# Patient Record
Sex: Female | Born: 1935 | Race: Black or African American | Hispanic: No | Smoking: Never smoker
Health system: Southern US, Community
[De-identification: ages and names within clinical notes are randomized; demographics above are authoritative.]

## PROBLEM LIST (undated history)

## (undated) DIAGNOSIS — F0150 Vascular dementia without behavioral disturbance: Secondary | ICD-10-CM

## (undated) DIAGNOSIS — H9190 Unspecified hearing loss, unspecified ear: Secondary | ICD-10-CM

## (undated) DIAGNOSIS — R569 Unspecified convulsions: Secondary | ICD-10-CM

## (undated) DIAGNOSIS — I10 Essential (primary) hypertension: Secondary | ICD-10-CM

## (undated) DIAGNOSIS — R634 Abnormal weight loss: Secondary | ICD-10-CM

## (undated) DIAGNOSIS — F0390 Unspecified dementia without behavioral disturbance: Secondary | ICD-10-CM

---

## 2011-11-29 DEATH — deceased

## 2015-04-20 ENCOUNTER — Emergency Department (HOSPITAL_COMMUNITY)
Admission: EM | Admit: 2015-04-20 | Discharge: 2015-04-22 | Disposition: A | Discharge status: Home / Self Care | Payer: Medicare Other | Attending: Emergency Medicine | Admitting: Emergency Medicine

## 2015-04-20 ENCOUNTER — Encounter (HOSPITAL_COMMUNITY): Payer: Self-pay | Admitting: Emergency Medicine

## 2015-04-20 DIAGNOSIS — G40909 Epilepsy, unspecified, not intractable, without status epilepticus: Secondary | ICD-10-CM | POA: Diagnosis not present

## 2015-04-20 DIAGNOSIS — F0390 Unspecified dementia without behavioral disturbance: Secondary | ICD-10-CM | POA: Diagnosis present

## 2015-04-20 DIAGNOSIS — Z7982 Long term (current) use of aspirin: Secondary | ICD-10-CM | POA: Diagnosis not present

## 2015-04-20 DIAGNOSIS — Z79899 Other long term (current) drug therapy: Secondary | ICD-10-CM | POA: Insufficient documentation

## 2015-04-20 DIAGNOSIS — F0391 Unspecified dementia with behavioral disturbance: Secondary | ICD-10-CM | POA: Diagnosis not present

## 2015-04-20 DIAGNOSIS — H9190 Unspecified hearing loss, unspecified ear: Secondary | ICD-10-CM | POA: Insufficient documentation

## 2015-04-20 DIAGNOSIS — E876 Hypokalemia: Secondary | ICD-10-CM | POA: Diagnosis not present

## 2015-04-20 DIAGNOSIS — Z008 Encounter for other general examination: Secondary | ICD-10-CM | POA: Diagnosis present

## 2015-04-20 DIAGNOSIS — I10 Essential (primary) hypertension: Secondary | ICD-10-CM | POA: Insufficient documentation

## 2015-04-20 HISTORY — DX: Unspecified hearing loss, unspecified ear: H91.90

## 2015-04-20 HISTORY — DX: Unspecified convulsions: R56.9

## 2015-04-20 HISTORY — DX: Unspecified dementia, unspecified severity, without behavioral disturbance, psychotic disturbance, mood disturbance, and anxiety: F03.90

## 2015-04-20 HISTORY — DX: Essential (primary) hypertension: I10

## 2015-04-20 LAB — URINALYSIS, ROUTINE W REFLEX MICROSCOPIC
BILIRUBIN URINE: NEGATIVE
Glucose, UA: NEGATIVE mg/dL
Hgb urine dipstick: NEGATIVE
Ketones, ur: NEGATIVE mg/dL
LEUKOCYTES UA: NEGATIVE
Nitrite: NEGATIVE
Protein, ur: NEGATIVE mg/dL
SPECIFIC GRAVITY, URINE: 1.01 (ref 1.005–1.030)
Urobilinogen, UA: 0.2 mg/dL (ref 0.0–1.0)
pH: 7.5 (ref 5.0–8.0)

## 2015-04-20 LAB — CBC WITH DIFFERENTIAL/PLATELET
Basophils Absolute: 0 10*3/uL (ref 0.0–0.1)
Basophils Relative: 0 % (ref 0–1)
EOS ABS: 0.2 10*3/uL (ref 0.0–0.7)
EOS PCT: 6 % — AB (ref 0–5)
HCT: 33.2 % — ABNORMAL LOW (ref 36.0–46.0)
Hemoglobin: 10.9 g/dL — ABNORMAL LOW (ref 12.0–15.0)
Lymphocytes Relative: 31 % (ref 12–46)
Lymphs Abs: 1.1 10*3/uL (ref 0.7–4.0)
MCH: 30.1 pg (ref 26.0–34.0)
MCHC: 32.8 g/dL (ref 30.0–36.0)
MCV: 91.7 fL (ref 78.0–100.0)
MONO ABS: 0.4 10*3/uL (ref 0.1–1.0)
Monocytes Relative: 10 % (ref 3–12)
NEUTROS PCT: 53 % (ref 43–77)
Neutro Abs: 2 10*3/uL (ref 1.7–7.7)
PLATELETS: 213 10*3/uL (ref 150–400)
RBC: 3.62 MIL/uL — AB (ref 3.87–5.11)
RDW: 13.8 % (ref 11.5–15.5)
WBC: 3.7 10*3/uL — ABNORMAL LOW (ref 4.0–10.5)

## 2015-04-20 LAB — ETHANOL

## 2015-04-20 LAB — COMPREHENSIVE METABOLIC PANEL
ALT: 11 U/L — ABNORMAL LOW (ref 14–54)
ANION GAP: 7 (ref 5–15)
AST: 19 U/L (ref 15–41)
Albumin: 3.4 g/dL — ABNORMAL LOW (ref 3.5–5.0)
Alkaline Phosphatase: 54 U/L (ref 38–126)
BUN: 17 mg/dL (ref 6–20)
CALCIUM: 9 mg/dL (ref 8.9–10.3)
CO2: 30 mmol/L (ref 22–32)
CREATININE: 0.77 mg/dL (ref 0.44–1.00)
Chloride: 103 mmol/L (ref 101–111)
GFR calc Af Amer: >60 mL/min (ref >60)
Glucose, Bld: 90 mg/dL (ref 65–99)
Potassium: 3 mmol/L — ABNORMAL LOW (ref 3.5–5.1)
Sodium: 140 mmol/L (ref 135–145)
TOTAL PROTEIN: 6.7 g/dL (ref 6.5–8.1)
Total Bilirubin: 0.5 mg/dL (ref 0.3–1.2)

## 2015-04-20 LAB — RAPID URINE DRUG SCREEN, HOSP PERFORMED
AMPHETAMINES: NOT DETECTED
BARBITURATES: NOT DETECTED
Benzodiazepines: NOT DETECTED
Cocaine: NOT DETECTED
OPIATES: NOT DETECTED
Tetrahydrocannabinol: NOT DETECTED

## 2015-04-20 MED ORDER — POTASSIUM CHLORIDE CRYS ER 20 MEQ PO TBCR
40.0000 meq | EXTENDED_RELEASE_TABLET | Freq: Once | ORAL | Status: AC
  Administered 2015-04-20: 40 meq via ORAL
  Filled 2015-04-20: qty 2

## 2015-04-20 MED ORDER — CHLORTHALIDONE 25 MG PO TABS
12.5000 mg | ORAL_TABLET | Freq: Every day | ORAL | Status: DC
  Administered 2015-04-21 – 2015-04-22 (×2): 12.5 mg via ORAL
  Filled 2015-04-20 (×2): qty 0.5

## 2015-04-20 MED ORDER — LISINOPRIL 10 MG PO TABS
10.0000 mg | ORAL_TABLET | Freq: Every day | ORAL | Status: DC
  Administered 2015-04-21 – 2015-04-22 (×2): 10 mg via ORAL
  Filled 2015-04-20 (×2): qty 1

## 2015-04-20 MED ORDER — MELATONIN 10 MG PO CAPS: 10.0000 mg | ORAL_CAPSULE | Freq: Every day | ORAL | Status: DC

## 2015-04-20 MED ORDER — LORAZEPAM 0.5 MG PO TABS
0.5000 mg | ORAL_TABLET | Freq: Once | ORAL | Status: AC
  Administered 2015-04-20: 0.5 mg via ORAL
  Filled 2015-04-20 (×2): qty 1

## 2015-04-20 MED ORDER — DOCUSATE SODIUM 100 MG PO CAPS: 100.0000 mg | ORAL_CAPSULE | Freq: Every day | ORAL | Status: DC | PRN

## 2015-04-20 MED ORDER — ASPIRIN 81 MG PO CHEW
81.0000 mg | CHEWABLE_TABLET | Freq: Every day | ORAL | Status: DC
  Administered 2015-04-21 – 2015-04-22 (×2): 81 mg via ORAL
  Filled 2015-04-20 (×2): qty 1

## 2015-04-20 MED ORDER — DOXYCYCLINE HYCLATE 100 MG PO TABS
100.0000 mg | ORAL_TABLET | Freq: Two times a day (BID) | ORAL | Status: DC
  Administered 2015-04-20 – 2015-04-22 (×4): 100 mg via ORAL
  Filled 2015-04-20 (×4): qty 1

## 2015-04-20 MED ORDER — RIVASTIGMINE 9.5 MG/24HR TD PT24
9.5000 mg | MEDICATED_PATCH | Freq: Every day | TRANSDERMAL | Status: DC
  Administered 2015-04-21 – 2015-04-22 (×2): 9.5 mg via TRANSDERMAL
  Filled 2015-04-20 (×3): qty 1

## 2015-04-20 MED ORDER — LEVETIRACETAM 250 MG PO TABS
250.0000 mg | ORAL_TABLET | Freq: Two times a day (BID) | ORAL | Status: DC
  Administered 2015-04-20 – 2015-04-22 (×4): 250 mg via ORAL
  Filled 2015-04-20 (×5): qty 1

## 2015-04-20 NOTE — ED Notes (Signed)
Pt belongings locked in [place] [address]: 2 shirts 1 pair of pants 1 pair of tennis shoes 1 bra

## 2015-04-20 NOTE — ED Notes (Signed)
Bed: WG95 Expected date:  Expected time:  Means of arrival:  Comments: Bed 10

## 2015-04-20 NOTE — Progress Notes (Signed)
PHARMACIST - PHYSICIAN ORDER COMMUNICATION  CONCERNING: P&T Medication Policy on Herbal Medications  DESCRIPTION:  This patient's order for:  Melatonin  has been noted.  This product(s) is classified as an "herbal" or natural product. Due to a lack of definitive safety studies or FDA approval, nonstandard manufacturing practices, plus the potential risk of unknown drug-drug interactions while on inpatient medications, the Pharmacy and Therapeutics Committee does not permit the use of "herbal" or natural products of this type within [facility].   ACTION TAKEN: The pharmacy department is unable to verify this order at this time and your patient has been informed of this safety policy. Please reevaluate patient's clinical condition at discharge and address if the herbal or natural product(s) should be resumed at that time.  Junita Push, PharmD, BCPS 04/20/2015@12 :44 PM

## 2015-04-20 NOTE — ED Provider Notes (Signed)
CSN: [id number]     Arrival date & time 04/20/15  4098 History   First MD Initiated Contact with Patient 04/20/15 0932     Chief Complaint  Patient presents with  . Psychiatric Evaluation   HPI The patient was brought into the emergency room for evaluation of the patient is a resident of a nursing home. She does have a history of dementia. This morning the staff found the patient barricaded in her bedroom by locking the doors putting chairs in front of her door. Staff had to take the doorknob off in order to gain access to her room. They sent her in for a psychiatric evaluation. Patient denies any complaints. She is unable to tell me about the events that occurred this morning. Patient denies any trouble with any headache. No fevers or chills. Past Medical History  Diagnosis Date  . Hypertension   . Dementia   . Seizures   . Hearing loss    History reviewed. No pertinent past surgical history. History reviewed. No pertinent family history. History  Substance Use Topics  . Smoking status: Never Smoker   . Smokeless tobacco: Not on file  . Alcohol Use: No   OB History    No data available     Review of Systems  All other systems reviewed and are negative.     Allergies  Review of patient's allergies indicates no known allergies.  Home Medications   Prior to Admission medications   Medication Sig Start Date End Date Taking? Authorizing Provider  aspirin 81 MG tablet Take 81 mg by mouth daily.   Yes Historical Provider, MD  chlorthalidone (HYGROTON) 25 MG tablet Take 12.5 mg by mouth every morning.   Yes Historical Provider, MD  docusate sodium (COLACE) 100 MG capsule Take 100 mg by mouth daily as needed for mild constipation or moderate constipation.   Yes Historical Provider, MD  doxycycline (VIBRAMYCIN) 100 MG capsule Take 100 mg by mouth 2 (two) times daily. Taking for 10 days; started 04/18/15.   Yes Historical Provider, MD  levETIRAcetam (KEPPRA) 250 MG tablet Take 250 mg  by mouth 2 (two) times daily.   Yes Historical Provider, MD  lisinopril (PRINIVIL,ZESTRIL) 10 MG tablet Take 10 mg by mouth daily.   Yes Historical Provider, MD  Melatonin 10 MG CAPS Take 10 mg by mouth at bedtime.   Yes Historical Provider, MD  Multiple Vitamin (MULTIVITAMIN) tablet Take 1 tablet by mouth daily.   Yes Historical Provider, MD  OVER THE COUNTER MEDICATION Take 1 Container by mouth at bedtime. Med Pass 2.0   Yes Historical Provider, MD  rivastigmine (EXELON) 9.5 mg/24hr Place 9.5 mg onto the skin daily.   Yes Historical Provider, MD   BP 111/61 mmHg  Pulse 58  Temp(Src) 97.7 F (36.5 C) (Oral)  Resp 16  SpO2 98% Physical Exam  Constitutional: No distress.  Elderly, smiling, pleasant, cooperative  HENT:  Head: Normocephalic and atraumatic.  Right Ear: External ear normal.  Left Ear: External ear normal.  Eyes: Conjunctivae are normal. Right eye exhibits no discharge. Left eye exhibits no discharge. No scleral icterus.  Neck: Neck supple. No tracheal deviation present.  Cardiovascular: Normal rate, regular rhythm and intact distal pulses.   Pulmonary/Chest: Effort normal and breath sounds normal. No stridor. No respiratory distress. She has no wheezes. She has no rales.  Abdominal: Soft. Bowel sounds are normal. She exhibits no distension. There is no tenderness. There is no rebound and no guarding.  Musculoskeletal: She  exhibits no edema or tenderness.  Neurological: She is alert. She has normal strength. No cranial nerve deficit (no facial droop, extraocular movements intact, no slurred speech) or sensory deficit. She exhibits normal muscle tone. She displays no seizure activity. Coordination normal.  Skin: Skin is warm and dry. No rash noted.  Psychiatric: She has a normal mood and affect. Her affect is not angry and not labile. Her speech is not rapid and/or pressured, not delayed and not slurred. She is not slowed and not withdrawn. She does not exhibit a depressed mood.  She expresses no homicidal and no suicidal ideation.  Nursing note and vitals reviewed.   ED Course  Procedures (including critical care time) Labs Review Labs Reviewed  COMPREHENSIVE METABOLIC PANEL - Abnormal; Notable for the following:    Potassium 3.0 (*)    Albumin 3.4 (*)    ALT 11 (*)    All other components within normal limits  CBC WITH DIFFERENTIAL/PLATELET - Abnormal; Notable for the following:    WBC 3.7 (*)    RBC 3.62 (*)    Hemoglobin 10.9 (*)    HCT 33.2 (*)    Eosinophils Relative 6 (*)    All other components within normal limits  ETHANOL  URINALYSIS, ROUTINE W REFLEX MICROSCOPIC (NOT AT [facility])  URINE RAPID DRUG SCREEN, HOSP PERFORMED   Medications  potassium chloride SA (K-DUR,KLOR-CON) CR tablet 40 mEq (not administered)      MDM   Final diagnoses:  Dementia, with behavioral disturbance  Hypokalemia      1126 Family has been in contact with her ACT team.  They believe that her issues may be more of a compulsion rather than a true conidition with her skin.  I would agree.  Pt's symptoms are escalating.  Her act team is going to investigate about inpatient treatment.  Pt was assessed by the ACT team.  They would like her observed overnight.    Linwood Dibbles, MD 04/20/15 [phone]

## 2015-04-20 NOTE — ED Notes (Signed)
Bedside report received from Elizabeth, RN.  

## 2015-04-20 NOTE — Progress Notes (Signed)
CSW reached out to patient's facility [facility]) for collateral information. CSW spoke with the facility med-tech/ [place].   She states that the pt's baseline is very talkative and that she does not sleep during the night. She says that the pt usually sleeps during the day. Med-tech also states that the pt is hard of hearing.  [employer] informed CSW that the pt has a daughter who visits her every now and then named Kevin Fenton.  Daughter/[name] Pitsenbarger [phone]  Trish Mage [phone] ED CSW 04/20/2015 7:11 PM

## 2015-04-20 NOTE — ED Notes (Signed)
Per PTAR. Pt from [facility] nursing home, hx dementia, oriented per norm according to staff. Pt barricaded herself in her bedroom by locking the door and putting chairs in front. Staff opened door by taking doorknob off. Pt has been calm and pleasant with PTAR and upon arrival.

## 2015-04-20 NOTE — BH Assessment (Addendum)
Tele Assessment Not Isabella [name] is an 81 y.o. female. The Pt was brought in by her facility for barricading herself in the bathroom. Pt has dementia. Pt had a difficulty completing the assessment due to (a) difficulty with comprehension, and (b) signifciant hearing losss. Therapist was unable to obtain adequate information from the Pt to complete the assessment.  Collateral information from RN:  Per PTAR. Pt from [facility] nursing home, hx dementia, oriented per norm according to staff. Pt barricaded herself in her bedroom by locking the door and putting chairs in front. Staff opened door by taking doorknob off. Pt has been calm and pleasant with PTAR and upon arrival.         Writer consulted with Julieanne Cotton, NP. Per Julieanne Cotton Pt should remain the ED for observation. Pt pending am psych evaluation.    Axis I: Dementia Axis II: Deferred Axis III:  Past Medical History  Diagnosis Date  . Hypertension   . Dementia   . Seizures   . Hearing loss    Axis IV: problems related to social environment and problems with access to health care services Axis V: 41-50 serious symptoms  Past Medical History:  Past Medical History  Diagnosis Date  . Hypertension   . Dementia   . Seizures   . Hearing loss     History reviewed. No pertinent past surgical history.  Family History: History reviewed. No pertinent family history.  Social History:  reports that she has never smoked. She does not have any smokeless tobacco history on file. She reports that she does not drink alcohol or use illicit drugs.  Additional Social History:  Alcohol / Drug Use Pain Medications: UTA Prescriptions: UTA Over the Counter: UTA History of alcohol / drug use?: No history of alcohol / drug abuse Longest period of sobriety (when/how long): UTA  CIWA: CIWA-Ar BP: 111/61 mmHg Pulse Rate: (!) 58 COWS:    PATIENT STRENGTHS: (choose at least two) Average or above average intelligence Communication  skills  Allergies: No Known Allergies  Home Medications:  (Not in a hospital admission)  OB/GYN Status:  No LMP recorded.  General Assessment Data Location of Assessment: [facility] ED TTS Assessment: In system Is this a Tele or Face-to-Face Assessment?: Tele Assessment Is this an Initial Assessment or a Re-assessment for this encounter?: Initial Assessment Marital status: Other (comment) (Unknown) Maiden name: NA Is patient pregnant?: No Pregnancy Status: No Living Arrangements: Other (Comment) (rest home) Can pt return to current living arrangement?: Yes Admission Status: Voluntary Is patient capable of signing voluntary admission?: Yes Referral Source: Other (rest home) Insurance type: SP     Crisis Care Plan Living Arrangements: Other (Comment) (rest home) Name of Psychiatrist: NA Name of Therapist: NA  Education Status Is patient currently in school?: No Current Grade: NA Highest grade of school patient has completed: Unknown Name of school: NA Contact person: NA  Risk to self with the past 6 months Suicidal Ideation: No (UTA) Has patient been a risk to self within the past 6 months prior to admission? : Other (comment) (JUTA) Suicidal Intent: No (UTA) Has patient had any suicidal intent within the past 6 months prior to admission? : Other (comment) (UTA) Is patient at risk for suicide?: No Suicidal Plan?: No Has patient had any suicidal plan within the past 6 months prior to admission? : No Access to Means: No What has been your use of drugs/alcohol within the last 12 months?: Unknown Previous Attempts/Gestures: No (UTA) How many times?: 0  Other Self Harm Risks: NA Triggers for Past Attempts: None known Intentional Self Injurious Behavior: None (UTA) Family Suicide History: Unable to assess Recent stressful life event(s): Other (Comment) (UTA) Persecutory voices/beliefs?: No Depression: No Depression Symptoms:  (UTA) Substance abuse history and/or treatment  for substance abuse?: No Suicide prevention information given to non-admitted patients: Not applicable  Risk to Others within the past 6 months Homicidal Ideation: No Does patient have any lifetime risk of violence toward others beyond the six months prior to admission? : Unknown Thoughts of Harm to Others: No Current Homicidal Intent: No Current Homicidal Plan: No Access to Homicidal Means: No Identified Victim: NA History of harm to others?: No Assessment of Violence: None Noted Violent Behavior Description: NA Does patient have access to weapons?: No Criminal Charges Pending?: No Does patient have a court date: No Is patient on probation?: Unknown  Psychosis Hallucinations: None noted (Unknown) Delusions: None noted (Unknown)  Mental Status Report Appearance/Hygiene: Unremarkable Eye Contact: Unable to Assess Motor Activity: Unable to assess Speech: Unable to assess Level of Consciousness: Unable to assess Mood: Other (Comment) (UTA) Affect: Unable to Assess Anxiety Level: None Thought Processes: Unable to Assess Judgement: Unable to Assess Obsessive Compulsive Thoughts/Behaviors: Unable to Assess  Cognitive Functioning Concentration: Unable to Assess Memory: Unable to Assess IQ: Average (Unknown) Insight: Unable to Assess Impulse Control: Unable to Assess Appetite: Fair (Unknown) Weight Loss: 0 Weight Gain: 0 Sleep: Unable to Assess Total Hours of Sleep: 7 Vegetative Symptoms: None  ADLScreening [facility] Assessment Services) Patient's cognitive ability adequate to safely complete daily activities?: Yes Patient able to express need for assistance with ADLs?: Yes Independently performs ADLs?: No  Prior Inpatient Therapy Prior Inpatient Therapy: No Prior Therapy Dates: Unknown Prior Therapy Facilty/Provider(s): NA Reason for Treatment: NA  Prior Outpatient Therapy Prior Outpatient Therapy: No Prior Therapy Dates: Unknown Prior Therapy Facilty/Provider(s):  NA Reason for Treatment: NA Does patient have an ACCT team?: No Does patient have Intensive In-House Services?  : No Does patient have [facility] services? : No Does patient have P4CC services?: No  ADL Screening (condition at time of admission) Patient's cognitive ability adequate to safely complete daily activities?: Yes Is the patient deaf or have difficulty hearing?: Yes Does the patient have difficulty seeing, even when wearing glasses/contacts?: Yes Does the patient have difficulty concentrating, remembering, or making decisions?: Yes Patient able to express need for assistance with ADLs?: Yes Does the patient have difficulty dressing or bathing?: Yes Independently performs ADLs?: No Communication: Dependent Dressing (OT): Dependent Grooming: Dependent Feeding: Dependent Bathing: Dependent Toileting: Dependent In/Out Bed: Dependent Walks in Home: Dependent Does the patient have difficulty walking or climbing stairs?: No Weakness of Legs: None Weakness of Arms/Hands: None       Abuse/Neglect Assessment (Assessment to be complete while patient is alone) Physical Abuse: Denies Verbal Abuse: Denies Sexual Abuse: Denies Exploitation of patient/patient's resources: Denies Self-Neglect: Denies     Merchant navy officer (For Healthcare) Does patient have an advance directive?: No Would patient like information on creating an advanced directive?: No - patient declined information    Additional Information 1:1 In Past 12 Months?: No CIRT Risk: No Elopement Risk: No Does patient have medical clearance?: No     Disposition:  Disposition Initial Assessment Completed for this Encounter: Yes Disposition of Patient: Other dispositions Other disposition(s): Other (Comment)  Shermaine Brigham D 04/20/2015 12:51 PM

## 2015-04-20 NOTE — ED Notes (Signed)
Bed: ZO10 Expected date:  Expected time:  Means of arrival:  Comments: Ems- 81 yo F

## 2015-04-21 DIAGNOSIS — F0391 Unspecified dementia with behavioral disturbance: Secondary | ICD-10-CM | POA: Diagnosis not present

## 2015-04-21 DIAGNOSIS — F0390 Unspecified dementia without behavioral disturbance: Secondary | ICD-10-CM | POA: Diagnosis present

## 2015-04-21 NOTE — ED Notes (Signed)
Pt agitated, pacing back and forth with flight of ideas.

## 2015-04-21 NOTE — Progress Notes (Signed)
Pt asleep until 1400. AM meds held until pt was awake and alert. Pt took meds without complication.

## 2015-04-21 NOTE — Progress Notes (Signed)
CSW reached out to [facility] and spoke with Tonya/ [employer]. CSW informed her that the pt maybe returning back to facility tomorrow if becomes stable.  [employer] states that she will inform the med-tech who takes over for tomorrow.  Trish Mage [phone] ED CSW 04/21/2015 8:49 PM

## 2015-04-21 NOTE — Consult Note (Signed)
[place] [facility]iatry Consult   Reason for Consult:  Dementia by hx,  Referring Physician:  EDP Patient Identification: Isabella [name] MRN:  [id number] Principal Diagnosis: Dementia Diagnosis:   Patient Active Problem List   Diagnosis Date Noted  . Dementia [F03.90] 04/21/2015    Priority: High    Total Time spent with patient: 45 minutes  Subjective:   Isabella [name] is a 81 y.o. female patient admitted with Dementia by hx.  HPI:  81 years old female was evaluated this afternoon for locking herself up in the bathroom of the Nursing home where she is staying.  Patient has a long hx of Dementia and has been doing well. She barricaded herself in her bed room and and placed chairs in front of the door to avoid anybody coming into her room.  Last night staff reported that she got confused for a brief period but later calmed down and slept.  Patient was eating during assessment and did not remember what brought her to the hospital.  She was able to report good sleep and stated that she eats slowly because she does not have teeth.  She informed this  Probation officer that her husband died long time ago.  She denied SI/HI/AVH.  She will be re-evaluated in am and will be sent back to the Nursing home.  HPI Elements:   Location:  Dementia by hx. Quality:  Moderate-severe. Severity:  Moderate-severe. Timing:  acute. Duration:  Chronic mental illness. Context:  Brought in for evaluation of Paranoia..  Past Medical History:  Past Medical History  Diagnosis Date  . Hypertension   . Dementia   . Seizures   . Hearing loss    History reviewed. No pertinent past surgical history. Family History: History reviewed. No pertinent family history. Social History:  History  Alcohol Use No     History  Drug Use No    History   Social History  . Marital Status: Unknown    Spouse Name: N/A  . Number of Children: N/A  . Years of Education: N/A   Social History Main Topics  . Smoking status: Never  Smoker   . Smokeless tobacco: Not on file  . Alcohol Use: No  . Drug Use: No  . Sexual Activity: Not on file   Other Topics Concern  . None   Social History Narrative  . None   Additional Social History:    Pain Medications: UTA Prescriptions: UTA Over the Counter: UTA History of alcohol / drug use?: No history of alcohol / drug abuse Longest period of sobriety (when/how long): UTA                     Allergies:  No Known Allergies  Labs:  Results for orders placed or performed during the hospital encounter of 04/20/15 (from the past 48 hour(s))  Ethanol     Status: None   Collection Time: 04/20/15 10:25 AM  Result Value Ref Range   Alcohol, Ethyl (B) <5 <5 mg/dL    Comment:        LOWEST DETECTABLE LIMIT FOR SERUM ALCOHOL IS 5 mg/dL FOR MEDICAL PURPOSES ONLY   Comprehensive metabolic panel     Status: Abnormal   Collection Time: 04/20/15 10:25 AM  Result Value Ref Range   Sodium 140 135 - 145 mmol/L   Potassium 3.0 (L) 3.5 - 5.1 mmol/L   Chloride 103 101 - 111 mmol/L   CO2 30 22 - 32 mmol/L   Glucose, Bld  90 65 - 99 mg/dL   BUN 17 6 - 20 mg/dL   Creatinine, Ser 0.77 0.44 - 1.00 mg/dL   Calcium 9.0 8.9 - 10.3 mg/dL   Total Protein 6.7 6.5 - 8.1 g/dL   Albumin 3.4 (L) 3.5 - 5.0 g/dL   AST 19 15 - 41 U/L   ALT 11 (L) 14 - 54 U/L   Alkaline Phosphatase 54 38 - 126 U/L   Total Bilirubin 0.5 0.3 - 1.2 mg/dL   GFR calc non Af Amer >60 >60 mL/min   GFR calc Af Amer >60 >60 mL/min    Comment: (NOTE) The eGFR has been calculated using the CKD EPI equation. This calculation has not been validated in all clinical situations. eGFR's persistently <60 mL/min signify possible Chronic Kidney Disease.    Anion gap 7 5 - 15  Urinalysis, Routine w reflex microscopic (not at [facility])     Status: None   Collection Time: 04/20/15 11:25 AM  Result Value Ref Range   Color, Urine YELLOW YELLOW   APPearance CLEAR CLEAR   Specific Gravity, Urine 1.010 1.005 - 1.030   pH  7.5 5.0 - 8.0   Glucose, UA NEGATIVE NEGATIVE mg/dL   Hgb urine dipstick NEGATIVE NEGATIVE   Bilirubin Urine NEGATIVE NEGATIVE   Ketones, ur NEGATIVE NEGATIVE mg/dL   Protein, ur NEGATIVE NEGATIVE mg/dL   Urobilinogen, UA 0.2 0.0 - 1.0 mg/dL   Nitrite NEGATIVE NEGATIVE   Leukocytes, UA NEGATIVE NEGATIVE    Comment: MICROSCOPIC NOT DONE ON URINES WITH NEGATIVE PROTEIN, BLOOD, LEUKOCYTES, NITRITE, OR GLUCOSE <1000 mg/dL.  Urine rapid drug screen (hosp performed)     Status: None   Collection Time: 04/20/15 11:25 AM  Result Value Ref Range   Opiates NONE DETECTED NONE DETECTED   Cocaine NONE DETECTED NONE DETECTED   Benzodiazepines NONE DETECTED NONE DETECTED   Amphetamines NONE DETECTED NONE DETECTED   Tetrahydrocannabinol NONE DETECTED NONE DETECTED   Barbiturates NONE DETECTED NONE DETECTED    Comment:        DRUG SCREEN FOR MEDICAL PURPOSES ONLY.  IF CONFIRMATION IS NEEDED FOR ANY PURPOSE, NOTIFY LAB WITHIN 5 DAYS.        LOWEST DETECTABLE LIMITS FOR URINE DRUG SCREEN Drug Class       Cutoff (ng/mL) Amphetamine      1000 Barbiturate      200 Benzodiazepine   829 Tricyclics       937 Opiates          300 Cocaine          300 THC              50   CBC with Differential     Status: Abnormal   Collection Time: 04/20/15 11:54 AM  Result Value Ref Range   WBC 3.7 (L) 4.0 - 10.5 K/uL   RBC 3.62 (L) 3.87 - 5.11 MIL/uL   Hemoglobin 10.9 (L) 12.0 - 15.0 g/dL   HCT 33.2 (L) 36.0 - 46.0 %   MCV 91.7 78.0 - 100.0 fL   MCH 30.1 26.0 - 34.0 pg   MCHC 32.8 30.0 - 36.0 g/dL   RDW 13.8 11.5 - 15.5 %   Platelets 213 150 - 400 K/uL   Neutrophils Relative % 53 43 - 77 %   Neutro Abs 2.0 1.7 - 7.7 K/uL   Lymphocytes Relative 31 12 - 46 %   Lymphs Abs 1.1 0.7 - 4.0 K/uL   Monocytes Relative 10 3 -  12 %   Monocytes Absolute 0.4 0.1 - 1.0 K/uL   Eosinophils Relative 6 (H) 0 - 5 %   Eosinophils Absolute 0.2 0.0 - 0.7 K/uL   Basophils Relative 0 0 - 1 %   Basophils Absolute 0.0 0.0 -  0.1 K/uL    Vitals: Blood pressure 115/75, pulse 65, temperature 97.9 F (36.6 C), temperature source Oral, resp. rate 16, SpO2 100 %.  Risk to Self: Suicidal Ideation: No (UTA) Suicidal Intent: No (UTA) Is patient at risk for suicide?: No Suicidal Plan?: No Access to Means: No What has been your use of drugs/alcohol within the last 12 months?: Unknown How many times?: 0 Other Self Harm Risks: NA Triggers for Past Attempts: None known Intentional Self Injurious Behavior: None (UTA) Risk to Others: Homicidal Ideation: No Thoughts of Harm to Others: No Current Homicidal Intent: No Current Homicidal Plan: No Access to Homicidal Means: No Identified Victim: NA History of harm to others?: No Assessment of Violence: None Noted Violent Behavior Description: NA Does patient have access to weapons?: No Criminal Charges Pending?: No Does patient have a court date: No Prior Inpatient Therapy: Prior Inpatient Therapy: No Prior Therapy Dates: Unknown Prior Therapy Facilty/Provider(s): NA Reason for Treatment: NA Prior Outpatient Therapy: Prior Outpatient Therapy: No Prior Therapy Dates: Unknown Prior Therapy Facilty/Provider(s): NA Reason for Treatment: NA Does patient have an ACCT team?: No Does patient have Intensive In-House Services?  : No Does patient have [facility] services? : No Does patient have P4CC services?: No  Current Facility-Administered Medications  Medication Dose Route Frequency Provider Last Rate Last Dose  . aspirin chewable tablet 81 mg  81 mg Oral Daily Dorie Rank, MD   81 mg at 04/21/15 1352  . chlorthalidone (HYGROTON) tablet 12.5 mg  12.5 mg Oral Daily Dorie Rank, MD   12.5 mg at 04/21/15 1352  . docusate sodium (COLACE) capsule 100 mg  100 mg Oral Daily PRN Dorie Rank, MD      . doxycycline (VIBRA-TABS) tablet 100 mg  100 mg Oral BID Dorie Rank, MD   100 mg at 04/21/15 1353  . levETIRAcetam (KEPPRA) tablet 250 mg  250 mg Oral BID Dorie Rank, MD   250 mg at  04/21/15 1353  . lisinopril (PRINIVIL,ZESTRIL) tablet 10 mg  10 mg Oral Daily Dorie Rank, MD   10 mg at 04/21/15 1355  . rivastigmine (EXELON) 9.5 mg/24hr 9.5 mg  9.5 mg Transdermal Daily Dorie Rank, MD   9.5 mg at 04/21/15 1352   Current Outpatient Prescriptions  Medication Sig Dispense Refill  . aspirin 81 MG tablet Take 81 mg by mouth daily.    . chlorthalidone (HYGROTON) 25 MG tablet Take 12.5 mg by mouth every morning.    . docusate sodium (COLACE) 100 MG capsule Take 100 mg by mouth daily as needed for mild constipation or moderate constipation.    Marland Kitchen doxycycline (VIBRAMYCIN) 100 MG capsule Take 100 mg by mouth 2 (two) times daily. Taking for 10 days; started 04/18/15.    Marland Kitchen levETIRAcetam (KEPPRA) 250 MG tablet Take 250 mg by mouth 2 (two) times daily.    Marland Kitchen lisinopril (PRINIVIL,ZESTRIL) 10 MG tablet Take 10 mg by mouth daily.    . Melatonin 10 MG CAPS Take 10 mg by mouth at bedtime.    . Multiple Vitamin (MULTIVITAMIN) tablet Take 1 tablet by mouth daily.    Marland Kitchen OVER THE COUNTER MEDICATION Take 1 Container by mouth at bedtime. Med Pass 2.0    . rivastigmine (  EXELON) 9.5 mg/24hr Place 9.5 mg onto the skin daily.      Musculoskeletal: Strength & Muscle Tone: within normal limits Gait & Station: normal Patient leans: N/A  Psychiatric Specialty Exam: Physical Exam  Review of Systems  Unable to perform ROS: dementia    Blood pressure 115/75, pulse 65, temperature 97.9 F (36.6 C), temperature source Oral, resp. rate 16, SpO2 100 %.There is no height or weight on file to calculate BMI.  General Appearance: Casual and Fairly Groomed  Eye Contact::  Good  Speech:  Clear and Coherent and Normal Rate  Volume:  Normal  Mood:  Euthymic and pleasant, smiling  Affect:  Appropriate and Congruent  Thought Process:  Coherent  Orientation:  Other:  oriented to name only  Thought Content:  WDL  Suicidal Thoughts:  No  Homicidal Thoughts:  No  Memory:  Immediate;   fair, secondary to  dementia Recent;   Poor Remote;   Poor  Judgement:  Other:  fair to poor judgement secondry to dementia  Insight:  Lacking  Psychomotor Activity:  Normal  Concentration:  Fair  Recall:  Poor  Fund of Knowledge:Poor  Language: Fair  Akathisia:  NA  Handed:  Right  AIMS (if indicated):     Assets:  Desire for Improvement  ADL's:  Intact  Cognition: Impaired,  Moderate  Sleep:      Medical Decision Making: Review of Psycho-Social Stressors (1)  Treatment Plan Summary: Observe overnight and discharge back to her facility in am.  Plan:  Resume all home medications. Disposition: Observe overnight, re-evaluate in am.  Delfin Gant    PMHNP-BC 04/21/2015 2:54 PM Patient seen face-to-face for psychiatric evaluation, chart reviewed and case discussed with the physician extender and developed treatment plan. Reviewed the information documented and agree with the treatment plan. Corena Pilgrim, MD

## 2015-04-22 DIAGNOSIS — F0390 Unspecified dementia without behavioral disturbance: Secondary | ICD-10-CM

## 2015-04-22 DIAGNOSIS — F0391 Unspecified dementia with behavioral disturbance: Secondary | ICD-10-CM | POA: Diagnosis not present

## 2015-04-22 MED ORDER — LORAZEPAM 1 MG PO TABS
1.0000 mg | ORAL_TABLET | Freq: Once | ORAL | Status: AC
  Administered 2015-04-22: 1 mg via ORAL
  Filled 2015-04-22: qty 1

## 2015-04-22 NOTE — ED Provider Notes (Signed)
Pt becoming agitated She is up walking around and yelling She was directed back to bed Ativan ordered Pt stable at this time   Zadie Rhine, MD 04/22/15 [phone]

## 2015-04-22 NOTE — Consult Note (Signed)
[facility] Face-to-Face Psychiatry Consult   Reason for Consult:  Dementia by hx,  Referring Physician:  EDP Patient Identification: Isabella [name] MRN:  [id number] Principal Diagnosis: Dementia Diagnosis:   Patient Active Problem List   Diagnosis Date Noted  . Dementia [F03.90] 04/21/2015    Priority: High    Total Time spent with patient: 20 minutes  Subjective:   Isabella [name] is a 81 y.o. female patient admitted with Dementia by hx.  HPI:  81 years old female was evaluated this afternoon for locking herself up in the bathroom of the Nursing home where she is staying.  Patient has a long hx of Dementia and has been doing well. She barricaded herself in her bed room and and placed chairs in front of the door to avoid anybody coming into her room.  Last night staff reported that she got confused for a brief period but later calmed down and slept.  Patient was eating during assessment and did not remember what brought her to the hospital.  She was able to report good sleep and stated that she eats slowly because she does not have teeth.  She informed this  Clinical research associate that her husband died long time ago.  She denied SI/HI/AVH.  She will be re-evaluated in am and will be sent back to the Nursing home.   Today patient was sleeping on rounds but easily aroused.  She was awake for an a hour at night time but started sleeping this am.  Patient is mixing up her days and nights but is not agitated.  Patient remains calm and cooperative.  She is discharged back to nursing home.  HPI Elements:   Location:  Dementia by hx. Quality:  Moderate-severe. Severity:  Moderate-severe. Timing:  acute. Duration:  Chronic mental illness. Context:  Brought in for evaluation of Paranoia..  Past Medical History:  Past Medical History  Diagnosis Date  . Hypertension   . Dementia   . Seizures   . Hearing loss    History reviewed. No pertinent past surgical history. Family History: History reviewed. No pertinent family  history. Social History:  History  Alcohol Use No     History  Drug Use No    History   Social History  . Marital Status: Unknown    Spouse Name: N/A  . Number of Children: N/A  . Years of Education: N/A   Social History Main Topics  . Smoking status: Never Smoker   . Smokeless tobacco: Not on file  . Alcohol Use: No  . Drug Use: No  . Sexual Activity: Not on file   Other Topics Concern  . None   Social History Narrative  . None   Additional Social History:    Pain Medications: UTA Prescriptions: UTA Over the Counter: UTA History of alcohol / drug use?: No history of alcohol / drug abuse Longest period of sobriety (when/how long): UTA                     Allergies:  No Known Allergies  Labs:  No results found for this or any previous visit (from the past 48 hour(s)).  Vitals: Blood pressure 107/81, pulse 62, temperature 98.7 F (37.1 C), temperature source Oral, resp. rate 18, SpO2 97 %.  Risk to Self: Suicidal Ideation: No (UTA) Suicidal Intent: No (UTA) Is patient at risk for suicide?: No Suicidal Plan?: No Access to Means: No What has been your use of drugs/alcohol within the last 12 months?: Unknown How  many times?: 0 Other Self Harm Risks: NA Triggers for Past Attempts: None known Intentional Self Injurious Behavior: None (UTA) Risk to Others: Homicidal Ideation: No Thoughts of Harm to Others: No Current Homicidal Intent: No Current Homicidal Plan: No Access to Homicidal Means: No Identified Victim: NA History of harm to others?: No Assessment of Violence: None Noted Violent Behavior Description: NA Does patient have access to weapons?: No Criminal Charges Pending?: No Does patient have a court date: No Prior Inpatient Therapy: Prior Inpatient Therapy: No Prior Therapy Dates: Unknown Prior Therapy Facilty/Provider(s): NA Reason for Treatment: NA Prior Outpatient Therapy: Prior Outpatient Therapy: No Prior Therapy Dates:  Unknown Prior Therapy Facilty/Provider(s): NA Reason for Treatment: NA Does patient have an ACCT team?: No Does patient have Intensive In-House Services?  : No Does patient have [facility] services? : No Does patient have P4CC services?: No  Current Facility-Administered Medications  Medication Dose Route Frequency Provider Last Rate Last Dose  . aspirin chewable tablet 81 mg  81 mg Oral Daily Linwood Dibbles, MD   81 mg at 04/22/15 1012  . chlorthalidone (HYGROTON) tablet 12.5 mg  12.5 mg Oral Daily Linwood Dibbles, MD   12.5 mg at 04/22/15 1012  . docusate sodium (COLACE) capsule 100 mg  100 mg Oral Daily PRN Linwood Dibbles, MD      . doxycycline (VIBRA-TABS) tablet 100 mg  100 mg Oral BID Linwood Dibbles, MD   100 mg at 04/22/15 1012  . levETIRAcetam (KEPPRA) tablet 250 mg  250 mg Oral BID Linwood Dibbles, MD   250 mg at 04/22/15 1012  . lisinopril (PRINIVIL,ZESTRIL) tablet 10 mg  10 mg Oral Daily Linwood Dibbles, MD   10 mg at 04/22/15 1012  . rivastigmine (EXELON) 9.5 mg/24hr 9.5 mg  9.5 mg Transdermal Daily Linwood Dibbles, MD   9.5 mg at 04/22/15 1013   Current Outpatient Prescriptions  Medication Sig Dispense Refill  . aspirin 81 MG tablet Take 81 mg by mouth daily.    . chlorthalidone (HYGROTON) 25 MG tablet Take 12.5 mg by mouth every morning.    . docusate sodium (COLACE) 100 MG capsule Take 100 mg by mouth daily as needed for mild constipation or moderate constipation.    Marland Kitchen doxycycline (VIBRAMYCIN) 100 MG capsule Take 100 mg by mouth 2 (two) times daily. Taking for 10 days; started 04/18/15.    Marland Kitchen levETIRAcetam (KEPPRA) 250 MG tablet Take 250 mg by mouth 2 (two) times daily.    Marland Kitchen lisinopril (PRINIVIL,ZESTRIL) 10 MG tablet Take 10 mg by mouth daily.    . Melatonin 10 MG CAPS Take 10 mg by mouth at bedtime.    . Multiple Vitamin (MULTIVITAMIN) tablet Take 1 tablet by mouth daily.    Marland Kitchen OVER THE COUNTER MEDICATION Take 1 Container by mouth at bedtime. Med Pass 2.0    . rivastigmine (EXELON) 9.5 mg/24hr Place 9.5 mg onto the  skin daily.      Musculoskeletal: Strength & Muscle Tone: within normal limits Gait & Station: normal Patient leans: N/A  Psychiatric Specialty Exam: Physical Exam  Review of Systems  Unable to perform ROS: dementia    Blood pressure 107/81, pulse 62, temperature 98.7 F (37.1 C), temperature source Oral, resp. rate 18, SpO2 97 %.There is no height or weight on file to calculate BMI.  General Appearance: Casual and Fairly Groomed  Eye Contact::  Good  Speech:  Clear and Coherent and Normal Rate  Volume:  Normal  Mood:  Euthymic and pleasant, smiling  Affect:  Appropriate and Congruent  Thought Process:  Coherent  Orientation:  Other:  oriented to name only  Thought Content:  WDL  Suicidal Thoughts:  No  Homicidal Thoughts:  No  Memory:  Immediate;   fair, secondary to dementia Recent;   Poor Remote;   Poor  Judgement:  Other:  fair to poor judgement secondry to dementia  Insight:  Lacking  Psychomotor Activity:  Normal  Concentration:  Fair  Recall:  Poor  Fund of Knowledge:Poor  Language: Fair  Akathisia:  NA  Handed:  Right  AIMS (if indicated):     Assets:  Desire for Improvement  ADL's:  Intact  Cognition: Impaired,  Moderate  Sleep:      Medical Decision Making: Established Problem, Stable/Improving (1)  Disposition: Discharge home to Nursing home.  Earney Navy    PMHNP-BC 04/22/2015 2:19 PM  Patient seen face-to-face for psychiatric evaluation along with psychiatric nurse practitioner and case discussed with the treatment team. Formulated treatment plan and reviewed the information documented and agree with the treatment plan.  Briawna Carver,JANARDHAHA R. 04/23/2015 7:23 PM

## 2015-04-22 NOTE — ED Notes (Signed)
Pt currently sleeping at present time; sitter reports pt just feel asleep at 0600 and did not sleep throughout the night. Will allow pt to continue to rest; breathes equal and unlabored. Pt appears comfortable.

## 2015-04-22 NOTE — ED Notes (Signed)
Patient refusing vital signs at this time. Report called to nurse Arta Bruce at [facility]. No futher questions at this timer. [facility] notified for transport.

## 2015-04-22 NOTE — ED Notes (Signed)
Pt was left alone "yelling out 'words of wisdom'" but yelling out continues, disturbing other patients.  Security in vicinity but pt remains very loud.

## 2015-04-22 NOTE — Progress Notes (Signed)
12:45pm. CSW communicated with Shakeeria Right at [facility], informing her that pt has been medically cleared. Right in agreement--they will accept pt back this PM as soon as d/c paperwork complete. CSW completed [facility] transport form.  York Spaniel [facility] Clinical Social Worker [facility] phone: [phone]

## 2015-04-22 NOTE — ED Notes (Signed)
2 bags of pt belongings placed in [facility].

## 2015-04-22 NOTE — ED Notes (Signed)
Pt attempted to go to other pt's room several times, stating, "I'm looking for my matching shoes".  Pt was reoriented and redirected but with some difficulty, pt stating "I have to".

## 2015-04-30 ENCOUNTER — Emergency Department (HOSPITAL_COMMUNITY): Payer: Medicare Other

## 2015-04-30 ENCOUNTER — Encounter (HOSPITAL_COMMUNITY): Payer: Self-pay | Admitting: Emergency Medicine

## 2015-04-30 ENCOUNTER — Emergency Department (HOSPITAL_COMMUNITY)
Admission: EM | Admit: 2015-04-30 | Discharge: 2015-04-30 | Disposition: A | Discharge status: Home / Self Care | Payer: Medicare Other | Attending: Emergency Medicine | Admitting: Emergency Medicine

## 2015-04-30 DIAGNOSIS — Z7982 Long term (current) use of aspirin: Secondary | ICD-10-CM | POA: Insufficient documentation

## 2015-04-30 DIAGNOSIS — G40909 Epilepsy, unspecified, not intractable, without status epilepticus: Secondary | ICD-10-CM | POA: Diagnosis not present

## 2015-04-30 DIAGNOSIS — Z79899 Other long term (current) drug therapy: Secondary | ICD-10-CM | POA: Diagnosis not present

## 2015-04-30 DIAGNOSIS — Y999 Unspecified external cause status: Secondary | ICD-10-CM | POA: Diagnosis not present

## 2015-04-30 DIAGNOSIS — Y939 Activity, unspecified: Secondary | ICD-10-CM | POA: Insufficient documentation

## 2015-04-30 DIAGNOSIS — I10 Essential (primary) hypertension: Secondary | ICD-10-CM | POA: Insufficient documentation

## 2015-04-30 DIAGNOSIS — S01511A Laceration without foreign body of lip, initial encounter: Secondary | ICD-10-CM

## 2015-04-30 DIAGNOSIS — Y929 Unspecified place or not applicable: Secondary | ICD-10-CM | POA: Insufficient documentation

## 2015-04-30 DIAGNOSIS — W1839XA Other fall on same level, initial encounter: Secondary | ICD-10-CM | POA: Insufficient documentation

## 2015-04-30 DIAGNOSIS — F0390 Unspecified dementia without behavioral disturbance: Secondary | ICD-10-CM | POA: Insufficient documentation

## 2015-04-30 LAB — URINALYSIS, ROUTINE W REFLEX MICROSCOPIC
Bilirubin Urine: NEGATIVE
Glucose, UA: NEGATIVE mg/dL
Hgb urine dipstick: NEGATIVE
Ketones, ur: NEGATIVE mg/dL
Nitrite: NEGATIVE
Protein, ur: NEGATIVE mg/dL
Specific Gravity, Urine: 1.03 (ref 1.005–1.030)
Urobilinogen, UA: 0.2 mg/dL (ref 0.0–1.0)
pH: 6.5 (ref 5.0–8.0)

## 2015-04-30 LAB — URINE MICROSCOPIC-ADD ON

## 2015-04-30 MED ORDER — ACETAMINOPHEN 325 MG PO TABS
650.0000 mg | ORAL_TABLET | Freq: Once | ORAL | Status: AC
  Administered 2015-04-30: 650 mg via ORAL
  Filled 2015-04-30: qty 2

## 2015-04-30 NOTE — ED Notes (Signed)
Pt refused in and out cath

## 2015-04-30 NOTE — Discharge Instructions (Signed)
Your lip laceration is not amenable to suturing.  Please use mouthwash daily.  Take tylenol as needed for pain.  Follow up with your doctor for further care.  Non-Sutured Laceration A laceration is a cut or wound that goes through all layers of the skin and into the tissue just beneath the skin. Usually, these are stitched up or held together with tape or glue shortly after the injury occurred. However, if several or more hours have passed before getting care, too many germs (bacteria) get into the laceration. Stitching it closed would bring the risk of infection. If your health care provider feels your laceration is too old, it may be left open and then bandaged to allow healing from the bottom layer up. HOME CARE INSTRUCTIONS   Change the bandage (dressing) 2 times a day or as directed by your health care provider.  If the dressing or packing gauze sticks, soak it off with soapy water.  When you re-bandage your laceration, make sure that the dressing or packing gauze goes all the way to the bottom of the laceration. The top of the laceration is kept open so it can heal from the bottom up. There is less chance for infection with this method.  Wash the area with soap and water 2 times a day to remove all the creams or ointments, if used. Rinse off the soap. Pat the area dry with a clean towel. Look for signs of infection, such as redness, swelling, or a red line that goes away from the laceration.  Re-apply creams or ointments if they were used to bandage the laceration. This helps keep the bandage from sticking.  If the bandage becomes wet, dirty, or has a bad smell, change it as soon as possible.  Only take medicine as directed by your health care provider. You might need a tetanus shot now if:  You have no idea when you had the last one.  You have never had a tetanus shot before.  Your laceration had dirt in it.  Your laceration was dirty, and your last tetanus shot was more than 7  years ago.  Your laceration was clean, and your last tetanus shot was more than 10 years ago. If you need a tetanus shot, and you decide not to get one, there is a rare chance of getting tetanus. Sickness from tetanus can be serious. If you got a tetanus shot, your arm may swell and get red and warm to the touch at the shot site. This is common and not a problem. SEEK MEDICAL CARE IF:   You have redness, swelling, or increasing pain in the laceration.  You notice a red line that goes away from your laceration.  You have pus coming from the laceration.  You have a fever.  You notice a bad smell coming from the laceration or dressing.  You notice something coming out of the laceration, such as wood or glass.  Your laceration is on your hand or foot and you are unable to properly move a finger or toe.  You have severe swelling around the laceration, causing pain and numbness.  You notice a change in color in your arm, hand, leg, or foot. MAKE SURE YOU:   Understand these instructions.  Will watch your condition.  Will get help right away if you are not doing well or get worse. Document Released: 08/14/2006 Document Revised: 09/21/2013 Document Reviewed: 03/06/2009 [facility] Patient Information 2015 [place], [place]. This information is not intended to replace advice  given to you by your health care provider. Make sure you discuss any questions you have with your health care provider.

## 2015-04-30 NOTE — ED Provider Notes (Signed)
  Face-to-face evaluation   History: Patient states that she fell and injured her lip. She is unable to specify when. She presents for evaluation from her nursing home. She has dementia.  Physical exam: Alert, well-appearing elderly female. She is conversant. There is a laceration of the mucosal aspect of her upper lip with a small gum laceration. There is no facial instability, swelling or abnormal motion of the jaw. She moves all extremities equally. There is no deformity of the large joints.  Medical screening examination/treatment/procedure(s) were conducted as a shared visit with non-physician practitioner(s) and myself.  I personally evaluated the patient during the encounter  Mancel Bale, MD 04/30/15 [id number]

## 2015-04-30 NOTE — ED Notes (Addendum)
Awake. Verbally responsive. A/O x4. Resp even and unlabored. No audible adventitious breath sounds noted. ABC's intact. Pt ambulated to BR with steady gait. 

## 2015-04-30 NOTE — ED Notes (Signed)
Pt arrived via EMS with report of rt  inner lip laceration with controlled bleeding s/p to assisted fall at [facility] on [address]. Pt denies hitting her head, LOC, dizziness/lightheadedness, or visual disturbances. Pt HOH and has hx Dementia.

## 2015-04-30 NOTE — ED Provider Notes (Signed)
CSN: [id number]     Arrival date & time 04/30/15  1700 History   First MD Initiated Contact with Patient 04/30/15 1856     Chief Complaint  Patient presents with  . Lip Laceration     (Consider location/radiation/quality/duration/timing/severity/associated sxs/prior Treatment) HPI   Medication-year-old female with history of dementia, seizure, hypertension brought here via EMS from [facility] for evaluation of a fall. Patient is demented, and is a poor historian. She reportedly injured her lip from a fall. She was sent here for further evaluation. She is unable to tell me when she fell. She does endorse mild tenderness to her lip but currently not complaining of anything else. When asked if she has any headache, neck pain, chest pain, abdominal pain, back pain, or pain to her extremities patient is checked her head. When asked if she had lip pain patient nod her head. She is unable to tell me if she has any precipitating symptoms prior to fall. She does not know last tetanus dose. She is currently not complaining of any headache, fever or chills.  Past Medical History  Diagnosis Date  . Hypertension   . Dementia   . Seizures   . Hearing loss    History reviewed. No pertinent past surgical history. Family History  Problem Relation Age of Onset  . Family history unknown: Yes   History  Substance Use Topics  . Smoking status: Never Smoker   . Smokeless tobacco: Not on file  . Alcohol Use: No   OB History    No data available     Review of Systems  Unable to perform ROS: Dementia      Allergies  Review of patient's allergies indicates no known allergies.  Home Medications   Prior to Admission medications   Medication Sig Start Date End Date Taking? Authorizing Provider  aspirin 81 MG tablet Take 81 mg by mouth daily.   Yes Historical Provider, MD  chlorthalidone (HYGROTON) 25 MG tablet Take 12.5 mg by mouth every morning.   Yes Historical Provider,  MD  docusate sodium (COLACE) 100 MG capsule Take 100 mg by mouth daily.    Yes Historical Provider, MD  ergocalciferol (VITAMIN D2) 50000 UNITS capsule Take 50,000 Units by mouth once a week.   Yes Historical Provider, MD  levETIRAcetam (KEPPRA) 250 MG tablet Take 250 mg by mouth 2 (two) times daily.   Yes Historical Provider, MD  lisinopril (PRINIVIL,ZESTRIL) 10 MG tablet Take 10 mg by mouth daily.   Yes Historical Provider, MD  Melatonin 10 MG CAPS Take 10 mg by mouth at bedtime.   Yes Historical Provider, MD  Multiple Vitamin (MULTIVITAMIN) tablet Take 1 tablet by mouth daily.   Yes Historical Provider, MD  OVER THE COUNTER MEDICATION Take 1 Container by mouth daily. Med Pass 2.0   Yes Historical Provider, MD  rivastigmine (EXELON) 9.5 mg/24hr Place 9.5 mg onto the skin daily.   Yes Historical Provider, MD   BP 126/61 mmHg  Pulse 78  Resp 18  SpO2 99% Physical Exam  Constitutional: She appears well-developed and well-nourished. No distress.  HENT:  Head: Normocephalic and atraumatic.  Patient is edentulous. She has a 3 cm laceration involving the upper lip in the buccal region, with a frenulum tear. Bruising noted to the surrounding area. Laceration does not cross the vermilion border and it is not a through and through laceration. No tongue injury. No significant midface tenderness or malocclusion. No hemotympanum or septal hematoma.  Eyes:  Conjunctivae are normal.  Neck: Neck supple.  Cardiovascular: Normal rate.   Pulmonary/Chest: Effort normal and breath sounds normal. She exhibits no tenderness.  Abdominal: Soft. There is no tenderness.  Musculoskeletal: She exhibits no tenderness.  Neurological: She is alert.  Pleasantly demented, no focal neuro deficit  Skin: No rash noted.  Psychiatric: She has a normal mood and affect.  Nursing note and vitals reviewed.   ED Course  Procedures (including critical care time)  9:28 PM Pt injured her upper lip from a fall.  Laceration not  amenable for suture repair per Dr. Effie Shy.  EKG without acute changes.  Pt ambulate without difficulty, normal orthostasis.      Labs Review Labs Reviewed  URINALYSIS, ROUTINE W REFLEX MICROSCOPIC (NOT AT [facility]) - Abnormal; Notable for the following:    APPearance TURBID (*)    Leukocytes, UA SMALL (*)    All other components within normal limits  URINE MICROSCOPIC-ADD ON    Imaging Review No results found.   EKG Interpretation   Date/Time:  Sunday April 30 2015 19:23:11 EDT Ventricular Rate:  68 PR Interval:  171 QRS Duration: 88 QT Interval:  432 QTC Calculation: 459 R Axis:   -20 Text Interpretation:  Sinus rhythm Borderline left axis deviation Low  voltage, precordial leads No old tracing to compare Confirmed by [facility]   MD, ELLIOTT [phone]) on 04/30/2015 8:42:32 PM      MDM   Final diagnoses:  Laceration of lip with delay in treatment, initial encounter    BP 122/76 mmHg  Pulse 74  Temp(Src) 97.6 F (36.4 C) (Oral)  Resp 18  SpO2 96%     Fayrene Helper, PA-C 04/30/15 2218  Mancel Bale, MD 04/30/15 2243

## 2015-04-30 NOTE — ED Notes (Signed)
Bed: [facility] Expected date:  Expected time:  Means of arrival:  Comments: Fall-lip lac

## 2015-04-30 NOTE — ED Notes (Signed)
This writer walked patient to the restroom to collect urine sample. A hat was placed in the toilet, Patient missed hat. Patient given something to drink to try again for urine sample.

## 2015-04-30 NOTE — ED Notes (Signed)
[facility] here to transport pt back to [facility].

## 2015-11-22 ENCOUNTER — Emergency Department (HOSPITAL_COMMUNITY)
Admission: EM | Admit: 2015-11-22 | Discharge: 2015-11-22 | Disposition: A | Discharge status: Home / Self Care | Payer: Medicare Other | Attending: Emergency Medicine | Admitting: Emergency Medicine

## 2015-11-22 ENCOUNTER — Emergency Department (HOSPITAL_COMMUNITY): Payer: Medicare Other

## 2015-11-22 ENCOUNTER — Encounter (HOSPITAL_COMMUNITY): Payer: Self-pay | Admitting: Emergency Medicine

## 2015-11-22 DIAGNOSIS — I10 Essential (primary) hypertension: Secondary | ICD-10-CM | POA: Diagnosis not present

## 2015-11-22 DIAGNOSIS — Z7982 Long term (current) use of aspirin: Secondary | ICD-10-CM | POA: Diagnosis not present

## 2015-11-22 DIAGNOSIS — M7981 Nontraumatic hematoma of soft tissue: Secondary | ICD-10-CM | POA: Diagnosis present

## 2015-11-22 DIAGNOSIS — F0390 Unspecified dementia without behavioral disturbance: Secondary | ICD-10-CM | POA: Insufficient documentation

## 2015-11-22 DIAGNOSIS — H9190 Unspecified hearing loss, unspecified ear: Secondary | ICD-10-CM | POA: Diagnosis not present

## 2015-11-22 DIAGNOSIS — Z79899 Other long term (current) drug therapy: Secondary | ICD-10-CM | POA: Insufficient documentation

## 2015-11-22 DIAGNOSIS — R58 Hemorrhage, not elsewhere classified: Secondary | ICD-10-CM

## 2015-11-22 LAB — CBC WITH DIFFERENTIAL/PLATELET
BASOS ABS: 0 10*3/uL (ref 0.0–0.1)
Basophils Relative: 0 %
EOS ABS: 0.3 10*3/uL (ref 0.0–0.7)
EOS PCT: 7 %
HEMATOCRIT: 34.7 % — AB (ref 36.0–46.0)
HEMOGLOBIN: 11.1 g/dL — AB (ref 12.0–15.0)
LYMPHS PCT: 39 %
Lymphs Abs: 1.6 10*3/uL (ref 0.7–4.0)
MCH: 30.2 pg (ref 26.0–34.0)
MCHC: 32 g/dL (ref 30.0–36.0)
MCV: 94.3 fL (ref 78.0–100.0)
Monocytes Absolute: 0.3 10*3/uL (ref 0.1–1.0)
Monocytes Relative: 8 %
NEUTROS PCT: 46 %
Neutro Abs: 1.9 10*3/uL (ref 1.7–7.7)
Platelets: 260 10*3/uL (ref 150–400)
RBC: 3.68 MIL/uL — ABNORMAL LOW (ref 3.87–5.11)
RDW: 13.4 % (ref 11.5–15.5)
WBC: 4 10*3/uL (ref 4.0–10.5)

## 2015-11-22 LAB — PROTIME-INR
INR: 1.18 (ref 0.00–1.49)
PROTHROMBIN TIME: 14.7 s (ref 11.6–15.2)

## 2015-11-22 LAB — BASIC METABOLIC PANEL
Anion gap: 8 (ref 5–15)
BUN: 15 mg/dL (ref 6–20)
CHLORIDE: 101 mmol/L (ref 101–111)
CO2: 30 mmol/L (ref 22–32)
CREATININE: 0.73 mg/dL (ref 0.44–1.00)
Calcium: 9.2 mg/dL (ref 8.9–10.3)
GFR calc non Af Amer: >60 mL/min (ref >60)
Glucose, Bld: 83 mg/dL (ref 65–99)
Potassium: 3.5 mmol/L (ref 3.5–5.1)
Sodium: 139 mmol/L (ref 135–145)

## 2015-11-22 LAB — APTT: aPTT: 29 seconds (ref 24–37)

## 2015-11-22 NOTE — ED Notes (Signed)
PTAR CALLED ETA unknown.

## 2015-11-22 NOTE — ED Provider Notes (Signed)
CSN: [id number]     Arrival date & time 11/22/15  1147 History   First MD Initiated Contact with Patient 11/22/15 1224     Chief Complaint  Patient presents with  . Bleeding/Bruising    right arm     (Consider location/radiation/quality/duration/timing/severity/associated sxs/prior Treatment) HPI   Blood pressure 98/68, pulse 57, temperature 98.1 F (36.7 C), temperature source Oral, resp. rate 14, SpO2 95 %.  Cletus Mehlhoff is a 82 y.o. female are in by EMS for evaluation of bruise to right shoulder.  level V caveat secondary to dementia. As per paperwork patient is mentating at her baseline.Discussed with nursing assitant at [facility] who states she was sent to the emergency room to be evaluated for bruise. No known trauma, head trauma, melena, hematochezia. Patient is not anticoagulated.  Past Medical History  Diagnosis Date  . Hypertension   . Dementia   . Seizures (HCC)   . Hearing loss    History reviewed. No pertinent past surgical history. Family History  Problem Relation Age of Onset  . Family history unknown: Yes   Social History  Substance Use Topics  . Smoking status: Never Smoker   . Smokeless tobacco: None  . Alcohol Use: No   OB History    No data available     Review of Systems  10 systems reviewed and found to be negative, except as noted in the HPI.   Allergies  Review of patient's allergies indicates no known allergies.  Home Medications   Prior to Admission medications   Medication Sig Start Date End Date Taking? Authorizing Provider  aspirin 81 MG tablet Take 81 mg by mouth daily.   Yes Historical Provider, MD  chlorthalidone (HYGROTON) 25 MG tablet Take 12.5 mg by mouth every morning.   Yes Historical Provider, MD  docusate sodium (COLACE) 100 MG capsule Take 100 mg by mouth daily.    Yes Historical Provider, MD  levETIRAcetam (KEPPRA) 250 MG tablet Take 250-500 mg by mouth 2 (two) times daily. Take 500 mg every morning. Take 250 mg every  night at bedtime.   Yes Historical Provider, MD  lisinopril (PRINIVIL,ZESTRIL) 10 MG tablet Take 10 mg by mouth daily.   Yes Historical Provider, MD  LORazepam (ATIVAN) 0.5 MG tablet Take 0.5 mg by mouth 2 (two) times daily.   Yes Historical Provider, MD  Melatonin 10 MG CAPS Take 10 mg by mouth at bedtime.   Yes Historical Provider, MD  mirtazapine (REMERON) 7.5 MG tablet Take 7.5 mg by mouth at bedtime.   Yes Historical Provider, MD  Multiple Vitamin (MULTIVITAMIN) tablet Take 1 tablet by mouth daily.   Yes Historical Provider, MD  OVER THE COUNTER MEDICATION Take 1 Container by mouth daily. Med Pass 2.0   Yes Historical Provider, MD  rivastigmine (EXELON) 9.5 mg/24hr Place 9.5 mg onto the skin daily.   Yes Historical Provider, MD   BP 98/68 mmHg  Pulse 57  Temp(Src) 98.1 F (36.7 C) (Oral)  Resp 14  SpO2 95% Physical Exam  Constitutional: She appears well-developed and well-nourished. No distress.  HENT:  Head: Normocephalic and atraumatic.  Mouth/Throat: Oropharynx is clear and moist.  Eyes: Conjunctivae and EOM are normal. Pupils are equal, round, and reactive to light.  Neck: Normal range of motion.  Cardiovascular: Normal rate, regular rhythm and intact distal pulses.   Pulmonary/Chest: Effort normal and breath sounds normal. No respiratory distress. She has no wheezes. She has no rales. She exhibits no tenderness.  Abdominal: Soft.  Bowel sounds are normal. There is no tenderness.  Musculoskeletal: Normal range of motion. She exhibits no edema or tenderness.  Neurological: She is alert.  Pleasantly demented, not oriented 3.moving all extremities, follows simple commands.  Skin: She is not diaphoretic.  Ecchymosis to right upper arm. No bony tenderness on shoulder elbow, distally neurovascularly intact.   Psychiatric: She has a normal mood and affect.  Nursing note and vitals reviewed.   ED Course  Procedures (including critical care time) Labs Review Labs Reviewed  CBC  WITH DIFFERENTIAL/PLATELET  BASIC METABOLIC PANEL  APTT  PROTIME-INR    Imaging Review No results found. I have personally reviewed and evaluated these images and lab results as part of my medical decision-making.   EKG Interpretation None      MDM   Final diagnoses:  Ecchymosis    Filed Vitals:   11/22/15 1218  BP: 98/68  Pulse: 57  Temp: 98.1 F (36.7 C)  TempSrc: Oral  Resp: 14  SpO2: 95%    Basma Buchner is 82 y.o. female presenting with ecchymoses to right arm, distally neurovascularly intact, no bony tenderness palpation. X-ray negative, no abnormality on blood work including coags.  This is a shared visit with the attending physician who personally evaluated the patient and agrees with the care plan.   Evaluation does not show pathology that would require ongoing emergent intervention or inpatient treatment. Pt is hemodynamically stable and mentating appropriately. Discussed findings and plan with patient/guardian, who agrees with care plan. All questions answered. Return precautions discussed and outpatient follow up given.      Wynetta Emery, PA-C 11/22/15 1506  Lyndal Pulley, MD 11/22/15 2245

## 2015-11-22 NOTE — ED Notes (Addendum)
Per PT[facility] . Pt from [facility], [phone].  staff called for evaluation for right arm bruising. Pt take aspirin daily. Alert and oriented to self to baseline. Per ems per staff pt did not have fall no injury to arm. Per staff pt is ambulatory yet ems reports ambulating with unsteady gait.

## 2016-03-15 ENCOUNTER — Emergency Department (HOSPITAL_COMMUNITY)
Admission: EM | Admit: 2016-03-15 | Discharge: 2016-03-15 | Disposition: A | Discharge status: Home / Self Care | Payer: Medicare Other | Attending: Emergency Medicine | Admitting: Emergency Medicine

## 2016-03-15 ENCOUNTER — Emergency Department (HOSPITAL_COMMUNITY): Payer: Medicare Other

## 2016-03-15 ENCOUNTER — Encounter (HOSPITAL_COMMUNITY): Payer: Self-pay

## 2016-03-15 DIAGNOSIS — G40909 Epilepsy, unspecified, not intractable, without status epilepticus: Secondary | ICD-10-CM | POA: Insufficient documentation

## 2016-03-15 DIAGNOSIS — F0390 Unspecified dementia without behavioral disturbance: Secondary | ICD-10-CM | POA: Insufficient documentation

## 2016-03-15 DIAGNOSIS — Z7982 Long term (current) use of aspirin: Secondary | ICD-10-CM | POA: Diagnosis not present

## 2016-03-15 DIAGNOSIS — I10 Essential (primary) hypertension: Secondary | ICD-10-CM | POA: Insufficient documentation

## 2016-03-15 DIAGNOSIS — R569 Unspecified convulsions: Secondary | ICD-10-CM

## 2016-03-15 DIAGNOSIS — Z79899 Other long term (current) drug therapy: Secondary | ICD-10-CM | POA: Diagnosis not present

## 2016-03-15 LAB — URINALYSIS, ROUTINE W REFLEX MICROSCOPIC
Bilirubin Urine: NEGATIVE
Glucose, UA: NEGATIVE mg/dL
Hgb urine dipstick: NEGATIVE
Ketones, ur: NEGATIVE mg/dL
Leukocytes, UA: NEGATIVE
NITRITE: NEGATIVE
Protein, ur: NEGATIVE mg/dL
SPECIFIC GRAVITY, URINE: 1.018 (ref 1.005–1.030)
pH: 7.5 (ref 5.0–8.0)

## 2016-03-15 LAB — I-STAT CHEM 8, ED
BUN: 16 mg/dL (ref 6–20)
CHLORIDE: 103 mmol/L (ref 101–111)
CREATININE: 0.7 mg/dL (ref 0.44–1.00)
Calcium, Ion: 1.11 mmol/L — ABNORMAL LOW (ref 1.13–1.30)
Glucose, Bld: 94 mg/dL (ref 65–99)
HEMATOCRIT: 36 % (ref 36.0–46.0)
Hemoglobin: 12.2 g/dL (ref 12.0–15.0)
POTASSIUM: 3.3 mmol/L — AB (ref 3.5–5.1)
SODIUM: 141 mmol/L (ref 135–145)
TCO2: 24 mmol/L (ref 0–100)

## 2016-03-15 MED ORDER — SODIUM CHLORIDE 0.9 % IV BOLUS (SEPSIS): 1000.0000 mL | Freq: Once | INTRAVENOUS | Status: DC

## 2016-03-15 MED ORDER — SODIUM CHLORIDE 0.9 % IV BOLUS (SEPSIS)
1000.0000 mL | Freq: Once | INTRAVENOUS | Status: AC
  Administered 2016-03-15: 1000 mL via INTRAVENOUS

## 2016-03-15 NOTE — ED Notes (Signed)
Patient [facility] back [facility]

## 2016-03-15 NOTE — ED Notes (Signed)
Patient transported to CT 

## 2016-03-15 NOTE — ED Notes (Addendum)
[facility]

## 2016-03-15 NOTE — Discharge Instructions (Signed)
As discussed, your evaluation today has been largely reassuring.  But, it is important that you monitor your condition carefully, and do not hesitate to return to the ED if you develop new, or concerning changes in your condition.  Otherwise, please follow-up with your physician for appropriate ongoing care.  Please be sure to discuss your antiseizure medication.

## 2016-03-15 NOTE — ED Notes (Signed)
Charge RN attempting IV stick and will collect blood if able.

## 2016-03-15 NOTE — ED Provider Notes (Signed)
CSN: [id number]     Arrival date & time 03/15/16  0703 History   First MD Initiated Contact with Patient 03/15/16 [phone]     Chief Complaint  Patient presents with  . Seizures  . Fall     (Consider location/radiation/quality/duration/timing/severity/associated sxs/prior Treatment) HPI Patient with a history of dementia and seizures, level V caveat, now presents after a witnessed seizure. Per report the patient was standing upright, when she had a seizure. She apparently fell to the ground. The patient cannot provide any details of this event. Patient presents with cervical collar in place via EMS transport. Patient only complains of being sleepy.  Past Medical History  Diagnosis Date  . Hypertension   . Dementia   . Seizures (HCC)   . Hearing loss    History reviewed. No pertinent past surgical history. Family History  Problem Relation Age of Onset  . Family history unknown: Yes   Social History  Substance Use Topics  . Smoking status: Never Smoker   . Smokeless tobacco: None  . Alcohol Use: No   OB History    No data available     Review of Systems  Unable to perform ROS: Dementia      Allergies  Review of patient's allergies indicates no known allergies.  Home Medications   Prior to Admission medications   Medication Sig Start Date End Date Taking? Authorizing Provider  acetaminophen (TYLENOL) 650 MG CR tablet Take 650 mg by mouth every 8 (eight) hours as needed for pain.   Yes Historical Provider, MD  aspirin 81 MG chewable tablet Chew 81 mg by mouth daily.   Yes Historical Provider, MD  chlorthalidone (HYGROTON) 25 MG tablet Take 12.5 mg by mouth every morning.   Yes Historical Provider, MD  docusate sodium (COLACE) 100 MG capsule Take 100 mg by mouth daily.    Yes Historical Provider, MD  levETIRAcetam (KEPPRA) 250 MG tablet Take 250 mg by mouth 2 (two) times daily. Take 500 mg every morning. Take 250 mg every night at bedtime.   Yes Historical Provider,  MD  LORazepam (ATIVAN) 0.5 MG tablet Take 0.5 mg by mouth 2 (two) times daily. Also takes 0.5 mg as needed for agitation. Last as needed dose was 03/05/16   Yes Historical Provider, MD  Melatonin 3 MG CAPS Take 3 mg by mouth at bedtime.   Yes Historical Provider, MD  mirtazapine (REMERON) 15 MG tablet Take 7.5 mg by mouth at bedtime.   Yes Historical Provider, MD  Multiple Vitamin (MULTIVITAMIN) tablet Take 1 tablet by mouth daily.   Yes Historical Provider, MD  naproxen (NAPROSYN) 375 MG tablet Take 375 mg by mouth 2 (two) times daily as needed for mild pain or moderate pain.   Yes Historical Provider, MD  OVER THE COUNTER MEDICATION Take 1 Container by mouth daily. Med Pass 2.0   Yes Historical Provider, MD  potassium chloride SA (K-DUR,KLOR-CON) 20 MEQ tablet Take 20 mEq by mouth daily.   Yes Historical Provider, MD  risperiDONE (RISPERDAL) 0.5 MG tablet Take 0.5 mg by mouth at bedtime.   Yes Historical Provider, MD  rivastigmine (EXELON) 9.5 mg/24hr Place 9.5 mg onto the skin daily.   Yes Historical Provider, MD   BP 125/78 mmHg  Pulse 78  Temp(Src) 98 F (36.7 C) (Oral)  Resp 12  SpO2 98% Physical Exam  Constitutional: She is easily aroused. She has a sickly appearance.  HENT:  Head: Normocephalic and atraumatic.  Eyes: Conjunctivae and EOM are normal.  Neck:  Cervical collar in place, no gross abnormalities  Cardiovascular: Normal rate and regular rhythm.   Pulmonary/Chest: Effort normal and breath sounds normal. No stridor. No respiratory distress.  Abdominal: She exhibits no distension.  Musculoskeletal: She exhibits no edema.  Neurological: She is easily aroused. She displays atrophy. She displays no tremor. No cranial nerve deficit. She exhibits normal muscle tone. She displays no seizure activity.  Moves all extremity spontaneously, speech is brief, clear, but not appropriate. Patient does not follow neurologic commands consistently  Skin: Skin is warm and dry.  Psychiatric:  She is slowed and withdrawn. Cognition and memory are impaired.  Nursing note and vitals reviewed.   ED Course  Procedures (including critical care time) Labs Review Labs Reviewed  I-STAT CHEM 8, ED - Abnormal; Notable for the following:    Potassium 3.3 (*)    Calcium, Ion 1.11 (*)    All other components within normal limits  URINALYSIS, ROUTINE W REFLEX MICROSCOPIC (NOT [facility])    Imaging Review Ct Head Wo Contrast  03/15/2016  CLINICAL DATA:  Seizure with fall EXAM: CT HEAD WITHOUT CONTRAST CT CERVICAL SPINE WITHOUT CONTRAST TECHNIQUE: Multidetector CT imaging of the head and cervical spine was performed following the standard protocol without intravenous contrast. Multiplanar CT image reconstructions of the cervical spine were also generated. COMPARISON:  None. FINDINGS: CT HEAD FINDINGS There is moderate diffuse atrophy. There is no intracranial mass, hemorrhage, extra-axial fluid collection, or midline shift. There is small vessel disease throughout the centra semiovale bilaterally. There is small vessel disease in both external capsules anteriorly. There is evidence of an age uncertain infarct in the anterior left lentiform nucleus measuring 1.0 x 0.9 cm. Elsewhere gray-white compartments appear normal. Bony calvarium appears intact. Mastoids are virtually aplastic bilaterally. No intraorbital lesions are evident. CT CERVICAL SPINE FINDINGS There is no acute fracture or spondylolisthesis. There is concavity along the superior endplate of T2, likely due to chronic osteoporotic change. Prevertebral soft tissues and predental space regions are normal. There is marked disc space narrowing at C3-4, C4-5, C5-6, and C6-7. There is facet hypertrophy at multiple levels. Exit foraminal narrowing due to bony hypertrophy is most severe at C3-4 on the right, C4-5 bilaterally, and C5-6 bilaterally. There is scarring in the medial right apex region. There are scattered foci of carotid artery calcification  bilaterally. IMPRESSION: CT head: Atrophy with extensive periventricular small vessel disease. Age uncertain small infarct in the anterior left lentiform nucleus. No intracranial mass, hemorrhage, or extra-axial fluid collection. Aplastic mastoids bilaterally. CT cervical spine: No acute appearing fracture or spondylolisthesis. Concavity along the superior aspect of the T2 endplate this likely due to chronic osteoporotic change. There is extensive multilevel osteoarthritic change. There is scarring in the medial right lung apex. There are scattered foci of carotid artery calcification bilaterally. Electronically Signed   By: Bretta BangWilliam  Woodruff III M.D.   On: 03/15/2016 08:34   Ct Cervical Spine Wo Contrast  03/15/2016  CLINICAL DATA:  Seizure with fall EXAM: CT HEAD WITHOUT CONTRAST CT CERVICAL SPINE WITHOUT CONTRAST TECHNIQUE: Multidetector CT imaging of the head and cervical spine was performed following the standard protocol without intravenous contrast. Multiplanar CT image reconstructions of the cervical spine were also generated. COMPARISON:  None. FINDINGS: CT HEAD FINDINGS There is moderate diffuse atrophy. There is no intracranial mass, hemorrhage, extra-axial fluid collection, or midline shift. There is small vessel disease throughout the centra semiovale bilaterally. There is small vessel disease in both external capsules anteriorly. There is  evidence of an age uncertain infarct in the anterior left lentiform nucleus measuring 1.0 x 0.9 cm. Elsewhere gray-white compartments appear normal. Bony calvarium appears intact. Mastoids are virtually aplastic bilaterally. No intraorbital lesions are evident. CT CERVICAL SPINE FINDINGS There is no acute fracture or spondylolisthesis. There is concavity along the superior endplate of T2, likely due to chronic osteoporotic change. Prevertebral soft tissues and predental space regions are normal. There is marked disc space narrowing at C3-4, C4-5, C5-6, and C6-7.  There is facet hypertrophy at multiple levels. Exit foraminal narrowing due to bony hypertrophy is most severe at C3-4 on the right, C4-5 bilaterally, and C5-6 bilaterally. There is scarring in the medial right apex region. There are scattered foci of carotid artery calcification bilaterally. IMPRESSION: CT head: Atrophy with extensive periventricular small vessel disease. Age uncertain small infarct in the anterior left lentiform nucleus. No intracranial mass, hemorrhage, or extra-axial fluid collection. Aplastic mastoids bilaterally. CT cervical spine: No acute appearing fracture or spondylolisthesis. Concavity along the superior aspect of the T2 endplate this likely due to chronic osteoporotic change. There is extensive multilevel osteoarthritic change. There is scarring in the medial right lung apex. There are scattered foci of carotid artery calcification bilaterally. Electronically Signed   By: Bretta Bang III M.D.   On: 03/15/2016 08:34   I have personally reviewed and evaluated these images and lab results as part of my medical decision-making.   EKG Interpretation None     Chart review notable for prior visits following fall, seizure, similar to today's episode.  11:54 AM Patient in no distress, sleeping, results reviewed, all reassuring MDM  Elderly female with history of dementia, seizures presents after a seizure at her nursing facility.  With unclear circumstances the patient had radiographic studies performed in addition to labs. Findings reassuring, patient remained hemodynamically stable, with no additional seizure activity here. Patient discharged in stable condition to follow-up with neurology as an outpatient.  Gerhard Munch, MD 03/15/16 1154

## 2016-03-15 NOTE — ED Notes (Signed)
Pt had a witnessed seizure at SNF lasted 1-2 min. Pt was standing in hall at the time and fell to the ground. C collar in position.  Hx of seizures and dementia.

## 2016-03-15 NOTE — ED Notes (Signed)
Bed: [id number] Expected date:  Expected time:  Means of arrival:  Comments: EMS seizure-from SNF

## 2016-03-15 NOTE — ED Notes (Signed)
IV has been removed pt placed in clothes and is in hallway B waiting for [facility]

## 2016-04-04 ENCOUNTER — Emergency Department (HOSPITAL_COMMUNITY)
Admission: EM | Admit: 2016-04-04 | Discharge: 2016-04-05 | Disposition: A | Discharge status: Home / Self Care | Payer: Medicare Other | Attending: Emergency Medicine | Admitting: Emergency Medicine

## 2016-04-04 ENCOUNTER — Encounter (HOSPITAL_COMMUNITY): Payer: Self-pay | Admitting: Emergency Medicine

## 2016-04-04 DIAGNOSIS — Y939 Activity, unspecified: Secondary | ICD-10-CM | POA: Diagnosis not present

## 2016-04-04 DIAGNOSIS — Y929 Unspecified place or not applicable: Secondary | ICD-10-CM | POA: Insufficient documentation

## 2016-04-04 DIAGNOSIS — Y999 Unspecified external cause status: Secondary | ICD-10-CM | POA: Insufficient documentation

## 2016-04-04 DIAGNOSIS — F0390 Unspecified dementia without behavioral disturbance: Secondary | ICD-10-CM | POA: Diagnosis not present

## 2016-04-04 DIAGNOSIS — Z7982 Long term (current) use of aspirin: Secondary | ICD-10-CM | POA: Diagnosis not present

## 2016-04-04 DIAGNOSIS — Z79899 Other long term (current) drug therapy: Secondary | ICD-10-CM | POA: Insufficient documentation

## 2016-04-04 DIAGNOSIS — W19XXXA Unspecified fall, initial encounter: Secondary | ICD-10-CM | POA: Diagnosis not present

## 2016-04-04 DIAGNOSIS — F0391 Unspecified dementia with behavioral disturbance: Secondary | ICD-10-CM

## 2016-04-04 DIAGNOSIS — I10 Essential (primary) hypertension: Secondary | ICD-10-CM | POA: Insufficient documentation

## 2016-04-04 DIAGNOSIS — Z048 Encounter for examination and observation for other specified reasons: Secondary | ICD-10-CM | POA: Diagnosis present

## 2016-04-04 HISTORY — DX: Vascular dementia, unspecified severity, without behavioral disturbance, psychotic disturbance, mood disturbance, and anxiety: F01.50

## 2016-04-04 HISTORY — DX: Abnormal weight loss: R63.4

## 2016-04-04 NOTE — ED Notes (Signed)
Bed: WA07 Expected date:  Expected time:  Means of arrival:  Comments: seizures

## 2016-04-04 NOTE — ED Notes (Signed)
[facility] presents with a 82 yo patient [facility] found on floor slumped over on floor with what staff thinks was a possible seizure.  Pt has hx of seizure, vascular dementia, and hearing loss.  Pt is usually ambulatory and alert and oriented to self but found unable to ambulate this evening leaning against the wall and slumped over.

## 2016-04-05 NOTE — Discharge Instructions (Signed)
At this time, no injury has been identified. Continue to observe the patient for her baseline function. If area of pain or new finding is identified contact facility provider or return to the emergency department.

## 2016-04-05 NOTE — ED Provider Notes (Signed)
CSN: [id number]     Arrival date & time 04/04/16  2150 History   First MD Initiated Contact with Patient 04/04/16 2246     Chief Complaint  Patient presents with  . Fall  . possible seizure      (Consider location/radiation/quality/duration/timing/severity/associated sxs/prior Treatment) HPI Patient is sent from nursing home for unwitnessed fall. It was suspected possible seizure. Patient has history of similar. She has a history of dementia. At this time, patient is alert without any complaints. She is pleasant and cooperative. She does not endorse pain. She cannot provide history due to dementia. Past Medical History  Diagnosis Date  . Hypertension   . Dementia   . Seizures (HCC)   . Hearing loss   . Weight loss   . Vascular dementia    History reviewed. No pertinent past surgical history. Family History  Problem Relation Age of Onset  . Family history unknown: Yes   Social History  Substance Use Topics  . Smoking status: Never Smoker   . Smokeless tobacco: None  . Alcohol Use: No   OB History    No data available     Review of Systems Cannot obtain review of systems due to patient dementia level V caveat.   Allergies  Review of patient's allergies indicates no known allergies.  Home Medications   Prior to Admission medications   Medication Sig Start Date End Date Taking? Authorizing Provider  acetaminophen (TYLENOL) 650 MG CR tablet Take 650 mg by mouth every 8 (eight) hours as needed for pain.   Yes Historical Provider, MD  aspirin 81 MG chewable tablet Chew 81 mg by mouth daily.   Yes Historical Provider, MD  chlorthalidone (HYGROTON) 25 MG tablet Take 12.5 mg by mouth every morning.   Yes Historical Provider, MD  docusate sodium (COLACE) 100 MG capsule Take 100 mg by mouth daily.    Yes Historical Provider, MD  levETIRAcetam (KEPPRA) 250 MG tablet Take 250 mg by mouth 2 (two) times daily. Take 500 mg every morning. Take 250 mg every night at bedtime.   Yes  Historical Provider, MD  LORazepam (ATIVAN) 0.5 MG tablet Take 0.5 mg by mouth 2 (two) times daily. Also takes 0.5 mg as needed for agitation. Last as needed dose was 03/05/16   Yes Historical Provider, MD  Melatonin 3 MG CAPS Take 3 mg by mouth at bedtime.   Yes Historical Provider, MD  mirtazapine (REMERON) 15 MG tablet Take 7.5 mg by mouth at bedtime.   Yes Historical Provider, MD  Multiple Vitamin (MULTIVITAMIN) tablet Take 1 tablet by mouth daily.   Yes Historical Provider, MD  naproxen (NAPROSYN) 375 MG tablet Take 375 mg by mouth 2 (two) times daily as needed for mild pain or moderate pain.   Yes Historical Provider, MD  potassium chloride SA (K-DUR,KLOR-CON) 20 MEQ tablet Take 20 mEq by mouth daily.   Yes Historical Provider, MD  risperiDONE (RISPERDAL) 0.5 MG tablet Take 0.5 mg by mouth at bedtime.   Yes Historical Provider, MD  rivastigmine (EXELON) 9.5 mg/24hr Place 9.5 mg onto the skin daily.   Yes Historical Provider, MD  OVER THE COUNTER MEDICATION Take 1 Container by mouth daily. Med Pass 2.0    Historical Provider, MD   BP 115/66 mmHg  Pulse 82  Temp(Src) 98.1 F (36.7 C) (Oral)  Resp 18  SpO2 96% Physical Exam  Constitutional: She appears well-developed and well-nourished.  HENT:  Head: Normocephalic and atraumatic.  Right Ear: External ear normal.  Left Ear: External ear normal.  Nose: Nose normal.  Mouth/Throat: Oropharynx is clear and moist.  Eyes: EOM are normal. Pupils are equal, round, and reactive to light.  Neck: Neck supple.  No C-spine tenderness to palpation  Cardiovascular: Normal rate, regular rhythm, normal heart sounds and intact distal pulses.   Pulmonary/Chest: Effort normal and breath sounds normal. She exhibits no tenderness.  Abdominal: Soft. Bowel sounds are normal. She exhibits no distension. There is no tenderness.  Musculoskeletal: Normal range of motion. She exhibits no edema.  Hips nontender to compression. Patient can perform range of motion  with flexion and extension at the hips and the knees without any endorsement of pain. No deformities of extremities. Of also put upper extremities through range of motion with no pain or injection from the patient.  Neurological: She is alert. She has normal strength. No cranial nerve deficit. She exhibits normal muscle tone. Coordination normal. GCS eye subscore is 4. GCS verbal subscore is 5. GCS motor subscore is 6.  Skin: Skin is warm, dry and intact.  Psychiatric: She has a normal mood and affect.    ED Course  Procedures (including critical care time) Labs Review Labs Reviewed - No data to display  Imaging Review No results found. I have personally reviewed and evaluated these images and lab results as part of my medical decision-making.   EKG Interpretation None      MDM   Final diagnoses:  Fall, initial encounter  Dementia, with behavioral disturbance   At this time, no injury has been identified. There was suggestion the patient had a seizure. She reportedly has a history of seizures as well as dementia. Upon examination, the patient is alert and interactive. No diagnostic studies appear indicated at this time. Instructions are provided for continued observation at the nursing facility for signs of occult injury or other changes from baseline function.    Arby BarretteMarcy Kastin Cerda, MD 04/05/16 [phone]

## 2016-04-05 NOTE — ED Notes (Signed)
Pt ambulated to the restroom and back to her room. NAD noted.

## 2016-07-21 ENCOUNTER — Emergency Department (HOSPITAL_COMMUNITY)
Admission: EM | Admit: 2016-07-21 | Discharge: 2016-07-21 | Disposition: A | Discharge status: Home / Self Care | Payer: Medicare Other | Attending: Emergency Medicine | Admitting: Emergency Medicine

## 2016-07-21 ENCOUNTER — Encounter (HOSPITAL_COMMUNITY): Payer: Self-pay

## 2016-07-21 DIAGNOSIS — Z7982 Long term (current) use of aspirin: Secondary | ICD-10-CM | POA: Insufficient documentation

## 2016-07-21 DIAGNOSIS — I10 Essential (primary) hypertension: Secondary | ICD-10-CM | POA: Diagnosis not present

## 2016-07-21 DIAGNOSIS — R569 Unspecified convulsions: Secondary | ICD-10-CM

## 2016-07-21 LAB — CBG MONITORING, ED: Glucose-Capillary: 81 mg/dL (ref 65–99)

## 2016-07-21 NOTE — ED Provider Notes (Signed)
[facility]-EMERGENCY DEPT Provider Note   CSN: [id number] Arrival date & time: 07/21/16  2029     History   Chief Complaint Chief Complaint  Patient presents with  . Seizures   Level 5 caveat due to dementia.  HPI Isabella [name] is a 82 y.o. female.  HPI Patient brought in after reported tonic-clonic seizure. History of same. On Keppra. Reportedly was at nursing home slumped down and had tonic-clonic seizure for 2 minutes. One episode of vomiting. By the time EMS arrived she is reportedly at her baseline dementia. Report was that patient's family normally does not want her transported for this, but nursing home stated that they needed to transport her. She is only complaining of being hungry at this time. Past Medical History:  Diagnosis Date  . Dementia   . Hearing loss   . Hypertension   . Seizures (HCC)   . Vascular dementia   . Weight loss     Patient Active Problem List   Diagnosis Date Noted  . Dementia 04/21/2015    History reviewed. No pertinent surgical history.  OB History    No data available       Home Medications    Prior to Admission medications   Medication Sig Start Date End Date Taking? Authorizing Provider  acetaminophen (TYLENOL) 650 MG CR tablet Take 650 mg by mouth 3 (three) times daily.    Yes Historical Provider, MD  aspirin 81 MG chewable tablet Chew 81 mg by mouth daily.   Yes Historical Provider, MD  chlorthalidone (HYGROTON) 25 MG tablet Take 12.5 mg by mouth every morning.   Yes Historical Provider, MD  Cholecalciferol (VITAMIN D) 2000 units tablet Take 2,000 Units by mouth daily.   Yes Historical Provider, MD  docusate sodium (COLACE) 100 MG capsule Take 100 mg by mouth daily.    Yes Historical Provider, MD  levETIRAcetam (KEPPRA) 250 MG tablet Take 250 mg by mouth every evening.    Yes Historical Provider, MD  levETIRAcetam (KEPPRA) 500 MG tablet Take 500 mg by mouth every morning.   Yes Historical Provider, MD  LORazepam (ATIVAN) 0.5 MG  tablet Take 0.5 mg by mouth 2 (two) times daily. Also takes 0.5 mg as needed for agitation. Last as needed dose was 03/05/16   Yes Historical Provider, MD  Melatonin 3 MG CAPS Take 3 mg by mouth at bedtime.   Yes Historical Provider, MD  mirtazapine (REMERON) 15 MG tablet Take 7.5 mg by mouth at bedtime.   Yes Historical Provider, MD  Multiple Vitamin (MULTIVITAMIN) tablet Take 1 tablet by mouth daily.   Yes Historical Provider, MD  OVER THE COUNTER MEDICATION Take 1 Container by mouth daily. Med Pass 2.0   Yes Historical Provider, MD  Polyethyl Glycol-Propyl Glycol (SYSTANE) 0.4-0.3 % SOLN Place 1 drop into both eyes 2 (two) times daily.   Yes Historical Provider, MD  potassium chloride SA (K-DUR,KLOR-CON) 20 MEQ tablet Take 20 mEq by mouth daily.   Yes Historical Provider, MD  risperiDONE (RISPERDAL) 0.5 MG tablet Take 0.5 mg by mouth at bedtime.   Yes Historical Provider, MD  rivastigmine (EXELON) 9.5 mg/24hr Place 9.5 mg onto the skin daily.   Yes Historical Provider, MD  naproxen (NAPROSYN) 375 MG tablet Take 375 mg by mouth 2 (two) times daily as needed for mild pain or moderate pain.    Historical Provider, MD    Family History Family History  Problem Relation Age of Onset  . Family history unknown: Yes  Social History Social History  Substance Use Topics  . Smoking status: Never Smoker  . Smokeless tobacco: Never Used  . Alcohol use No     Allergies   Review of patient's allergies indicates no known allergies.   Review of Systems Review of Systems  Unable to perform ROS: Dementia     Physical Exam Updated Vital Signs BP 141/89 (BP Location: Right Arm)   Pulse 73   Temp 97.4 F (36.3 C) (Oral)   Resp 18   Ht 5\' 3"  (1.6 m)   Wt 98 lb (44.5 kg)   SpO2 94%   BMI 17.36 kg/m   Physical Exam  Constitutional: She appears well-developed.  HENT:  Head: Atraumatic.  Neck: Neck supple.  Cardiovascular: Normal rate.   Pulmonary/Chest: Effort normal.  Abdominal:  Soft. There is no tenderness.  Musculoskeletal: She exhibits no edema.  Neurological: She is alert.  Baseline dementia  Skin: Skin is warm.     ED Treatments / Results  Labs (all labs ordered are listed, but only abnormal results are displayed) Labs Reviewed  CBG MONITORING, ED    EKG  EKG Interpretation None       Radiology No results found.  Procedures Procedures (including critical care time)  Medications Ordered in ED Medications - No data to display   Initial Impression / Assessment and Plan / ED Course  I have reviewed the triage vital signs and the nursing notes.  Pertinent labs & imaging results that were available during my care of the patient were reviewed by me and considered in my medical decision making (see chart for details).  Clinical Course  Patient with reported seizure. At baseline now. No evidence of trauma. She is already on antiepileptic. Discharge back to nursing home.  Final Clinical Impressions(s) / ED Diagnoses   Final diagnoses:  Seizure [facility](HCC)    New Prescriptions New Prescriptions   No medications on file     Benjiman CoreNathan Ahrianna Siglin, MD 07/21/16 2326

## 2016-07-21 NOTE — ED Notes (Signed)
No respiratory or acute distress noted alert and talking but confused no seizure activity noted seizure precautions in effect.

## 2016-07-21 NOTE — Discharge Instructions (Signed)
Follow with your doctor or neurologist to see if they would like to change the dose of the medication for the seizures.

## 2016-07-21 NOTE — ED Triage Notes (Signed)
[facility] has seizure at facility states staff tonic clonic in nature lasted about 2 minutes and vomited once.

## 2016-09-27 ENCOUNTER — Encounter (HOSPITAL_COMMUNITY): Payer: Self-pay | Admitting: *Deleted

## 2016-09-27 ENCOUNTER — Observation Stay (HOSPITAL_COMMUNITY)
Admission: EM | Admit: 2016-09-27 | Discharge: 2016-09-28 | Disposition: A | Discharge status: Skilled Nursing Facility | Payer: Medicare Other | Attending: Oncology | Admitting: Oncology

## 2016-09-27 ENCOUNTER — Emergency Department (HOSPITAL_COMMUNITY): Payer: Medicare Other

## 2016-09-27 DIAGNOSIS — E876 Hypokalemia: Secondary | ICD-10-CM | POA: Insufficient documentation

## 2016-09-27 DIAGNOSIS — R4182 Altered mental status, unspecified: Secondary | ICD-10-CM | POA: Diagnosis present

## 2016-09-27 DIAGNOSIS — I10 Essential (primary) hypertension: Secondary | ICD-10-CM | POA: Insufficient documentation

## 2016-09-27 DIAGNOSIS — R451 Restlessness and agitation: Secondary | ICD-10-CM | POA: Insufficient documentation

## 2016-09-27 DIAGNOSIS — G4700 Insomnia, unspecified: Secondary | ICD-10-CM | POA: Insufficient documentation

## 2016-09-27 DIAGNOSIS — R569 Unspecified convulsions: Secondary | ICD-10-CM | POA: Insufficient documentation

## 2016-09-27 DIAGNOSIS — F0390 Unspecified dementia without behavioral disturbance: Secondary | ICD-10-CM | POA: Diagnosis not present

## 2016-09-27 DIAGNOSIS — Z7982 Long term (current) use of aspirin: Secondary | ICD-10-CM | POA: Insufficient documentation

## 2016-09-27 DIAGNOSIS — J189 Pneumonia, unspecified organism: Secondary | ICD-10-CM

## 2016-09-27 LAB — URINALYSIS, ROUTINE W REFLEX MICROSCOPIC
Bilirubin Urine: NEGATIVE
GLUCOSE, UA: NEGATIVE mg/dL
HGB URINE DIPSTICK: NEGATIVE
Ketones, ur: NEGATIVE mg/dL
Leukocytes, UA: NEGATIVE
Nitrite: NEGATIVE
Protein, ur: NEGATIVE mg/dL
SPECIFIC GRAVITY, URINE: 1.008 (ref 1.005–1.030)
pH: 7 (ref 5.0–8.0)

## 2016-09-27 LAB — CBC WITH DIFFERENTIAL/PLATELET
Basophils Absolute: 0 10*3/uL (ref 0.0–0.1)
Basophils Relative: 0 %
EOS ABS: 0.2 10*3/uL (ref 0.0–0.7)
Eosinophils Relative: 3 %
HEMATOCRIT: 37.9 % (ref 36.0–46.0)
HEMOGLOBIN: 12.6 g/dL (ref 12.0–15.0)
LYMPHS ABS: 2 10*3/uL (ref 0.7–4.0)
Lymphocytes Relative: 34 %
MCH: 31.2 pg (ref 26.0–34.0)
MCHC: 33.2 g/dL (ref 30.0–36.0)
MCV: 93.8 fL (ref 78.0–100.0)
MONOS PCT: 9 %
Monocytes Absolute: 0.5 10*3/uL (ref 0.1–1.0)
NEUTROS ABS: 3.2 10*3/uL (ref 1.7–7.7)
NEUTROS PCT: 54 %
Platelets: 282 10*3/uL (ref 150–400)
RBC: 4.04 MIL/uL (ref 3.87–5.11)
RDW: 12.9 % (ref 11.5–15.5)
WBC: 5.9 10*3/uL (ref 4.0–10.5)

## 2016-09-27 LAB — COMPREHENSIVE METABOLIC PANEL
ALBUMIN: 3.3 g/dL — AB (ref 3.5–5.0)
ALT: 15 U/L (ref 14–54)
AST: 20 U/L (ref 15–41)
Alkaline Phosphatase: 55 U/L (ref 38–126)
Anion gap: 9 (ref 5–15)
BUN: 13 mg/dL (ref 6–20)
CHLORIDE: 103 mmol/L (ref 101–111)
CO2: 28 mmol/L (ref 22–32)
CREATININE: 0.78 mg/dL (ref 0.44–1.00)
Calcium: 9.5 mg/dL (ref 8.9–10.3)
GFR calc Af Amer: >60 mL/min (ref >60)
GFR calc non Af Amer: >60 mL/min (ref >60)
GLUCOSE: 77 mg/dL (ref 65–99)
Potassium: 3.7 mmol/L (ref 3.5–5.1)
SODIUM: 140 mmol/L (ref 135–145)
Total Bilirubin: 0.7 mg/dL (ref 0.3–1.2)
Total Protein: 6.3 g/dL — ABNORMAL LOW (ref 6.5–8.1)

## 2016-09-27 LAB — I-STAT CG4 LACTIC ACID, ED
LACTIC ACID, VENOUS: 1.91 mmol/L — AB (ref 0.5–1.9)
Lactic Acid, Venous: 1.58 mmol/L (ref 0.5–1.9)

## 2016-09-27 LAB — PROCALCITONIN

## 2016-09-27 LAB — CBG MONITORING, ED: Glucose-Capillary: 72 mg/dL (ref 65–99)

## 2016-09-27 MED ORDER — RISPERIDONE 0.5 MG PO TABS
0.5000 mg | ORAL_TABLET | Freq: Every day | ORAL | Status: DC
  Administered 2016-09-27: 0.5 mg via ORAL
  Filled 2016-09-27: qty 1

## 2016-09-27 MED ORDER — SODIUM CHLORIDE 0.9 % IV SOLN
250.0000 mg | Freq: Once | INTRAVENOUS | Status: AC
  Administered 2016-09-27: 250 mg via INTRAVENOUS
  Filled 2016-09-27: qty 2.5

## 2016-09-27 MED ORDER — DEXTROSE 5 % IV SOLN: 1.0000 g | INTRAVENOUS | Status: DC

## 2016-09-27 MED ORDER — POLYETHYLENE GLYCOL 3350 17 G PO PACK: 17.0000 g | PACK | Freq: Every day | ORAL | Status: DC | PRN

## 2016-09-27 MED ORDER — VITAMIN D3 25 MCG (1000 UNIT) PO TABS
2000.0000 [IU] | ORAL_TABLET | Freq: Every day | ORAL | Status: DC
  Administered 2016-09-28: 2000 [IU] via ORAL
  Filled 2016-09-27 (×2): qty 2

## 2016-09-27 MED ORDER — DEXTROSE 5 % IV SOLN
2.0000 g | Freq: Once | INTRAVENOUS | Status: AC
  Administered 2016-09-27: 2 g via INTRAVENOUS
  Filled 2016-09-27: qty 2

## 2016-09-27 MED ORDER — LEVETIRACETAM 250 MG PO TABS: 250.0000 mg | ORAL_TABLET | Freq: Every evening | ORAL | Status: DC

## 2016-09-27 MED ORDER — ACETAMINOPHEN 325 MG PO TABS: 650.0000 mg | ORAL_TABLET | Freq: Four times a day (QID) | ORAL | Status: DC | PRN

## 2016-09-27 MED ORDER — ASPIRIN 81 MG PO CHEW
81.0000 mg | CHEWABLE_TABLET | Freq: Every day | ORAL | Status: DC
  Administered 2016-09-28: 81 mg via ORAL
  Filled 2016-09-27: qty 1

## 2016-09-27 MED ORDER — POLYVINYL ALCOHOL 1.4 % OP SOLN
1.0000 [drp] | Freq: Two times a day (BID) | OPHTHALMIC | Status: DC
  Administered 2016-09-28: 1 [drp] via OPHTHALMIC
  Filled 2016-09-27: qty 15

## 2016-09-27 MED ORDER — LORAZEPAM 0.5 MG PO TABS: 0.5000 mg | ORAL_TABLET | Freq: Two times a day (BID) | ORAL | Status: DC

## 2016-09-27 MED ORDER — CHLORTHALIDONE 25 MG PO TABS
12.5000 mg | ORAL_TABLET | ORAL | Status: DC
Start: 2016-09-28 — End: 2016-09-28
  Administered 2016-09-28: 12.5 mg via ORAL
  Filled 2016-09-27: qty 1

## 2016-09-27 MED ORDER — VANCOMYCIN HCL 500 MG IV SOLR: 500.0000 mg | INTRAVENOUS | Status: DC

## 2016-09-27 MED ORDER — ACETAMINOPHEN 650 MG RE SUPP: 650.0000 mg | Freq: Four times a day (QID) | RECTAL | Status: DC | PRN

## 2016-09-27 MED ORDER — MIRTAZAPINE 15 MG PO TABS
7.5000 mg | ORAL_TABLET | Freq: Every day | ORAL | Status: DC
  Administered 2016-09-27: 7.5 mg via ORAL
  Filled 2016-09-27: qty 1

## 2016-09-27 MED ORDER — LORAZEPAM 0.5 MG PO TABS
0.5000 mg | ORAL_TABLET | Freq: Two times a day (BID) | ORAL | Status: DC
  Administered 2016-09-28: 0.5 mg via ORAL
  Filled 2016-09-27: qty 1

## 2016-09-27 MED ORDER — VANCOMYCIN HCL IN DEXTROSE 1-5 GM/200ML-% IV SOLN
1000.0000 mg | Freq: Once | INTRAVENOUS | Status: AC
  Administered 2016-09-27: 1000 mg via INTRAVENOUS
  Filled 2016-09-27: qty 200

## 2016-09-27 MED ORDER — ENOXAPARIN SODIUM 40 MG/0.4ML ~~LOC~~ SOLN
40.0000 mg | SUBCUTANEOUS | Status: DC
  Administered 2016-09-27: 40 mg via SUBCUTANEOUS
  Filled 2016-09-27: qty 0.4

## 2016-09-27 MED ORDER — LEVETIRACETAM 500 MG PO TABS
500.0000 mg | ORAL_TABLET | ORAL | Status: DC
  Administered 2016-09-28: 500 mg via ORAL
  Filled 2016-09-27: qty 1

## 2016-09-27 MED ORDER — LORAZEPAM 2 MG/ML IJ SOLN
0.5000 mg | Freq: Once | INTRAMUSCULAR | Status: AC
  Administered 2016-09-27: 0.5 mg via INTRAVENOUS
  Filled 2016-09-27: qty 1

## 2016-09-27 NOTE — ED Notes (Signed)
[facility] called & reported that they were contacting family for the pt re: transport to the hospital

## 2016-09-27 NOTE — H&P (Signed)
Date: 09/27/2016               Patient Name:  Isabella [name] MRN: [id number]  DOB: 12/01/1920 Age / Sex: 82 y.o., female   PCP: Mortimer Friesavid Curl, PA         Medical Service: Internal Medicine Teaching Service         Attending Physician: Dr. Cyndie ChimeGranfortuna    First Contact: Dr. Peggyann Juba'Sullivan Pager: [phone]  Second Contact: Dr. Earlene PlaterWallace Pager: [phone]       After Hours (After 5p/  First Contact Pager: [phone]  weekends / holidays): Second Contact Pager: [phone]   Chief Complaint: altered mental status  History of Present Illness: Ms Isabella Busmanllen is a 82 year old woman with dementia, HTN, and seizure disorder who was brought to the ED from her memory care facility after being found unresponsive in bed this morning.  [facility]  Collateral provided staff at facility over telephone. At baseline, she can use the bathroom by herself (though sometimes she will go on the floor rather than toilet), and she can dress herself.  She is hard of hearing which makes communication difficult in addition to her dementia.  However, she is normally calm and cooperative.  She has seizures about once per month with generalized convulsions and loss of bladder continence and vomting.  Today, she did not vomit and no convulsions were witnessed.  She is full code at facility.  Daughter Isabella [name] is healthcare decision maker, and she was unable to be reached be phone this evening.  In the ED, she received a dose of vancomycin and cefepime.  Meds:  Current Meds  Medication Sig  . acetaminophen (TYLENOL) 650 MG CR tablet Take 650 mg by mouth 3 (three) times daily.   Marland Kitchen. aspirin 81 MG chewable tablet Chew 81 mg by mouth daily.  . chlorthalidone (HYGROTON) 25 MG tablet Take 12.5 mg by mouth every morning.  . Cholecalciferol (VITAMIN D) 2000 units tablet Take 2,000 Units by mouth daily.  Marland Kitchen. docusate sodium (COLACE) 100 MG capsule Take 100 mg by mouth daily.   Marland Kitchen. levETIRAcetam (KEPPRA) 250 MG  tablet Take 250 mg by mouth every evening.   . levETIRAcetam (KEPPRA) 500 MG tablet Take 500 mg by mouth every morning.  Marland Kitchen. LORazepam (ATIVAN) 0.5 MG tablet Take 0.5 mg by mouth 2 (two) times daily. Also takes 0.5 mg as needed for agitation.  . Melatonin 3 MG CAPS Take 3 mg by mouth at bedtime.  . mirtazapine (REMERON) 15 MG tablet Take 7.5 mg by mouth at bedtime.  . Multiple Vitamin (MULTIVITAMIN) tablet Take 1 tablet by mouth daily.  . naproxen (NAPROSYN) 375 MG tablet Take 375 mg by mouth 2 (two) times daily as needed for mild pain or moderate pain.  Marland Kitchen. OVER THE COUNTER MEDICATION Take 1 Container by mouth daily. Med Pass 2.0  . Polyethyl Glycol-Propyl Glycol (SYSTANE) 0.4-0.3 % SOLN Place 1 drop into both eyes 2 (two) times daily.  . potassium chloride SA (K-DUR,KLOR-CON) 20 MEQ tablet Take 20 mEq by mouth daily.  . risperiDONE (RISPERDAL) 0.5 MG tablet Take 0.5 mg by mouth at bedtime.  . rivastigmine (EXELON) 9.5 mg/24hr Place 9.5 mg onto the skin daily.     Allergies: Allergies as of 09/27/2016  . (No Known Allergies)   Past Medical History:  Diagnosis Date  . Dementia   . Hearing loss   . Hypertension   . Seizures (HCC)   . Vascular  dementia   . Weight loss     Family History: Unable to obtain due to altered mental status.  Social History: Unable to obtain due to altered mental status.  Review of Systems: Unable to obtain due to altered mental status.  Physical Exam: Blood pressure 133/90, pulse 73, temperature 98.2 F (36.8 C), temperature source Oral, resp. rate 15, height 5\' 5"  (1.651 m), weight 100 lb (45.4 kg), SpO2 98 %.  Physical exam limited by altered mental status and lack of patient cooperation.  Physical Exam  Constitutional:  Disheveled, partially undressed woman picking and her clothes and blankets and refusing to be examined, saying she wants to be left alone  Eyes:  EOM appear grossly intact with spontaneous eye movements  Neck:  Full ROM with  spontaneous movements of head  Musculoskeletal:  No gross deformity of arms or legs  Neurological:  Alert, oriented only to self Moderately agitated Moving all extremities spontaneous No facial droop No dysarthria   CBC Latest Ref Rng & Units 09/27/2016 03/15/2016 11/22/2015  WBC 4.0 - 10.5 K/uL 5.9 - 4.0  Hemoglobin 12.0 - 15.0 g/dL 16.112.6 09.612.2 11.1(L)  Hematocrit 36.0 - 46.0 % 37.9 36.0 34.7(L)  Platelets 150 - 400 K/uL 282 - 260   CMP Latest Ref Rng & Units 09/27/2016 03/15/2016 11/22/2015  Glucose 65 - 99 mg/dL 77 94 83  BUN 6 - 20 mg/dL 13 16 15   Creatinine 0.44 - 1.00 mg/dL 0.450.78 4.090.70 8.110.73  Sodium 135 - 145 mmol/L 140 141 139  Potassium 3.5 - 5.1 mmol/L 3.7 3.3(L) 3.5  Chloride 101 - 111 mmol/L 103 103 101  CO2 22 - 32 mmol/L 28 - 30  Calcium 8.9 - 10.3 mg/dL 9.5 - 9.2  Total Protein 6.5 - 8.1 g/dL 6.3(L) - -  Total Bilirubin 0.3 - 1.2 mg/dL 0.7 - -  Alkaline Phos 38 - 126 U/L 55 - -  AST 15 - 41 U/L 20 - -  ALT 14 - 54 U/L 15 - -   Lactic Acid, Venous    Component Value Date/Time   LATICACIDVEN 1.58 09/27/2016 1658    Urinalysis    Component Value Date/Time   COLORURINE STRAW (A) 09/27/2016 1204   APPEARANCEUR CLEAR 09/27/2016 1204   LABSPEC 1.008 09/27/2016 1204   PHURINE 7.0 09/27/2016 1204   GLUCOSEU NEGATIVE 09/27/2016 1204   HGBUR NEGATIVE 09/27/2016 1204   BILIRUBINUR NEGATIVE 09/27/2016 1204   KETONESUR NEGATIVE 09/27/2016 1204   PROTEINUR NEGATIVE 09/27/2016 1204   UROBILINOGEN 0.2 04/30/2015 2118   NITRITE NEGATIVE 09/27/2016 1204   LEUKOCYTESUR NEGATIVE 09/27/2016 1204   CT Head without Contrast 09/27/2016 IMPRESSION: 1. No acute intracranial abnormalities. 2. Age related volume loss. Moderate chronic microvascular ischemic change.  Chest Radiographs AP and Lateral 09/27/2016 IMPRESSION: Left lower lobe airspace opacity. Appearance concerning for pneumonia. Probable small bilateral effusions  Assessment & Plan by Problem: Active Problems:    Dementia   Altered mental status   HTN (hypertension)   Seizures (HCC)  82 year old woman with dementia and seizure presenting with altered mental status of uncertain etiology.  She was found obtunded, and is now more agitated than her reported baseline.  Possible etiologies include seizure, stroke, and infection.  She is afebrile, without leukocytosis, and no hypoxia or respiratory distress, but has a LLL opacity on CXR.  #Altered Mental Status #Dementia -F/u Bcx -Hold Abx for now -Procalcitonin -Safety sitter -Seizure precautions  #Agitation -Continue home risperidone and lorazepam  #History of Seizures -Continue  home keppra 500 mg qAM and 250 mg qPM  #HTN -Continue home chlorthalidone  #Insomnia -Continue home mirtazapine  Dispo: Admit patient to Observation with expected length of stay less than 2 midnights.  Signed: Alm Bustard, MD 09/27/2016, 6:03 PM  Pager: [phone]

## 2016-09-27 NOTE — ED Provider Notes (Signed)
Emergency Department Provider Note   I have reviewed the triage vital signs and the nursing notes.  Level 5 caveat: patient with baseline dementia and AMS which limits HPI and ROS  HISTORY  Chief Complaint Altered Mental Status   HPI Rudolpho SevinSarah Brisby is a 82 y.o. female with PMH of dementia, HTN, and seizures presents to the emergency room in by EMS from a nursing facility for evaluation of AMS. The patient was last known well yesterday evening. Nursing staff stated that they checked on the patient this morning and she seemed drowsy. When he checked on her again 2 hours later she continued to seem very drowsy and so they called EMS. No report by EMS of recent fever or medication change. At baseline the patient is ambulatory and talkative but confused.   EMS state that when they got on scene the patient was responsive only to pain. She seemed to improve slightly in route. There is blood sugar was 75 in route.    Past Medical History:  Diagnosis Date  . Dementia   . Hearing loss   . Hypertension   . Seizures (HCC)   . Vascular dementia   . Weight loss     Patient Active Problem List   Diagnosis Date Noted  . Altered mental status 09/27/2016  . HTN (hypertension) 09/27/2016  . Seizures (HCC) 09/27/2016  . Dementia 04/21/2015    History reviewed. No pertinent surgical history.  Current Outpatient Rx  . Order #: [id number] Class: Historical Med  . Order #: [id number] Class: Historical Med  . Order #: [id number] Class: Historical Med  . Order #: [id number] Class: Historical Med  . Order #: [id number] Class: Historical Med  . Order #: [id number] Class: Historical Med  . Order #: [id number] Class: Historical Med  . Order #: [id number] Class: Historical Med  . Order #: [id number] Class: Historical Med  . Order #: [id number] Class: Historical Med  . Order #: [id number] Class: Historical Med  . Order #: [id number] Class: Historical Med  . Order #: [id number] Class: Historical Med  . Order #:  [id number] Class: Historical Med  . Order #: [id number] Class: Historical Med  . Order #: [id number] Class: Historical Med  . Order #: [id number] Class: Historical Med    Allergies Patient has no known allergies.  Family History  Problem Relation Age of Onset  . Family history unknown: Yes    Social History Social History  Substance Use Topics  . Smoking status: Never Smoker  . Smokeless tobacco: Never Used  . Alcohol use No    Review of Systems  Level 5 caveat: patient with baseline dementia and AMS which limits HPI and ROS  ____________________________________________   PHYSICAL EXAM:  VITAL SIGNS: ED Triage Vitals  Enc Vitals Group     BP 09/27/16 1117 144/94     Pulse Rate 09/27/16 1117 62     Resp 09/27/16 1117 22     Temp 09/27/16 1118 98.2 F (36.8 C)     Temp Source 09/27/16 1118 Oral     SpO2 09/27/16 1117 96 %     Weight 09/27/16 1115 100 lb (45.4 kg)     Height 09/27/16 1115 5\' 5"  (1.651 m)   Constitutional: Somnolent but arouses to loud voice. Follows commands weakly.  Eyes: Conjunctivae are normal. Slight right pupil abnormality but not abnormally dilated.  Head: Atraumatic. Nose: No congestion/rhinnorhea. Mouth/Throat: Mucous membranes are moist.  Oropharynx non-erythematous. Neck: No stridor.   Cardiovascular: Normal rate, regular rhythm. Good peripheral circulation. Grossly normal heart sounds.   Respiratory: Normal respiratory effort.  No  retractions. Lungs CTAB. Gastrointestinal: Soft and nontender. No distention.  Musculoskeletal: No lower extremity tenderness nor edema. No gross deformities of extremities. Neurologic:  Speech is muffled and slurred. Exam limited by AMS but patient can wiggles toes and attempt to lift legs off of bed. Can hold up two fingers bilaterally. Weak grip strength bilaterally. Patient preferentially looking left but will cross midline to look at me during exam.  Skin:  Skin is warm, dry and intact. No rash  noted.  ____________________________________________   LABS (all labs ordered are listed, but only abnormal results are displayed)  Labs Reviewed  COMPREHENSIVE METABOLIC PANEL - Abnormal; Notable for the following:       Result Value   Total Protein 6.3 (*)    Albumin 3.3 (*)    All other components within normal limits  URINALYSIS, ROUTINE W REFLEX MICROSCOPIC - Abnormal; Notable for the following:    Color, Urine STRAW (*)    All other components within normal limits  I-STAT CG4 LACTIC ACID, ED - Abnormal; Notable for the following:    Lactic Acid, Venous 1.91 (*)    All other components within normal limits  CULTURE, BLOOD (ROUTINE X 2)  CULTURE, BLOOD (ROUTINE X 2)  CBC WITH DIFFERENTIAL/PLATELET  PROCALCITONIN  CBG MONITORING, ED  I-STAT CG4 LACTIC ACID, ED   ____________________________________________  EKG   EKG Interpretation  Date/Time:  Friday September 27 2016 11:16:38 EST Ventricular Rate:  60 PR Interval:    QRS Duration: 90 QT Interval:  462 QTC Calculation: 462 R Axis:   -29 Text Interpretation:  Sinus rhythm Borderline left axis deviation Abnormal R-wave progression, early transition No STEMI.  Confirmed by LONG MD, JOSHUA [phone]) on 09/27/2016 11:29:39 AM       ____________________________________________  RADIOLOGY  Dg Chest 2 View  Result Date: 09/27/2016 CLINICAL DATA:  Altered mental status EXAM: CHEST  2 VIEW COMPARISON:  None. FINDINGS: Heart is borderline in size. Left lower lobe airspace opacity concerning for pneumonia. Right lung clear. Small effusion suspected on the lateral view. No acute bony abnormality. IMPRESSION: Left lower lobe airspace opacity. Appearance concerning for pneumonia. Probable small bilateral effusions. Electronically Signed   By: Charlett Nose M.D.   On: 09/27/2016 11:52   Ct Head Wo Contrast  Result Date: 09/27/2016 CLINICAL DATA:  Altered mental status since yesterday. Pt has history of dementia. Pt not  verbally responsive to staff, unable to obtain further information EXAM: CT HEAD WITHOUT CONTRAST TECHNIQUE: Contiguous axial images were obtained from the base of the skull through the vertex without intravenous contrast. COMPARISON:  03/15/2016 FINDINGS: Brain: No evidence of acute infarction, hemorrhage, hydrocephalus, extra-axial collection or mass lesion/mass effect. There is age related ventricular and sulcal enlargement. Patchy white matter hypoattenuation is noted consistent moderate chronic microvascular ischemic change. Vascular: No hyperdense vessel or unexpected calcification. Skull: Normal. Negative for fracture or focal lesion. Sinuses/Orbits: No acute finding. Other: Chronic partial opacification of mastoid air cells and the left epitympanum stable from the prior study. IMPRESSION: 1. No acute intracranial abnormalities. 2. Age related volume loss. Moderate chronic microvascular ischemic change. Electronically Signed   By: Amie Portland M.D.   On: 09/27/2016 11:42    ____________________________________________   PROCEDURES  Procedure(s) performed:   Procedures  None ____________________________________________   INITIAL IMPRESSION / ASSESSMENT AND PLAN / ED COURSE  Pertinent labs & imaging results that were available during my care of the patient were reviewed by me and considered in my medical decision making (see chart  for details).  Patient resents to the emergency department for evaluation of somnolence and speech change. Blood sugar in the field was 75. EKG with no acute abdomen now only. Patient seems to have a significant mental status change from baseline with garbled speech. Some concern for possible ischemic versus hemorrhagic stroke but the patient is well outside of the TPA window so could stroke was not initiated. Also considering underlying infectious etiology. We'll plan for urine by catheter, labs, CT head, chest x-ray. Patient seems to be more awake than when EMS  arrived on scene and does not require acute airway management at this time.  PNA on CXR. Patient mental status improving. No focal neurological deficits.   Spoke with IM teaching team. They will be down to evaluate and place orders.  ____________________________________________  FINAL CLINICAL IMPRESSION(S) / ED DIAGNOSES  Final diagnoses:  Altered mental status, unspecified altered mental status type  Community acquired pneumonia, unspecified laterality     MEDICATIONS GIVEN DURING THIS VISIT:  Medications  ceFEPIme (MAXIPIME) 1 g in dextrose 5 % 50 mL IVPB (not administered)  vancomycin (VANCOCIN) 500 mg in sodium chloride 0.9 % 100 mL IVPB (not administered)  ceFEPIme (MAXIPIME) 2 g in dextrose 5 % 50 mL IVPB (0 g Intravenous Stopped 09/27/16 1415)  vancomycin (VANCOCIN) IVPB 1000 mg/200 mL premix (1,000 mg Intravenous New Bag/Given 09/27/16 1415)     NEW OUTPATIENT MEDICATIONS STARTED DURING THIS VISIT:  None   Note:  This document was prepared using Dragon voice recognition software and may include unintentional dictation errors.  Alona BeneJoshua Long, MD Emergency Medicine   Maia PlanJoshua G Long, MD 09/27/16 [phone]

## 2016-09-27 NOTE — Progress Notes (Signed)
Pharmacy Antibiotic Note  Isabella SevinSarah Muckey is a 82 y.o. female admitted on 09/27/2016 with pneumonia.  Pharmacy has been consulted for cefepime and vancomycin dosing. Labs are pending CrCl based on age and baseline renal function ~25-30. Will dose antibiotic accordingly and adjust as needed.   Plan: Vancomycin 1000mg  IV and cefepime 2000mg  IV x1 in ED followed by Vancomycin 500mg  IV every 24 hours.  Goal trough 15-20 mcg/mL.  Cefepime 1000mg  IV q24 hours  Height: 5\' 5"  (165.1 cm) Weight: 100 lb (45.4 kg) IBW/kg (Calculated) : 57  Temp (24hrs), Avg:98.2 F (36.8 C), Min:98.2 F (36.8 C), Max:98.2 F (36.8 C)  No results for input(s): WBC, CREATININE, LATICACIDVEN, VANCOTROUGH, VANCOPEAK, VANCORANDOM, GENTTROUGH, GENTPEAK, GENTRANDOM, TOBRATROUGH, TOBRAPEAK, TOBRARND, AMIKACINPEAK, AMIKACINTROU, AMIKACIN in the last 168 hours.  CrCl cannot be calculated (Patient's most recent lab result is older than the maximum 21 days allowed.).    No Known Allergies  Thank you for allowing pharmacy to be a part of this patient's care.  Toniann Failony L Sincerity Cedar 09/27/2016 12:21 PM

## 2016-09-27 NOTE — Progress Notes (Signed)
Patient from [facility]. CSW following for disposition and return to facility when medically appropriate.    Enos FlingAshley Verbie Babic, MSW, LCSW [facility] ED/55M Clinical Social Worker [phone]

## 2016-09-27 NOTE — Progress Notes (Signed)
Received from ED via stretcher.  Presently calm as transferred to bed.  Within 15 min became very agitated, combative, saying "get those snakes off my bed".  Refused to cooperate, getting OOB, hitting at staff.  Applied emergency wrist restraints to maintain safety.  No sitters available, unable to contact family.  MD paged & received return call [phone].

## 2016-09-27 NOTE — ED Notes (Signed)
Charge RN aware of need for Recruitment consultantsafety sitter, pt attempting to pull out IV, pt taking off gown & will not keep on cardiac monitoring, IV site wrapped with tube guaze

## 2016-09-27 NOTE — ED Triage Notes (Signed)
Pt in from [facility], per report pt LSN yesterday unknown time, pt baseline ambulatory with hx of dementia, pt alert to pain per EMS today, airway intact, NAD upon arrival to ED

## 2016-09-28 DIAGNOSIS — F0390 Unspecified dementia without behavioral disturbance: Secondary | ICD-10-CM

## 2016-09-28 DIAGNOSIS — Z79899 Other long term (current) drug therapy: Secondary | ICD-10-CM | POA: Diagnosis not present

## 2016-09-28 DIAGNOSIS — I10 Essential (primary) hypertension: Secondary | ICD-10-CM | POA: Diagnosis not present

## 2016-09-28 DIAGNOSIS — R451 Restlessness and agitation: Secondary | ICD-10-CM

## 2016-09-28 DIAGNOSIS — G40909 Epilepsy, unspecified, not intractable, without status epilepticus: Secondary | ICD-10-CM | POA: Diagnosis not present

## 2016-09-28 DIAGNOSIS — G4700 Insomnia, unspecified: Secondary | ICD-10-CM | POA: Diagnosis not present

## 2016-09-28 DIAGNOSIS — R569 Unspecified convulsions: Secondary | ICD-10-CM

## 2016-09-28 LAB — CBC
HCT: 35 % — ABNORMAL LOW (ref 36.0–46.0)
Hemoglobin: 11.5 g/dL — ABNORMAL LOW (ref 12.0–15.0)
MCH: 30.9 pg (ref 26.0–34.0)
MCHC: 32.9 g/dL (ref 30.0–36.0)
MCV: 94.1 fL (ref 78.0–100.0)
PLATELETS: 263 10*3/uL (ref 150–400)
RBC: 3.72 MIL/uL — ABNORMAL LOW (ref 3.87–5.11)
RDW: 13 % (ref 11.5–15.5)
WBC: 5 10*3/uL (ref 4.0–10.5)

## 2016-09-28 LAB — BASIC METABOLIC PANEL
Anion gap: 9 (ref 5–15)
BUN: 12 mg/dL (ref 6–20)
CALCIUM: 9.4 mg/dL (ref 8.9–10.3)
CO2: 27 mmol/L (ref 22–32)
CREATININE: 0.71 mg/dL (ref 0.44–1.00)
Chloride: 104 mmol/L (ref 101–111)
GFR calc Af Amer: >60 mL/min (ref >60)
Glucose, Bld: 81 mg/dL (ref 65–99)
POTASSIUM: 3 mmol/L — AB (ref 3.5–5.1)
SODIUM: 140 mmol/L (ref 135–145)

## 2016-09-28 MED ORDER — LEVETIRACETAM 500 MG PO TABS: 500.0000 mg | ORAL_TABLET | Freq: Two times a day (BID) | ORAL | Status: DC

## 2016-09-28 MED ORDER — POTASSIUM CHLORIDE CRYS ER 20 MEQ PO TBCR
40.0000 meq | EXTENDED_RELEASE_TABLET | Freq: Two times a day (BID) | ORAL | Status: DC
  Administered 2016-09-28: 40 meq via ORAL
  Filled 2016-09-28: qty 2

## 2016-09-28 NOTE — Progress Notes (Signed)
Call to [facility] [phone], spoke to receptionist regarding patient discharge.  Receptionist put this caller on hold, line was then disconnected.  Called again, no answer at this time.

## 2016-09-28 NOTE — Progress Notes (Signed)
Patient daughter to take patient home by private care.  All questions and concerns addressed.  Patient left unit by wheelchair accompanied by staff.

## 2016-09-28 NOTE — Discharge Summary (Signed)
Name: Isabella [name] MRN: [id number] DOB: 01/25/1921 82 y.o. PCP: Mortimer Friesavid Curl, PA  Date of Admission: 09/27/2016 11:10 AM Date of Discharge: 09/28/2016 Attending Physician: Levert FeinsteinJames M Granfortuna, MD  Discharge Diagnosis:  Active Problems:   Dementia   Altered mental status   HTN (hypertension)   Seizures (HCC)   Discharge Medications: Allergies as of 09/28/2016   No Known Allergies     Medication List    TAKE these medications   acetaminophen 650 MG CR tablet Commonly known as:  TYLENOL Take 650 mg by mouth 3 (three) times daily.   aspirin 81 MG chewable tablet Chew 81 mg by mouth daily.   chlorthalidone 25 MG tablet Commonly known as:  HYGROTON Take 12.5 mg by mouth every morning.   docusate sodium 100 MG capsule Commonly known as:  COLACE Take 100 mg by mouth daily.   levETIRAcetam 500 MG tablet Commonly known as:  KEPPRA Take 1 tablet (500 mg total) by mouth 2 (two) times daily. What changed:  when to take this  Another medication with the same name was removed. Continue taking this medication, and follow the directions you see here.   LORazepam 0.5 MG tablet Commonly known as:  ATIVAN Take 0.5 mg by mouth 2 (two) times daily. Also takes 0.5 mg as needed for agitation.   Melatonin 3 MG Caps Take 3 mg by mouth at bedtime.   mirtazapine 15 MG tablet Commonly known as:  REMERON Take 7.5 mg by mouth at bedtime.   multivitamin tablet Take 1 tablet by mouth daily.   naproxen 375 MG tablet Commonly known as:  NAPROSYN Take 375 mg by mouth 2 (two) times daily as needed for mild pain or moderate pain.   OVER THE COUNTER MEDICATION Take 1 Container by mouth daily. Med Pass 2.0   potassium chloride SA 20 MEQ tablet Commonly known as:  K-DUR,KLOR-CON Take 20 mEq by mouth daily.   risperiDONE 0.5 MG tablet Commonly known as:  RISPERDAL Take 0.5 mg by mouth at bedtime.   rivastigmine 9.5 mg/24hr Commonly known as:  EXELON Place 9.5 mg onto the skin  daily.   SYSTANE 0.4-0.3 % Soln Generic drug:  Polyethyl Glycol-Propyl Glycol Place 1 drop into both eyes 2 (two) times daily.   Vitamin D 2000 units tablet Take 2,000 Units by mouth daily.       Disposition and follow-up:   Isabella [name] St[facility].  At the hospital follow up visit please address:  1.  Seizures.  Per staff report at [facility], she has frequent episodes of seizure-like activity with convulsions, and post-ictal state could explain the reduced level of consciousness that lead to this presentation.  Keppra increased to 500 mg BID on discharge.  2.  Hypokalemia. Please check K, supplement as needed.  3.  Labs / imaging needed at time of follow-up: BMP  4.  Pending labs/ test needing follow-up: Keppra level  Follow-up Appointments:   Hospital Course by problem list: Active Problems:   Dementia   Altered mental status   HTN (hypertension)   Seizures (HCC)   1. Altered Mental Status Ms Isabella [name] presented to the ED after reportedly being found lethargic in her bed at the [facility] care [facility].  She has dementia and history of seizures, with the staff member I spoke to on the phone reporting about 1 episode of seizure-like activity monthly, with convulsions, vomiting, and urinary incontinence, none of which were reported  with this episode.  At baseline, she is ambulatory, talkative, and cooperative.  By the time she reached the ED, she was alert but somewhat agitated.  Head CT and UA were negative, but chest radiographs had airspace abnormality concerning for pneumonia.  She was given a dose of vancomycin and cefepime and admitted.  Further antibiotics were held as she was afebrile with reassuring vitals and a low procalcitonin, though her agitation and dementia limited her clinical evaluation.  She did not seem to have any focal neurologic deficits, as she had no facial droop or dysarthria and was  spontaneously moving all extremities.  By the morning after admission she was more calm and cooperative, and was at her reported baseline mental status.  Postictal state is the most likely explanation for her reduced level of consciousness, and reports from [facility] suggests she has been having breakthrough seizures with some regularity.  Her Keppra was increase to 500 mg BID and a drug level sent before discharge.  2. Hypokalemia K of 3.0 on routine labs day after admission, supplemented with oral KCl.    Discharge Vitals:   BP 140/70 (BP Location: Right Arm)   Pulse 69   Temp 98.8 F (37.1 C) (Oral)   Resp 18   Ht 5\' 5"  (1.651 m)   Wt 100 lb (45.4 kg)   SpO2 100%   BMI 16.64 kg/m   Pertinent Labs, Studies, and Procedures:   CT Head without Contrast 09/27/2016 IMPRESSION: 1. No acute intracranial abnormalities. 2. Age related volume loss. Moderate chronic microvascular ischemic Change.  Chest Radiographs AP and Lat 09/27/2016 IMPRESSION: Left lower lobe airspace opacity. Appearance concerning for pneumonia.  Probable small bilateral effusions.  CBC Latest Ref Rng & Units 09/28/2016 09/27/2016 03/15/2016  WBC 4.0 - 10.5 K/uL 5.0 5.9 -  Hemoglobin 12.0 - 15.0 g/dL 11.5(L) 12.6 12.2  Hematocrit 36.0 - 46.0 % 35.0(L) 37.9 36.0  Platelets 150 - 400 K/uL 263 282 -   BMP Latest Ref Rng & Units 09/28/2016 09/27/2016 03/15/2016  Glucose 65 - 99 mg/dL 81 77 94  BUN 6 - 20 mg/dL 12 13 16   Creatinine 0.44 - 1.00 mg/dL 4.090.71 8.110.78 9.140.70  Sodium 135 - 145 mmol/L 140 140 141  Potassium 3.5 - 5.1 mmol/L 3.0(L) 3.7 3.3(L)  Chloride 101 - 111 mmol/L 104 103 103  CO2 22 - 32 mmol/L 27 28 -  Calcium 8.9 - 10.3 mg/dL 9.4 9.5 -   Component     Latest Ref Rng & Units 09/27/2016  Procalcitonin     ng/mL <0.10   Urinalysis    Component Value Date/Time   COLORURINE STRAW (A) 09/27/2016 1204   APPEARANCEUR CLEAR 09/27/2016 1204   LABSPEC 1.008 09/27/2016 1204   PHURINE 7.0 09/27/2016  1204   GLUCOSEU NEGATIVE 09/27/2016 1204   HGBUR NEGATIVE 09/27/2016 1204   BILIRUBINUR NEGATIVE 09/27/2016 1204   KETONESUR NEGATIVE 09/27/2016 1204   PROTEINUR NEGATIVE 09/27/2016 1204   UROBILINOGEN 0.2 04/30/2015 2118   NITRITE NEGATIVE 09/27/2016 1204   LEUKOCYTESUR NEGATIVE 09/27/2016 1204      Discharge Instructions: Discharge Instructions    Diet - low sodium heart healthy    Complete by:  As directed    Increase activity slowly    Complete by:  As directed      Please increase Keppra dose to 500 mg BID.  Signed: Alm BustardMatthew O'Sullivan, MD 09/28/2016, 11:39 AM   Pager: [phone]

## 2016-09-28 NOTE — Discharge Instructions (Addendum)
Please increase Keppra dose to 500 mg BID.

## 2016-09-28 NOTE — Progress Notes (Signed)
MD aware of need for restraints, discussed needed order, IV med x 1 & less restrictive restraint & need to D/C as safety sitter is available.

## 2016-09-28 NOTE — Clinical Social Work Note (Signed)
Per MD patient ready to DC back [facility]. RN, patient/family Meriam Sprague([name]), and facility notified of patient's DC. RN given number for report. Daughter states she will transport the patient to facility by car. CSW signing off at this time.  Roddie McBryant Valetta Mulroy MSW, [place]CS[place], [place], [phone]

## 2016-09-28 NOTE — Progress Notes (Signed)
   Subjective: Placed in soft wrist restraints overnight due to agitation.  This morning, she is calm and cooperative.  Objective:  Vital signs in last 24 hours: Vitals:   09/27/16 1937 09/27/16 2052 09/28/16 0100 09/28/16 1037  BP: 146/78 136/70 140/78 140/70  Pulse: 83 75 78 69  Resp: 16   18  Temp: 98.3 F (36.8 C) 99 F (37.2 C) 98.6 F (37 C) 98.8 F (37.1 C)  TempSrc: Axillary Oral Oral Oral  SpO2: 98% 100% 100% 100%  Weight:      Height:       Physical Exam  Constitutional:  Alert, calm, elderly woman lying in bed with soft wrist cuffs in place  Cardiovascular: Normal rate, regular rhythm and normal heart sounds.   Pulmonary/Chest: Effort normal and breath sounds normal.  Abdominal: Soft. She exhibits no distension. There is no tenderness.  Neurological:  Alert, calm, cooperative Oriented only to self Face symmetric No dysarthria Strength grossly intact throughout all extremities   CBC Latest Ref Rng & Units 09/28/2016 09/27/2016 03/15/2016  WBC 4.0 - 10.5 K/uL 5.0 5.9 -  Hemoglobin 12.0 - 15.0 g/dL 11.5(L) 12.6 12.2  Hematocrit 36.0 - 46.0 % 35.0(L) 37.9 36.0  Platelets 150 - 400 K/uL 263 282 -   BMP Latest Ref Rng & Units 09/28/2016 09/27/2016 03/15/2016  Glucose 65 - 99 mg/dL 81 77 94  BUN 6 - 20 mg/dL 12 13 16   Creatinine 0.44 - 1.00 mg/dL 1.190.71 1.470.78 8.290.70  Sodium 135 - 145 mmol/L 140 140 141  Potassium 3.5 - 5.1 mmol/L 3.0(L) 3.7 3.3(L)  Chloride 101 - 111 mmol/L 104 103 103  CO2 22 - 32 mmol/L 27 28 -  Calcium 8.9 - 10.3 mg/dL 9.4 9.5 -   Component     Latest Ref Rng & Units 09/27/2016  Procalcitonin     ng/mL <0.10    Assessment/Plan:  Active Problems:   Dementia   Altered mental status   HTN (hypertension)   Seizures (HCC)  82 year old woman with dementia and seizure presenting with altered mental status, most likely due to postictal state.  She had a head CT without acute findings and no evidence of infection.  Her mental status is now at  baseline with no focal neurologic deficits.  #Altered Mental Status #Dementia With history of frequent breakthrough seizures, most likely due to postictal state. -Check Keppra level  #Agitation -Continue home risperidone and lorazepam -Safety sitter  #History of Seizures -Increase Keppra to 500 mg BID  Dispo: Anticipated discharge today.  Alm BustardMatthew O'Sullivan, MD 09/28/2016, 12:48 PM Pager: [phone]

## 2016-09-29 ENCOUNTER — Encounter (HOSPITAL_COMMUNITY): Payer: Self-pay | Admitting: Nurse Practitioner

## 2016-09-29 ENCOUNTER — Emergency Department (HOSPITAL_COMMUNITY): Payer: Medicare Other

## 2016-09-29 ENCOUNTER — Inpatient Hospital Stay (HOSPITAL_COMMUNITY)
Admission: EM | Admit: 2016-09-29 | Discharge: 2016-09-30 | DRG: 195 | Disposition: A | Discharge status: Skilled Nursing Facility | Payer: Medicare Other | Attending: Internal Medicine | Admitting: Internal Medicine

## 2016-09-29 DIAGNOSIS — Z515 Encounter for palliative care: Secondary | ICD-10-CM | POA: Diagnosis not present

## 2016-09-29 DIAGNOSIS — F0390 Unspecified dementia without behavioral disturbance: Secondary | ICD-10-CM | POA: Diagnosis present

## 2016-09-29 DIAGNOSIS — E876 Hypokalemia: Secondary | ICD-10-CM | POA: Diagnosis present

## 2016-09-29 DIAGNOSIS — J189 Pneumonia, unspecified organism: Secondary | ICD-10-CM | POA: Diagnosis present

## 2016-09-29 DIAGNOSIS — R0902 Hypoxemia: Secondary | ICD-10-CM | POA: Diagnosis not present

## 2016-09-29 DIAGNOSIS — G40909 Epilepsy, unspecified, not intractable, without status epilepticus: Secondary | ICD-10-CM | POA: Diagnosis not present

## 2016-09-29 DIAGNOSIS — Z7982 Long term (current) use of aspirin: Secondary | ICD-10-CM

## 2016-09-29 DIAGNOSIS — I10 Essential (primary) hypertension: Secondary | ICD-10-CM | POA: Diagnosis present

## 2016-09-29 DIAGNOSIS — R911 Solitary pulmonary nodule: Secondary | ICD-10-CM | POA: Diagnosis present

## 2016-09-29 DIAGNOSIS — Z79899 Other long term (current) drug therapy: Secondary | ICD-10-CM | POA: Diagnosis not present

## 2016-09-29 DIAGNOSIS — F0150 Vascular dementia without behavioral disturbance: Secondary | ICD-10-CM | POA: Diagnosis present

## 2016-09-29 DIAGNOSIS — Z66 Do not resuscitate: Secondary | ICD-10-CM | POA: Diagnosis present

## 2016-09-29 DIAGNOSIS — J181 Lobar pneumonia, unspecified organism: Secondary | ICD-10-CM

## 2016-09-29 LAB — CBC WITH DIFFERENTIAL/PLATELET
BASOS ABS: 0 10*3/uL (ref 0.0–0.1)
BASOS PCT: 0 %
EOS PCT: 4 %
Eosinophils Absolute: 0.2 10*3/uL (ref 0.0–0.7)
HCT: 36.6 % (ref 36.0–46.0)
Hemoglobin: 12.2 g/dL (ref 12.0–15.0)
LYMPHS PCT: 40 %
Lymphs Abs: 1.7 10*3/uL (ref 0.7–4.0)
MCH: 31.7 pg (ref 26.0–34.0)
MCHC: 33.3 g/dL (ref 30.0–36.0)
MCV: 95.1 fL (ref 78.0–100.0)
Monocytes Absolute: 0.4 10*3/uL (ref 0.1–1.0)
Monocytes Relative: 9 %
Neutro Abs: 2 10*3/uL (ref 1.7–7.7)
Neutrophils Relative %: 47 %
PLATELETS: 281 10*3/uL (ref 150–400)
RBC: 3.85 MIL/uL — AB (ref 3.87–5.11)
RDW: 13.3 % (ref 11.5–15.5)
WBC: 4.3 10*3/uL (ref 4.0–10.5)

## 2016-09-29 LAB — I-STAT TROPONIN, ED: Troponin i, poc: 0 ng/mL (ref 0.00–0.08)

## 2016-09-29 LAB — I-STAT CG4 LACTIC ACID, ED: Lactic Acid, Venous: 2.39 mmol/L (ref 0.5–1.9)

## 2016-09-29 LAB — COMPREHENSIVE METABOLIC PANEL
ALBUMIN: 3.7 g/dL (ref 3.5–5.0)
ALT: 14 U/L (ref 14–54)
AST: 22 U/L (ref 15–41)
Alkaline Phosphatase: 60 U/L (ref 38–126)
Anion gap: 9 (ref 5–15)
BUN: 22 mg/dL — AB (ref 6–20)
CHLORIDE: 102 mmol/L (ref 101–111)
CO2: 28 mmol/L (ref 22–32)
CREATININE: 0.62 mg/dL (ref 0.44–1.00)
Calcium: 9 mg/dL (ref 8.9–10.3)
GFR calc Af Amer: >60 mL/min (ref >60)
GFR calc non Af Amer: >60 mL/min (ref >60)
GLUCOSE: 76 mg/dL (ref 65–99)
Potassium: 3.5 mmol/L (ref 3.5–5.1)
SODIUM: 139 mmol/L (ref 135–145)
Total Bilirubin: 0.6 mg/dL (ref 0.3–1.2)
Total Protein: 6.9 g/dL (ref 6.5–8.1)

## 2016-09-29 MED ORDER — IOPAMIDOL (ISOVUE-370) INJECTION 76%
INTRAVENOUS | Status: AC
  Filled 2016-09-29: qty 100

## 2016-09-29 MED ORDER — SODIUM CHLORIDE 0.9 % IV SOLN
500.0000 mg | Freq: Once | INTRAVENOUS | Status: AC
  Administered 2016-09-29: 500 mg via INTRAVENOUS
  Filled 2016-09-29: qty 5

## 2016-09-29 MED ORDER — LEVOFLOXACIN IN D5W 500 MG/100ML IV SOLN
500.0000 mg | Freq: Once | INTRAVENOUS | Status: AC
  Administered 2016-09-29: 500 mg via INTRAVENOUS
  Filled 2016-09-29: qty 100

## 2016-09-29 MED ORDER — IOPAMIDOL (ISOVUE-370) INJECTION 76%
100.0000 mL | Freq: Once | INTRAVENOUS | Status: AC | PRN
  Administered 2016-09-29: 100 mL via INTRAVENOUS

## 2016-09-29 MED ORDER — LORAZEPAM 2 MG/ML IJ SOLN
0.5000 mg | Freq: Once | INTRAMUSCULAR | Status: AC
  Administered 2016-09-29: 0.5 mg via INTRAMUSCULAR
  Filled 2016-09-29: qty 1

## 2016-09-29 MED ORDER — SODIUM CHLORIDE 0.9 % IV BOLUS (SEPSIS)
1000.0000 mL | Freq: Once | INTRAVENOUS | Status: AC
  Administered 2016-09-29: 1000 mL via INTRAVENOUS

## 2016-09-29 NOTE — ED Notes (Signed)
Pt waiting for phlebotomy to draw blood.

## 2016-09-29 NOTE — ED Provider Notes (Signed)
[facility]-EMERGENCY DEPT Provider Note   CSN: [id number] Arrival date & time: 09/29/16  1714     History   Chief Complaint Chief Complaint  Patient presents with  . Medical Evaluation    HPI Isabella [name] is a 82 y.o. female hx of dementia, HTN, seizure, dementia Here presenting with altered mental status. Patient was admitted to the hospital and was discharged yesterday. On admission, patient had a questionable pneumonia on chest x-ray and was given 1 dose of antibiotics. Cultures has been negative and antibiotics was held and she remained afebrile. CT head that was unremarkable and she was thought to have a prolonged postictal period that time so Keppra was increased to 500 mg twice a day. Patient is back to the facility today and apparently was sleeping and then the nurse has trouble arousing her. Patient did wake up in the ambulance. There is no seizure activity that was witnessed.   The history is provided by the patient.    Past Medical History:  Diagnosis Date  . Dementia   . Hearing loss   . Hypertension   . Seizures (HCC)   . Vascular dementia   . Weight loss     Patient Active Problem List   Diagnosis Date Noted  . Post-ictal state (HCC)   . Altered mental status 09/27/2016  . HTN (hypertension) 09/27/2016  . Seizures (HCC) 09/27/2016  . Dementia 04/21/2015    History reviewed. No pertinent surgical history.  OB History    No data available       Home Medications    Prior to Admission medications   Medication Sig Start Date End Date Taking? Authorizing Provider  acetaminophen (TYLENOL) 650 MG CR tablet Take 650 mg by mouth 3 (three) times daily.    Yes Historical Provider, MD  aspirin 81 MG chewable tablet Chew 81 mg by mouth daily.   Yes Historical Provider, MD  chlorthalidone (HYGROTON) 25 MG tablet Take 12.5 mg by mouth every morning.   Yes Historical Provider, MD  Cholecalciferol (VITAMIN D) 2000 units tablet Take 2,000 Units by mouth daily.   Yes  Historical Provider, MD  docusate sodium (COLACE) 100 MG capsule Take 100 mg by mouth daily.    Yes Historical Provider, MD  levETIRAcetam (KEPPRA) 250 MG tablet Take 250 mg by mouth every evening. 06/26/16  Yes Historical Provider, MD  levETIRAcetam (KEPPRA) 500 MG tablet Take 1 tablet (500 mg total) by mouth 2 (two) times daily. Patient taking differently: Take 500 mg by mouth every morning.  09/28/16  Yes Alm BustardMatthew O'Sullivan, MD  LORazepam (ATIVAN) 0.5 MG tablet Take 0.5 mg by mouth 2 (two) times daily. Also takes 0.5 mg as needed for agitation.   Yes Historical Provider, MD  Melatonin 3 MG CAPS Take 3 mg by mouth at bedtime.   Yes Historical Provider, MD  mirtazapine (REMERON) 15 MG tablet Take 7.5 mg by mouth at bedtime.   Yes Historical Provider, MD  Multiple Vitamin (MULTIVITAMIN) tablet Take 1 tablet by mouth daily.   Yes Historical Provider, MD  naproxen (NAPROSYN) 375 MG tablet Take 375 mg by mouth 2 (two) times daily as needed for mild pain or moderate pain.   Yes Historical Provider, MD  Polyethyl Glycol-Propyl Glycol (SYSTANE) 0.4-0.3 % SOLN Place 1 drop into both eyes 2 (two) times daily.   Yes Historical Provider, MD  potassium chloride SA (K-DUR,KLOR-CON) 20 MEQ tablet Take 20 mEq by mouth daily.   Yes Historical Provider, MD  risperiDONE (RISPERDAL) 0.5 MG  tablet Take 0.5 mg by mouth at bedtime.   Yes Historical Provider, MD  rivastigmine (EXELON) 9.5 mg/24hr Place 9.5 mg onto the skin daily.   Yes Historical Provider, MD  OVER THE COUNTER MEDICATION Take 1 Container by mouth daily. Med Pass 2.0    Historical Provider, MD    Family History Family History  Problem Relation Age of Onset  . Family history unknown: Yes    Social History Social History  Substance Use Topics  . Smoking status: Never Smoker  . Smokeless tobacco: Never Used  . Alcohol use No     Allergies   Patient has no known allergies.   Review of Systems Review of Systems  Unable to perform ROS:  Dementia  All other systems reviewed and are negative.    Physical Exam Updated Vital Signs BP 113/65   Pulse (!) 126   Temp 97.4 F (36.3 C) (Oral)   Resp 13   SpO2 (!) 87%   Physical Exam  Constitutional:  Demented, NAD   HENT:  Head: Normocephalic.  Eyes: EOM are normal. Pupils are equal, round, and reactive to light.  Neck: Normal range of motion. Neck supple.  Cardiovascular: Normal rate, regular rhythm and normal heart sounds.   Pulmonary/Chest: Effort normal and breath sounds normal. No respiratory distress. She has no wheezes.  Abdominal: Soft. Bowel sounds are normal. She exhibits no distension. There is no tenderness.  Musculoskeletal: Normal range of motion.  Neurological: She is alert.  Demented, moving all extremities, back to baseline   Skin: Skin is warm.  Psychiatric: She has a normal mood and affect.  Nursing note and vitals reviewed.    ED Treatments / Results  Labs (all labs ordered are listed, but only abnormal results are displayed) Labs Reviewed  CBC WITH DIFFERENTIAL/PLATELET - Abnormal; Notable for the following:       Result Value   RBC 3.85 (*)    All other components within normal limits  COMPREHENSIVE METABOLIC PANEL - Abnormal; Notable for the following:    BUN 22 (*)    All other components within normal limits  I-STAT CG4 LACTIC ACID, ED - Abnormal; Notable for the following:    Lactic Acid, Venous 2.39 (*)    All other components within normal limits  I-STAT TROPOININ, ED    EKG  EKG Interpretation None       Radiology Dg Chest 2 View  Result Date: 09/29/2016 CLINICAL DATA:  Patient with lethargy. EXAM: CHEST  2 VIEW COMPARISON:  Chest radiograph 09/27/2016. FINDINGS: Monitoring leads overlie the patient. Stable cardiac and mediastinal contours. Heterogeneous opacities left lung base. Small left pleural effusion. Thoracic spine degenerative changes. IMPRESSION: Heterogeneous opacities left lung base concerning for pneumonia  in the appropriate clinical setting. Small left pleural effusion. Followup PA and lateral chest X-ray is recommended in 3-4 weeks following trial of antibiotic therapy to ensure resolution and exclude underlying malignancy. Electronically Signed   By: Annia Belt M.D.   On: 09/29/2016 18:25   Ct Angio Chest Pe W And/or Wo Contrast  Result Date: 09/29/2016 CLINICAL DATA:  Abnormal chest x-ray, evaluate for pulmonary embolus versus mass. EXAM: CT ANGIOGRAPHY CHEST WITH CONTRAST TECHNIQUE: Multidetector CT imaging of the chest was performed using the standard protocol during bolus administration of intravenous contrast. Multiplanar CT image reconstructions and MIPs were obtained to evaluate the vascular anatomy. CONTRAST:  100 mL Isovue 370 COMPARISON:  Chest x-ray September 29, 2016 FINDINGS: Cardiovascular: Satisfactory opacification of the pulmonary arteries to  the segmental level. No evidence of pulmonary embolism. Normal heart size. No pericardial effusion. Mediastinum/Nodes: No enlarged mediastinal, hilar, or axillary lymph nodes. Thyroid gland, trachea, and esophagus demonstrate no significant findings. Lungs/Pleura: There are small bilateral pleural effusions. In the right apex, there is a 1.2 x 1.6 cm nodule. There is scarring of bilateral upper lobes. There are small patchy consolidation of the right lower lobe and superior aspect of the left lower lobe, developing pneumonia is not excluded. There is atelectasis of bilateral posterior lung bases. Upper Abdomen: No acute abnormality. Musculoskeletal: Degenerative joint changes of the spine are noted. Review of the MIP images confirms the above findings. IMPRESSION: No pulmonary embolus. There is a 1.2 x 1.6 cm nodule in the right apex. Further evaluation with PET CT on outpatient basis is recommended as neoplasm is not excluded. Small bilateral pleural effusions. Small patchy consolidation of the right lower lobe and superior aspect of the left lower lobe,  developing pneumonia is not excluded. Atelectasis of the posterior lung bases. Electronically Signed   By: Sherian ReinWei-Chen  Lin M.D.   On: 09/29/2016 20:19    Procedures Procedures (including critical care time)  Medications Ordered in ED Medications  iopamidol (ISOVUE-370) 76 % injection (not administered)  levofloxacin (LEVAQUIN) IVPB 500 mg (500 mg Intravenous New Bag/Given 09/29/16 2114)  LORazepam (ATIVAN) injection 0.5 mg (not administered)  levETIRAcetam (KEPPRA) 500 mg in sodium chloride 0.9 % 100 mL IVPB (0 mg Intravenous Stopped 09/29/16 2113)  sodium chloride 0.9 % bolus 1,000 mL (1,000 mLs Intravenous New Bag/Given 09/29/16 2052)  iopamidol (ISOVUE-370) 76 % injection 100 mL (100 mLs Intravenous Contrast Given 09/29/16 1946)     Initial Impression / Assessment and Plan / ED Course  I have reviewed the triage vital signs and the nursing notes.  Pertinent labs & imaging results that were available during my care of the patient were reviewed by me and considered in my medical decision making (see chart for details).  Clinical Course    Isabella SevinSarah Passero is a 82 y.o. female hx of dementia here with AMS. Back to baseline now. She may have another seizure with post ictal period vs worsening dementia. Will get repeat labs, CXR. Will consult neuro.   9:33 PM Patient's lactate 2.4. CT angio showed RLL and LLL pneumonia. She then started dropped O2 to 87% on RA. Placed on oxygen. Given levaquin. Will readmit to [facility] for hypoxia from pneumonia, possible post ictal period. I called Dr. Roseanne RenoStewart from neurology, who recommend keppra 500 mg load and increase to 750 mg BID.    Final Clinical Impressions(s) / ED Diagnoses   Final diagnoses:  None    New Prescriptions New Prescriptions   No medications on file     Charlynne Panderavid Hsienta Yao, MD 09/29/16 2135

## 2016-09-29 NOTE — ED Notes (Signed)
[facility] notified of need for transport. °

## 2016-09-29 NOTE — ED Triage Notes (Signed)
Pt is presented from [facility] for medical evaluation citing pt being lethargic. Reportedly at her dementia baseline.

## 2016-09-30 DIAGNOSIS — J189 Pneumonia, unspecified organism: Principal | ICD-10-CM

## 2016-09-30 DIAGNOSIS — F0150 Vascular dementia without behavioral disturbance: Secondary | ICD-10-CM | POA: Diagnosis not present

## 2016-09-30 DIAGNOSIS — Z66 Do not resuscitate: Secondary | ICD-10-CM | POA: Diagnosis not present

## 2016-09-30 DIAGNOSIS — F0390 Unspecified dementia without behavioral disturbance: Secondary | ICD-10-CM | POA: Diagnosis not present

## 2016-09-30 DIAGNOSIS — Z79899 Other long term (current) drug therapy: Secondary | ICD-10-CM

## 2016-09-30 DIAGNOSIS — Z515 Encounter for palliative care: Secondary | ICD-10-CM | POA: Diagnosis not present

## 2016-09-30 DIAGNOSIS — R0902 Hypoxemia: Secondary | ICD-10-CM | POA: Diagnosis not present

## 2016-09-30 DIAGNOSIS — I10 Essential (primary) hypertension: Secondary | ICD-10-CM | POA: Diagnosis not present

## 2016-09-30 DIAGNOSIS — G40909 Epilepsy, unspecified, not intractable, without status epilepticus: Secondary | ICD-10-CM | POA: Diagnosis not present

## 2016-09-30 DIAGNOSIS — R911 Solitary pulmonary nodule: Secondary | ICD-10-CM | POA: Diagnosis not present

## 2016-09-30 DIAGNOSIS — Z7982 Long term (current) use of aspirin: Secondary | ICD-10-CM | POA: Diagnosis not present

## 2016-09-30 DIAGNOSIS — E876 Hypokalemia: Secondary | ICD-10-CM | POA: Diagnosis not present

## 2016-09-30 LAB — CBC
HCT: 34.1 % — ABNORMAL LOW (ref 36.0–46.0)
HEMOGLOBIN: 11.3 g/dL — AB (ref 12.0–15.0)
MCH: 31.2 pg (ref 26.0–34.0)
MCHC: 33.1 g/dL (ref 30.0–36.0)
MCV: 94.2 fL (ref 78.0–100.0)
Platelets: 247 10*3/uL (ref 150–400)
RBC: 3.62 MIL/uL — AB (ref 3.87–5.11)
RDW: 13 % (ref 11.5–15.5)
WBC: 4.7 10*3/uL (ref 4.0–10.5)

## 2016-09-30 LAB — BASIC METABOLIC PANEL
Anion gap: 6 (ref 5–15)
BUN: 13 mg/dL (ref 6–20)
CHLORIDE: 105 mmol/L (ref 101–111)
CO2: 30 mmol/L (ref 22–32)
Calcium: 9 mg/dL (ref 8.9–10.3)
Creatinine, Ser: 0.7 mg/dL (ref 0.44–1.00)
GFR calc Af Amer: >60 mL/min (ref >60)
GFR calc non Af Amer: >60 mL/min (ref >60)
GLUCOSE: 82 mg/dL (ref 65–99)
POTASSIUM: 3.2 mmol/L — AB (ref 3.5–5.1)
Sodium: 141 mmol/L (ref 135–145)

## 2016-09-30 LAB — PROCALCITONIN

## 2016-09-30 LAB — GLUCOSE, CAPILLARY: Glucose-Capillary: 84 mg/dL (ref 65–99)

## 2016-09-30 LAB — TSH: TSH: 1.352 u[IU]/mL (ref 0.350–4.500)

## 2016-09-30 LAB — CBG MONITORING, ED: GLUCOSE-CAPILLARY: 116 mg/dL — AB (ref 65–99)

## 2016-09-30 MED ORDER — POLYETHYLENE GLYCOL 3350 17 G PO PACK: 17.0000 g | PACK | Freq: Every day | ORAL | Status: DC | PRN

## 2016-09-30 MED ORDER — AZITHROMYCIN 250 MG PO TABS: 250.0000 mg | ORAL_TABLET | Freq: Every day | ORAL | 0 refills | Status: AC

## 2016-09-30 MED ORDER — ACETAMINOPHEN 325 MG PO TABS: 650.0000 mg | ORAL_TABLET | Freq: Four times a day (QID) | ORAL | Status: DC | PRN

## 2016-09-30 MED ORDER — ENOXAPARIN SODIUM 30 MG/0.3ML ~~LOC~~ SOLN: 30.0000 mg | SUBCUTANEOUS | Status: DC

## 2016-09-30 MED ORDER — ASPIRIN 81 MG PO CHEW
81.0000 mg | CHEWABLE_TABLET | Freq: Every day | ORAL | Status: DC
  Administered 2016-09-30: 81 mg via ORAL
  Filled 2016-09-30: qty 1

## 2016-09-30 MED ORDER — CHLORTHALIDONE 25 MG PO TABS
12.5000 mg | ORAL_TABLET | Freq: Every day | ORAL | Status: DC
  Administered 2016-09-30: 12.5 mg via ORAL
  Filled 2016-09-30: qty 1

## 2016-09-30 MED ORDER — POTASSIUM CHLORIDE CRYS ER 20 MEQ PO TBCR: 40.0000 meq | EXTENDED_RELEASE_TABLET | Freq: Once | ORAL | Status: DC

## 2016-09-30 MED ORDER — DEXTROSE 5 % IV SOLN
1.0000 g | INTRAVENOUS | Status: DC
  Administered 2016-09-30: 1 g via INTRAVENOUS
  Filled 2016-09-30: qty 10

## 2016-09-30 MED ORDER — RISPERIDONE 0.5 MG PO TABS: 0.5000 mg | ORAL_TABLET | Freq: Every day | ORAL | Status: DC

## 2016-09-30 MED ORDER — VITAMIN D 1000 UNITS PO TABS
2000.0000 [IU] | ORAL_TABLET | Freq: Every day | ORAL | Status: DC
  Administered 2016-09-30: 2000 [IU] via ORAL
  Filled 2016-09-30: qty 2

## 2016-09-30 MED ORDER — SODIUM CHLORIDE 0.9 % IV SOLN
INTRAVENOUS | Status: AC
  Administered 2016-09-30: 1 mL via INTRAVENOUS

## 2016-09-30 MED ORDER — SODIUM CHLORIDE 0.9% FLUSH
3.0000 mL | Freq: Two times a day (BID) | INTRAVENOUS | Status: DC
  Administered 2016-09-30: 3 mL via INTRAVENOUS

## 2016-09-30 MED ORDER — DEXTROSE 5 % IV SOLN: 500.0000 mg | INTRAVENOUS | Status: DC

## 2016-09-30 MED ORDER — ACETAMINOPHEN 650 MG RE SUPP: 650.0000 mg | Freq: Four times a day (QID) | RECTAL | Status: DC | PRN

## 2016-09-30 MED ORDER — LORAZEPAM 0.5 MG PO TABS: 0.5000 mg | ORAL_TABLET | Freq: Two times a day (BID) | ORAL | Status: DC | PRN

## 2016-09-30 MED ORDER — LEVETIRACETAM 500 MG PO TABS
500.0000 mg | ORAL_TABLET | Freq: Two times a day (BID) | ORAL | Status: DC
  Administered 2016-09-30: 500 mg via ORAL
  Filled 2016-09-30: qty 1

## 2016-09-30 MED ORDER — LORAZEPAM 2 MG/ML IJ SOLN
0.5000 mg | Freq: Once | INTRAMUSCULAR | Status: AC
  Administered 2016-09-30: 0.5 mg via INTRAVENOUS
  Filled 2016-09-30: qty 1

## 2016-09-30 MED ORDER — DEXTROSE 5 % IV SOLN
500.0000 mg | INTRAVENOUS | Status: DC
  Filled 2016-09-30: qty 500

## 2016-09-30 MED ORDER — RIVASTIGMINE 9.5 MG/24HR TD PT24
9.5000 mg | MEDICATED_PATCH | Freq: Every day | TRANSDERMAL | Status: DC
  Administered 2016-09-30: 9.5 mg via TRANSDERMAL
  Filled 2016-09-30: qty 1

## 2016-09-30 NOTE — ED Notes (Signed)
Report given to [facility] RN, pt was transported to [facility] with [facility]

## 2016-09-30 NOTE — Progress Notes (Signed)
[facility] all belonging.  No noted discomfort or complaints.  Vital signs within normal limits.

## 2016-09-30 NOTE — Progress Notes (Signed)
Patient arrived to unit oriented to self (first name only), disoriented to place, situantion and time in no distress. Patient received partial bed bath, gown and full linen change, vitals obtained, resting comfortably. RN will continue to monitor.

## 2016-09-30 NOTE — Discharge Summary (Signed)
Name: Isabella [name] MRN: [id number] DOB: 24-Nov-1920 83 y.o. PCP: Mortimer Fries, PA  Date of Admission: 09/29/2016  5:29 PM Date of Discharge: 09/30/2016 Attending Physician: Earl Lagos, MD  Discharge Diagnosis:  Active Problems:   Dementia   CAP (community acquired pneumonia)   Discharge Medications: Allergies as of 09/30/2016   No Known Allergies     Medication List    TAKE these medications   acetaminophen 650 MG CR tablet Commonly known as:  TYLENOL Take 650 mg by mouth 3 (three) times daily.   aspirin 81 MG chewable tablet Chew 81 mg by mouth daily.   azithromycin 250 MG tablet Commonly known as:  ZITHROMAX Take 1 tablet (250 mg total) by mouth daily. Start taking on:  10/01/2016   chlorthalidone 25 MG tablet Commonly known as:  HYGROTON Take 12.5 mg by mouth every morning.   docusate sodium 100 MG capsule Commonly known as:  COLACE Take 100 mg by mouth daily.   levETIRAcetam 500 MG tablet Commonly known as:  KEPPRA Take 1 tablet (500 mg total) by mouth 2 (two) times daily. What changed:  when to take this  Another medication with the same name was removed. Continue taking this medication, and follow the directions you see here.   LORazepam 0.5 MG tablet Commonly known as:  ATIVAN Take 0.5 mg by mouth 2 (two) times daily. Also takes 0.5 mg as needed for agitation.   Melatonin 3 MG Caps Take 3 mg by mouth at bedtime.   mirtazapine 15 MG tablet Commonly known as:  REMERON Take 7.5 mg by mouth at bedtime.   multivitamin tablet Take 1 tablet by mouth daily.   naproxen 375 MG tablet Commonly known as:  NAPROSYN Take 375 mg by mouth 2 (two) times daily as needed for mild pain or moderate pain.   OVER THE COUNTER MEDICATION Take 1 Container by mouth daily. Med Pass 2.0   potassium chloride SA 20 MEQ tablet Commonly known as:  K-DUR,KLOR-CON Take 20 mEq by mouth daily.   risperiDONE 0.5 MG tablet Commonly known as:  RISPERDAL Take 0.5 mg by  mouth at bedtime.   rivastigmine 9.5 mg/24hr Commonly known as:  EXELON Place 9.5 mg onto the skin daily.   SYSTANE 0.4-0.3 % Soln Generic drug:  Polyethyl Glycol-Propyl Glycol Place 1 drop into both eyes 2 (two) times daily.   Vitamin D 2000 units tablet Take 2,000 Units by mouth daily.       Disposition and follow-up:   Isabella.Isabella [name] was discharged from [facility] in Stable condition.  At the hospital follow up visit please address:  1.  Community Acquired Pneumonia.  Check vitals including pulse oximetry.  2.  Hypokalemia.  Check BMP, replete K as appropriate.  3.  Lethargy.  She has been difficult to arouse overnight, leading to multiple recent admissions.  Review medications, consider discontinuing sedating meds, especially benzodiazepines.  4.  Goals of Care.  Daughter Isabella [name] expressed desire for goals of further medial care to focus on quality of life and for Isabella Isabella [name] to be DNR.  5.  Labs / imaging needed at time of follow-up: BMP  6.  Pending labs/ test needing follow-up: none  Follow-up Appointments: SNF  Hospital Course by problem list: Active Problems:   Dementia   CAP (community acquired pneumonia)   1. Community Acquired Pneumonia Isabella [name] was sent to the ED from her memory care facility at [facility] with concern for lethargy.  She was found  to be at her baseline mental status, oriented to self alone in the ED, but had an abnormal chest Xray and one recorded pulse oximetry of 87%.  CTA chest showed no PE, but did reveal some patchy opacities that could possible represent early pneumonia.  She was admitted for observation, and overnight remained afebrile, had normal O2 sats, and was hemodynamically stable.  She also had no leukocytosis and undetectable procalcitonin. She had a similar presentation on 12/29-30.  With her radiographic findings and concern for reduced alertness at her facility, empiric treatment for CAP was initiated with  ceftriaxone and azithromycin, and she was discharged to continue 5 days of antibiotics for possible pneumonia with oral azithromycin.  She seems unlikely to have a bacterial pneumonia, but given her risk of complications the benefits of a course of azithromycin were deemed to outweigh the risks.  However, her presentations have been precipitated by her being difficult to arouse overnight and in the early morning, which may be due to her advanced dementia, sedating medications, or postictal state.  2. History of Seizures No reported or observed altered mental status or convulsions, no evidence of seizure.  Postictal state could contribute to her lethargy.  Continued Keppra 500 mg BID.  3.  Pulmonary Nodule Chest CT revealed R apical pulmonary nodule.  Malignancy cannot be excluded.  Given her age, advanced dementia, and goals of care, no further investigation was pursed or recommended.  4. Hypokalemia K of 3.2, repleted with oral KCl.  5. Goals of Care Spoke on the phone with daughter Isabella [name], who expressed that she does not want future invasive testing or treatment for her mother given her dementia and age.  Discussed code status, made DNR.  Will send DNR to [facility].  Discharge Vitals:   BP 129/72 (BP Location: Left Arm)   Pulse 62   Temp 98 F (36.7 C) (Oral)   Resp 17   Wt 119 lb 3.2 oz (54.1 kg)   SpO2 97%   BMI 19.84 kg/m   Pertinent Labs, Studies, and Procedures:  CBC Latest Ref Rng & Units 09/30/2016 09/29/2016 09/28/2016  WBC 4.0 - 10.5 K/uL 4.7 4.3 5.0  Hemoglobin 12.0 - 15.0 g/dL 11.3(L) 12.2 11.5(L)  Hematocrit 36.0 - 46.0 % 34.1(L) 36.6 35.0(L)  Platelets 150 - 400 K/uL 247 281 263   BMP Latest Ref Rng & Units 09/30/2016 09/29/2016 09/28/2016  Glucose 65 - 99 mg/dL 82 76 81  BUN 6 - 20 mg/dL 13 47(W) 12  Creatinine 0.44 - 1.00 mg/dL 2.95 6.21 3.08  Sodium 135 - 145 mmol/L 141 139 140  Potassium 3.5 - 5.1 mmol/L 3.2(L) 3.5 3.0(L)  Chloride 101 - 111 mmol/L 105  102 104  CO2 22 - 32 mmol/L 30 28 27   Calcium 8.9 - 10.3 mg/dL 9.0 9.0 9.4   Component     Latest Ref Rng & Units 09/30/2016  Procalcitonin     ng/mL <0.10   Chest Radiographs AP and Lateral 09/29/2016 IMPRESSION: Heterogeneous opacities left lung base concerning for pneumonia in the appropriate clinical setting. Small left pleural effusion. Followup PA and lateral chest X-ray is recommended in 3-4 weeks following trial of antibiotic therapy to ensure resolution and exclude underlying malignancy.  CTA Chest 09/29/2016 IMPRESSION: No pulmonary embolus.  There is a 1.2 x 1.6 cm nodule in the right apex. Further evaluation with PET CT on outpatient basis is recommended as neoplasm is not excluded.  Small bilateral pleural effusions. Small patchy consolidation of the right  lower lobe and superior aspect of the left lower lobe, developing pneumonia is not excluded.  Atelectasis of the posterior lung bases.  Blood Cultures 09/27/2016 No growth 2 days  Discharge Instructions: Discharge Instructions    Diet - low sodium heart healthy    Complete by:  As directed    Increase activity slowly    Complete by:  As directed      Delerium precautions.  Please try to minimize disturbances overnight, and keep awake with lights on during the day.  Minimize sedating medications like Ativan.  DNR.  Signed: Alm BustardMatthew O'Sullivan, MD 09/30/2016, 10:49 AM   Pager: [phone]

## 2016-09-30 NOTE — Clinical Social Work Note (Addendum)
CSW informed that patient medically stable for discharge back to [facility]. Facility contacted and CSW advised by Elease HashimotoPatricia, staff person that patient can return and d/c summary can come with patient and nurse does not need to call report. [facility] CSW attempted to reach patient's daughter Kevin FentonBeverly Cloninger - [phone] and message left regarding discharge (patient name not used). CSW visited with patient and she is pleasantly confused and was eating her lunch.  Genelle BalVanessa Dianna Ewald, MSW, LCSW Licensed Clinical Social Worker Clinical Social Work Department [facility] [phone]

## 2016-09-30 NOTE — Discharge Instructions (Addendum)
Delerium precautions.  Please try to minimize disturbances overnight, and keep awake with lights on during the day.  Minimize sedating medications like Ativan.  DNR.

## 2016-09-30 NOTE — H&P (Signed)
Date: 09/30/2016               Patient Name:  Isabella [name] MRN: [id number]  DOB: 12/18/1920 Age / Sex: 83 y.o., female   PCP: Mortimer Fries, PA         Medical Service: Internal Medicine Teaching Service         Attending Physician: Dr. Earl Lagos, MD    First Contact: Dr. Peggyann Juba Pager: [phone]  Second Contact: Dr. Earlene Plater Pager: [phone]       After Hours (After 5p/  First Contact Pager: [phone]  weekends / holidays): Second Contact Pager: [phone]   Chief Complaint: Altered Mental Status  History of Present Illness:  Ms. Yamin is a 83yo female with PMH of advanced dementia, seizure disorder on Keppra, HTN presenting to [facility] via ambulance from her ALF after being found to be lethargic - she was difficult to arouse after sleeping; no witnessed seizure activity. She was recently admitted to [facility] on 12/29 for similar presentation which was thought to be due to post-ictal state following breakthrough seizure - at that time no evidence of infection or vascular event could be found to explain her symptoms. At discharge on 12/30 her Keppra was increased to 500mg  BID.   Patient initially evaluated at [facility], upon arrival patient was apparently back to her baseline. At one point patient became hypoxic to high 80's and was placed on oxygen. She was succesfully weaned to RA. ED provided spoke with neurology who suggested loading dose of Keppra 500mg  and to increase Keppra to 750mg  BID. By time of arrival to [facility], patient had received total 1mg  Ativan and 500mg  Keppra.  Meds:  Current Meds  Medication Sig  . acetaminophen (TYLENOL) 650 MG CR tablet Take 650 mg by mouth 3 (three) times daily.   Marland Kitchen aspirin 81 MG chewable tablet Chew 81 mg by mouth daily.  . chlorthalidone (HYGROTON) 25 MG tablet Take 12.5 mg by mouth every morning.  . Cholecalciferol (VITAMIN D) 2000 units tablet Take 2,000 Units by mouth daily.  Marland Kitchen docusate sodium (COLACE) 100 MG capsule Take 100 mg by mouth daily.   Marland Kitchen  levETIRAcetam (KEPPRA) 250 MG tablet Take 250 mg by mouth every evening.  . levETIRAcetam (KEPPRA) 500 MG tablet Take 1 tablet (500 mg total) by mouth 2 (two) times daily. (Patient taking differently: Take 500 mg by mouth every morning. )  . LORazepam (ATIVAN) 0.5 MG tablet Take 0.5 mg by mouth 2 (two) times daily. Also takes 0.5 mg as needed for agitation.  . Melatonin 3 MG CAPS Take 3 mg by mouth at bedtime.  . mirtazapine (REMERON) 15 MG tablet Take 7.5 mg by mouth at bedtime.  . Multiple Vitamin (MULTIVITAMIN) tablet Take 1 tablet by mouth daily.  . naproxen (NAPROSYN) 375 MG tablet Take 375 mg by mouth 2 (two) times daily as needed for mild pain or moderate pain.  Bertram Gala Glycol-Propyl Glycol (SYSTANE) 0.4-0.3 % SOLN Place 1 drop into both eyes 2 (two) times daily.  . potassium chloride SA (K-DUR,KLOR-CON) 20 MEQ tablet Take 20 mEq by mouth daily.  . risperiDONE (RISPERDAL) 0.5 MG tablet Take 0.5 mg by mouth at bedtime.  . rivastigmine (EXELON) 9.5 mg/24hr Place 9.5 mg onto the skin daily.   Allergies: Allergies as of 09/29/2016  . (No Known Allergies)   Past Medical History:  Diagnosis Date  . Dementia   . Hearing loss   . Hypertension   . Seizures (HCC)   .  Vascular dementia   . Weight loss     Family History: Unable to obtain due to dementia  Social History: Unable to obtain due to dementia; Lives in Assisted Living Facility; Daughter is HPOA  Review of Systems: Unable to obtain due to dementia.  Physical Exam: Blood pressure 130/74, pulse 62, temperature 97.7 F (36.5 C), temperature source Axillary, resp. rate 20, weight 119 lb 3.2 oz (54.1 kg), SpO2 96 %. General: intermittently alert and interactive but unable to meaningfully participate in interview or exam Head: normocephalic and atraumatic.  Eyes: vision grossly intact - patient able to track when alert, pupils equal, round, and reactive to light, no injection and anicteric.  Mouth: unable to assess due to  lack of cooperation.  Neck: supple, full ROM..  Lungs: normal respiratory effort, no accessory muscle use; unable to assess fully due to lack of cooperation. She did have a wet cough.  Heart: normal rate, regular rhythm, no murmur, no gallop, and no rub.  Abdomen: soft, non-tender, normal bowel sounds, no distention, no guarding, no rebound tenderness, no hepatomegaly, and no splenomegaly.  Msk: no joint swelling, no joint warmth, and no redness over joints.  Pulses: 2+ DP/PT pulses bilaterally Extremities: No cyanosis, clubbing, edema Neurologic: alert & oriented X1, no facial droop, moves eyes spontaneously, pupils equal and reactive to light, moves all extremities freely  Skin: turgor normal and no rashes.  Psych: Oriented X1, intermittently alert and interactive though unable to meaningfully participate in interview or exam  Labs: WBC 4.3, Hgb 12.2, Hct 36.6, Plts 281 Na 139, K 3.5, Cl 102, CO2 28, BUN 22, Cr 0.62, Glu 76 I stat trop negative LA 2.4  CXR: I personally reviewed the CXR and agree with report - left lung base opacities concerning for pneumonia with small left pleural effusion  CTA: No PE; small bilateral pleural effusions. Small patchy consolidation of RLL and superior aspect of LLL which could represent developing pneumonia  Assessment & Plan by Problem: Active Problems:   CAP (community acquired pneumonia)  Altered Mental Status: Patient reportedly difficult to arouse after sleeping at assisted living facility but at baseline at arrival to ED. This is similar presentation to recent admission thought to be due to post-ictal state. Keppra dosing was increased at discharge which could also cause sedation. Patient is known to be uncooperative so may not be taking her meds which could lead to breakthrough seizure. Labs are unremarkable except for lactic acid to 2.4. CTA suggestive of pneumonia, patient had acute hypoxia that quickly resolved. [facility], patient was  intermittently alert but falling asleep, likely due to sedation from ativan and keppra she received.  --blood cultures from 09/27/2016 NGTD x2 days --f/u UA --Re-assess mental status in AM --received dose of levaquin in ED; continue levaquin for treatment of possible CAP  Possible CAP: Patient with CXR and CTA suggestive of pneumonia; she also had a brief hypoxic episode while in ED and a witnessed wet cough. Infection could be cause of her altered mental status.  --s/p levaquin 500mg  IV x 1 --azithromycin 500mg  IV and ceftriaxone 1g IV x 4 days  Dementia: Continue home risperidone, rivastigmine, and PRN lorazepam for anxiety/agitation 2/2 dementia.   Seizures:  Patient with history of seizures recently with changed dosing of Keppra to 500mg  BID. --continue Keppra 500mg  BID --if concern for AMS 2/2 breakthrough seizures, consider increasing keppra to 750mg  BID per Neuro.   DVT: Lovenox Diet: regular IVF: NS 13000ml/hr x 12 CODE: FULL per  prior   Dispo: Admit patient to Observation with expected length of stay less than 2 midnights.  Signed: Nyra Market, MD 09/30/2016, 2:44 AM  Pager [phone]

## 2016-09-30 NOTE — Progress Notes (Addendum)
   Subjective:  No acute events overnight.  Spoke with daughter Meriam SpragueBeverly on the phone.  Agreed with DNR and comfort care.  She saw her mother yesterday and said she was at baseline.  [facility] [place] spoke [facility] tech, who was not working when [name] was sent to the ED and cannot provide any further details regarding why she was sent to the ED beyond lethargy at night.  Attempted to call SNF provider, PA Mortimer Friesavid Curl with [facility] at [phone] but no answer and no pager provided by facility.  Objective:  Vital signs in last 24 hours: Vitals:   09/30/16 0100 09/30/16 0143 09/30/16 0454 09/30/16 0832  BP: 132/81 130/74 137/75 129/72  Pulse: 69 62 65 62  Resp: 21 20 20 17   Temp:  97.7 F (36.5 C) 97.7 F (36.5 C) 98 F (36.7 C)  TempSrc:  Axillary Oral Oral  SpO2: 96% 96% 98% 97%  Weight:  119 lb 3.2 oz (54.1 kg)     Physical Exam  Constitutional:  Alert, calm, elderly woman lying in bed with safety mitts  Cardiovascular: Normal rate, regular rhythm and normal heart sounds.   Pulmonary/Chest: Effort normal and breath sounds normal, though limited by cooperation.  Abdominal: Soft. She exhibits no distension. There is no tenderness.  Neurological:  Alert, calm, cooperative Oriented only to self Face symmetric No dysarthria Strength grossly intact throughout all extremities   CBC Latest Ref Rng & Units 09/30/2016 09/29/2016 09/28/2016  WBC 4.0 - 10.5 K/uL 4.7 4.3 5.0  Hemoglobin 12.0 - 15.0 g/dL 11.3(L) 12.2 11.5(L)  Hematocrit 36.0 - 46.0 % 34.1(L) 36.6 35.0(L)  Platelets 150 - 400 K/uL 247 281 263   BMP Latest Ref Rng & Units 09/30/2016 09/29/2016 09/28/2016  Glucose 65 - 99 mg/dL 82 76 81  BUN 6 - 20 mg/dL 13 09(W22(H) 12  Creatinine 0.44 - 1.00 mg/dL 1.190.70 1.470.62 8.290.71  Sodium 135 - 145 mmol/L 141 139 140  Potassium 3.5 - 5.1 mmol/L 3.2(L) 3.5 3.0(L)  Chloride 101 - 111 mmol/L 105 102 104  CO2 22 - 32 mmol/L 30 28 27   Calcium 8.9 - 10.3 mg/dL 9.0 9.0 9.4     Assessment/Plan:  Active Problems:   Dementia   CAP (community acquired pneumonia)  83 year old woman with dementia and history of seizure presenting from her memory care facility with concern for lethargy and found with abnormal findings of CXR and chest CT consistent with possible early pneumonia.  She is clinically nontoxic, with normal vitals and labs, and mental status is at baseline.  #CAP Chest radiograph and CT with possible mild infiltrates, but no fever, leukocytosis, hypoxia, or dyspnea. -Complete 5 days antibiotics with azithromycin  #History of Seizures -Continue Keppra to 500 mg BID  Dispo: Anticipated discharge today.  Alm BustardMatthew O'Sullivan, MD 09/30/2016, 12:08 PM Pager: [phone]

## 2016-10-02 LAB — CULTURE, BLOOD (ROUTINE X 2)
CULTURE: NO GROWTH
Culture: NO GROWTH

## 2016-10-02 LAB — LEVETIRACETAM LEVEL: Levetiracetam Lvl: 6.9 ug/mL — ABNORMAL LOW (ref 10.0–40.0)

## 2016-10-09 ENCOUNTER — Emergency Department (HOSPITAL_COMMUNITY)
Admission: EM | Admit: 2016-10-09 | Discharge: 2016-10-09 | Disposition: A | Discharge status: Home / Self Care | Payer: Medicare Other | Attending: Emergency Medicine | Admitting: Emergency Medicine

## 2016-10-09 ENCOUNTER — Encounter (HOSPITAL_COMMUNITY): Payer: Self-pay | Admitting: Emergency Medicine

## 2016-10-09 DIAGNOSIS — R4182 Altered mental status, unspecified: Secondary | ICD-10-CM | POA: Diagnosis not present

## 2016-10-09 DIAGNOSIS — I10 Essential (primary) hypertension: Secondary | ICD-10-CM | POA: Insufficient documentation

## 2016-10-09 DIAGNOSIS — Z79899 Other long term (current) drug therapy: Secondary | ICD-10-CM | POA: Diagnosis not present

## 2016-10-09 DIAGNOSIS — Z7982 Long term (current) use of aspirin: Secondary | ICD-10-CM | POA: Diagnosis not present

## 2016-10-09 NOTE — ED Provider Notes (Addendum)
[facility]-EMERGENCY DEPT Provider Note   CSN: [id number] Arrival date & time: 10/09/16  1759     History   Chief Complaint Chief Complaint  Patient presents with  . Altered Mental Status    HPI Isabella [name] is a 83 y.o. female.  83 yo F with a chief complaints of altered mental status. Per the nursing home they're unable to wake her up. On arrival to the ED patient is alert and oriented at her baseline. She has no complaints. Denies any areas of pain. Denies cough congestion fever. Denies abdominal pain.   The history is provided by the patient.  Altered Mental Status   This is a new problem. The current episode started less than 1 hour ago. The problem has not changed since onset.Associated symptoms include somnolence. Her past medical history does not include head trauma.  Illness  This is a new problem. The current episode started 2 days ago. The problem occurs constantly. The problem has been resolved. Pertinent negatives include no chest pain, no headaches and no shortness of breath. Nothing aggravates the symptoms. Nothing relieves the symptoms.    Past Medical History:  Diagnosis Date  . Dementia   . Hearing loss   . Hypertension   . Seizures (HCC)   . Vascular dementia   . Weight loss     Patient Active Problem List   Diagnosis Date Noted  . CAP (community acquired pneumonia) 09/29/2016  . Post-ictal state (HCC)   . Altered mental status 09/27/2016  . HTN (hypertension) 09/27/2016  . Seizures (HCC) 09/27/2016  . Dementia 04/21/2015    History reviewed. No pertinent surgical history.  OB History    No data available       Home Medications    Prior to Admission medications   Medication Sig Start Date End Date Taking? Authorizing Provider  acetaminophen (TYLENOL) 650 MG CR tablet Take 650 mg by mouth 3 (three) times daily.     Historical Provider, MD  aspirin 81 MG chewable tablet Chew 81 mg by mouth daily.    Historical Provider, MD  chlorthalidone  (HYGROTON) 25 MG tablet Take 12.5 mg by mouth every morning.    Historical Provider, MD  Cholecalciferol (VITAMIN D) 2000 units tablet Take 2,000 Units by mouth daily.    Historical Provider, MD  docusate sodium (COLACE) 100 MG capsule Take 100 mg by mouth daily.     Historical Provider, MD  levETIRAcetam (KEPPRA) 500 MG tablet Take 1 tablet (500 mg total) by mouth 2 (two) times daily. Patient taking differently: Take 500 mg by mouth every morning.  09/28/16   Alm Bustard, MD  LORazepam (ATIVAN) 0.5 MG tablet Take 0.5 mg by mouth 2 (two) times daily. Also takes 0.5 mg as needed for agitation.    Historical Provider, MD  Melatonin 3 MG CAPS Take 3 mg by mouth at bedtime.    Historical Provider, MD  mirtazapine (REMERON) 15 MG tablet Take 7.5 mg by mouth at bedtime.    Historical Provider, MD  Multiple Vitamin (MULTIVITAMIN) tablet Take 1 tablet by mouth daily.    Historical Provider, MD  naproxen (NAPROSYN) 375 MG tablet Take 375 mg by mouth 2 (two) times daily as needed for mild pain or moderate pain.    Historical Provider, MD  OVER THE COUNTER MEDICATION Take 1 Container by mouth daily. Med Pass 2.0    Historical Provider, MD  Polyethyl Glycol-Propyl Glycol (SYSTANE) 0.4-0.3 % SOLN Place 1 drop into both eyes 2 (two) times  daily.    Historical Provider, MD  potassium chloride SA (K-DUR,KLOR-CON) 20 MEQ tablet Take 20 mEq by mouth daily.    Historical Provider, MD  risperiDONE (RISPERDAL) 0.5 MG tablet Take 0.5 mg by mouth at bedtime.    Historical Provider, MD  rivastigmine (EXELON) 9.5 mg/24hr Place 9.5 mg onto the skin daily.    Historical Provider, MD    Family History Family History  Problem Relation Age of Onset  . Family history unknown: Yes    Social History Social History  Substance Use Topics  . Smoking status: Never Smoker  . Smokeless tobacco: Never Used  . Alcohol use No     Allergies   Patient has no known allergies.   Review of Systems Review of Systems    Constitutional: Negative for chills and fever.  HENT: Negative for congestion and rhinorrhea.   Eyes: Negative for redness and visual disturbance.  Respiratory: Negative for shortness of breath and wheezing.   Cardiovascular: Negative for chest pain and palpitations.  Gastrointestinal: Negative for nausea and vomiting.  Genitourinary: Negative for dysuria and urgency.  Musculoskeletal: Negative for arthralgias and myalgias.  Skin: Negative for pallor and wound.  Neurological: Negative for dizziness and headaches.     Physical Exam Updated Vital Signs BP 153/92   Pulse 76   Temp 98.2 F (36.8 C) (Oral)   Resp 18   SpO2 100%   Physical Exam  Constitutional: She is oriented to person, place, and time. She appears well-developed and well-nourished. No distress.  HENT:  Head: Normocephalic and atraumatic.  Eyes: EOM are normal. Pupils are equal, round, and reactive to light.  Neck: Normal range of motion. Neck supple.  Cardiovascular: Normal rate and regular rhythm.  Exam reveals no gallop and no friction rub.   No murmur heard. Pulmonary/Chest: Effort normal. She has no wheezes. She has no rales.  Abdominal: Soft. She exhibits no distension and no mass. There is no tenderness. There is no guarding.  Musculoskeletal: She exhibits no edema or tenderness.  Neurological: She is alert and oriented to person, place, and time.  Skin: Skin is warm and dry. She is not diaphoretic.  Psychiatric: She has a normal mood and affect. Her behavior is normal.  Nursing note and vitals reviewed.    ED Treatments / Results  Labs (all labs ordered are listed, but only abnormal results are displayed) Labs Reviewed - No data to display  EKG  EKG Interpretation None       Radiology No results found.  Procedures Procedures (including critical care time)  Medications Ordered in ED Medications - No data to display   Initial Impression / Assessment and Plan / ED Course  I have  reviewed the triage vital signs and the nursing notes.  Pertinent labs & imaging results that were available during my care of the patient were reviewed by me and considered in my medical decision making (see chart for details).  Clinical Course     83 yo F With a transient altered mental status. No back to baseline. Patient stated that she just won't be left alone and go back to her facility.   6:32 PM:  I have discussed the diagnosis/risks/treatment options with the patient and family and believe the pt to be eligible for discharge home to follow-up with PCP. We also discussed returning to the ED immediately if new or worsening sx occur. We discussed the sx which are most concerning (e.g., sudden worsening pain, fever, inability to tolerate by  mouth) that necessitate immediate return. Medications administered to the patient during their visit and any new prescriptions provided to the patient are listed below.  Medications given during this visit Medications - No data to display   The patient appears reasonably screen and/or stabilized for discharge and I doubt any other medical condition or other [facility] requiring further screening, evaluation, or treatment in the ED at this time prior to discharge.    Final Clinical Impressions(s) / ED Diagnoses   Final diagnoses:  Altered mental status, unspecified altered mental status type    New Prescriptions New Prescriptions   No medications on file     Melene PlanDan Nahla Lukin, DO 10/09/16 1833    Melene Planan Tiernan Suto, DO 10/09/16 [id number]

## 2016-10-09 NOTE — ED Triage Notes (Signed)
[facility] Hx [facility], called f for AMS. Pt responds to verbal stimuli and follows commands per EMS. Alert and oriented to self. Staff reports pt became non verbal and not opening her eyes. No obvious distress.

## 2016-10-09 NOTE — ED Notes (Signed)
Bed: [id number] Expected date:  Expected time:  Means of arrival:  Comments: EMS 83 yo

## 2016-10-09 NOTE — ED Notes (Signed)
No respiratory or acute distress noted awaiting PTAR for transport back to facility, call light in reach alert but confused at times.

## 2017-02-16 ENCOUNTER — Emergency Department (HOSPITAL_COMMUNITY): Payer: Medicare Other

## 2017-02-16 ENCOUNTER — Encounter (HOSPITAL_COMMUNITY): Payer: Self-pay | Admitting: *Deleted

## 2017-02-16 ENCOUNTER — Emergency Department (HOSPITAL_COMMUNITY)
Admission: EM | Admit: 2017-02-16 | Discharge: 2017-02-16 | Disposition: A | Discharge status: Home / Self Care | Payer: Medicare Other | Attending: Emergency Medicine | Admitting: Emergency Medicine

## 2017-02-16 DIAGNOSIS — F0390 Unspecified dementia without behavioral disturbance: Secondary | ICD-10-CM | POA: Diagnosis present

## 2017-02-16 DIAGNOSIS — Z7982 Long term (current) use of aspirin: Secondary | ICD-10-CM | POA: Insufficient documentation

## 2017-02-16 DIAGNOSIS — Y92129 Unspecified place in nursing home as the place of occurrence of the external cause: Secondary | ICD-10-CM | POA: Insufficient documentation

## 2017-02-16 DIAGNOSIS — I10 Essential (primary) hypertension: Secondary | ICD-10-CM | POA: Diagnosis not present

## 2017-02-16 DIAGNOSIS — Y999 Unspecified external cause status: Secondary | ICD-10-CM | POA: Insufficient documentation

## 2017-02-16 DIAGNOSIS — Y939 Activity, unspecified: Secondary | ICD-10-CM | POA: Insufficient documentation

## 2017-02-16 DIAGNOSIS — W19XXXA Unspecified fall, initial encounter: Secondary | ICD-10-CM | POA: Diagnosis not present

## 2017-02-16 NOTE — ED Notes (Addendum)
Called facility[place] [facility]

## 2017-02-16 NOTE — Discharge Instructions (Signed)
As discussed, your evaluation today has been largely reassuring.  But, it is important that you monitor your condition carefully, and do not hesitate to return to the ED if you develop new, or concerning changes in your condition. ? ?Otherwise, please follow-up with your physician for appropriate ongoing care. ? ?

## 2017-02-16 NOTE — ED Notes (Signed)
Dispatch called notified to pick up pt.

## 2017-02-16 NOTE — ED Notes (Signed)
Bed: [id number] Expected date: 02/16/17 Expected time: 7:27 AM Means of arrival: Ambulance Comments: Fall

## 2017-02-16 NOTE — ED Triage Notes (Signed)
[facility] B[facility].  Staff states they heard a crash in the room, pt was found face down. EMS noted there was a trail of urine from the bed to the bathroom. No hematoma or broken skin, deformities found upon EMS assessment, Unable to clear C-spine. Hard of hearing, does not follow directions, she [facility] Pt is distracted by C-Collar d/t comfort.  Last time normal prior to pt going to bed.

## 2017-02-16 NOTE — ED Provider Notes (Signed)
[facility]-EMERGENCY DEPT Provider Note   CSN: [id number] Arrival date & time: 02/16/17  0865     History   Chief Complaint Chief Complaint  Patient presents with  . Fall    HPI Isabella [name] is a 83 y.o. female.  HPI  Patient presents from her nursing facility after a fall.  Patient has dementia, level V caveat. EMS notes that staff upon the patient face down after hearing a loud noise from her room. The patient herself indicates discomfort from the cervical collar, otherwise offers few responses to verbal questioning, but seems to deny pain. At baseline the patient is minimally interactive, does not follow directions reliably.  Past Medical History:  Diagnosis Date  . Dementia   . Hearing loss   . Hypertension   . Seizures (HCC)   . Vascular dementia   . Weight loss     Patient Active Problem List   Diagnosis Date Noted  . CAP (community acquired pneumonia) 09/29/2016  . Post-ictal state (HCC)   . Altered mental status 09/27/2016  . HTN (hypertension) 09/27/2016  . Seizures (HCC) 09/27/2016  . Dementia 04/21/2015    History reviewed. No pertinent surgical history.  OB History    No data available       Home Medications    Prior to Admission medications   Medication Sig Start Date End Date Taking? Authorizing Provider  aspirin 81 MG chewable tablet Chew 81 mg by mouth daily.   Yes [provider]  chlorthalidone (HYGROTON) 25 MG tablet Take 12.5 mg by mouth every morning.   Yes [provider]  Cholecalciferol (VITAMIN D) 2000 units tablet Take 2,000 Units by mouth daily.   Yes [provider]  docusate sodium (COLACE) 100 MG capsule Take 100 mg by mouth daily.    Yes [provider]  levETIRAcetam (KEPPRA) 500 MG tablet Take 1 tablet (500 mg total) by mouth 2 (two) times daily. Patient taking differently: Take 250-500 mg by mouth 2 (two) times daily. 500 mg every morning and 250 mg every evening 09/28/16  Yes Alm Bustard, MD  LORazepam (ATIVAN) 0.5 MG tablet Take 0.5 mg by mouth 2 (two) times daily. Also takes 0.5 mg as needed for agitation.   Yes [provider]  Melatonin 3 MG CAPS Take 3 mg by mouth at bedtime.   Yes [provider]  mirtazapine (REMERON) 15 MG tablet Take 7.5 mg by mouth at bedtime.   Yes [provider]  Multiple Vitamin (MULTIVITAMIN) tablet Take 1 tablet by mouth daily.   Yes [provider]  naproxen (NAPROSYN) 375 MG tablet Take 375 mg by mouth 2 (two) times daily as needed for mild pain or moderate pain.   Yes [provider]  Polyethyl Glycol-Propyl Glycol (SYSTANE) 0.4-0.3 % SOLN Place 1 drop into both eyes 2 (two) times daily.   Yes [provider]  potassium chloride SA (K-DUR,KLOR-CON) 20 MEQ tablet Take 20 mEq by mouth daily.   Yes [provider]  risperiDONE (RISPERDAL) 0.5 MG tablet Take 0.5 mg by mouth at bedtime.   Yes [provider]  rivastigmine (EXELON) 9.5 mg/24hr Place 9.5 mg onto the skin daily.   Yes [provider]    Family History Family History  Problem Relation Age of Onset  . Family history unknown: Yes    Social History Social History  Substance Use Topics  . Smoking status: Never Smoker  . Smokeless tobacco: Never Used  . Alcohol use No  Allergies   Patient has no known allergies.   Review of Systems Review of Systems  Unable to perform ROS: Dementia     Physical Exam Updated Vital Signs BP 109/70 (BP Location: Left Arm)   Pulse 62   Temp 97.3 F (36.3 C) (Axillary)   Resp 17   SpO2 92%   Physical Exam  Constitutional: She appears listless. She appears cachectic. She has a sickly appearance. No distress.  HENT:  Head: Normocephalic and atraumatic.  Eyes: Conjunctivae and EOM are normal.  Cardiovascular: Normal rate and regular rhythm.   Pulmonary/Chest: Effort normal and breath sounds normal. No stridor. No respiratory distress.    Abdominal: She exhibits no distension.  Musculoskeletal: She exhibits no edema.  No gross deformities, patient moves all extremity spontaneously, minimally  Neurological: She appears listless. She displays atrophy. She displays no tremor. She exhibits abnormal muscle tone. She displays no seizure activity.  Minimally interactive, offers few, brief verbal responses, seems to move all extremity spontaneously, but does not follow neurologic commands.  Skin: Skin is warm and dry.  Psychiatric: Cognition and memory are impaired.  Nursing note and vitals reviewed.    ED Treatments / Results   Radiology Ct Head Wo Contrast  Result Date: 02/16/2017 CLINICAL DATA:  Unwitnessed fall a skilled nursing facility. Dementia. Patient found face down. EXAM: CT HEAD WITHOUT CONTRAST CT CERVICAL SPINE WITHOUT CONTRAST TECHNIQUE: Multidetector CT imaging of the head and cervical spine was performed following the standard protocol without intravenous contrast. Multiplanar CT image reconstructions of the cervical spine were also generated. COMPARISON:  CT head without contrast 09/27/2016. CT of the cervical spine 03/15/2016. CT of the chest 09/29/2016 FINDINGS: CT HEAD FINDINGS Brain: Mild generalized atrophy and moderate diffuse white matter disease is similar to the prior exams. Remote lacunar infarcts are present in the thalami bilaterally. No acute infarct, hemorrhage, or mass lesion is present. The ventricles are proportionate to the degree of atrophy. No significant extraaxial fluid collection is present. Vascular: Atherosclerotic calcifications are present within the cavernous internal carotid artery is and the vertebrobasilar artery. There is no focal hyperdense vessel. Extensive basilar calcifications are present. Skull: Calvarium is intact. No acute fractures present. No significant extracranial soft tissue injury is present. Sinuses/Orbits: Paranasal sinuses are clear. The mastoid air cells are chronically  sclerosis. The middle ear cavity is clear bilaterally. Residual mastoid air cells are opacified. Bilateral lens replacements are noted. The globes and orbits are otherwise within normal limits. CT CERVICAL SPINE FINDINGS Alignment: There is straightening of the normal cervical lordosis. Slight anterolisthesis at C7-T1 is stable. AP alignment is otherwise anatomic. Skull base and vertebrae: Chronic endplate sclerotic changes and uncovertebral spurring is again noted C3-4 through C6-7. The craniocervical junction demonstrates mild degenerative change. No acute abnormalities present. No acute fractures are present. A remote superior endplate T2 fracture is stable. Soft tissues and spinal canal: The soft tissues of the neck of are remarkable for atherosclerotic changes bifurcation without definite stenosis. The esophagus is mildly dilated proximally. Disc levels: Osseous foraminal narrowing is evident bilaterally C3-4 through C6-7. This is most significant at the C4-5 level bilaterally. Upper chest: Scarring present at the right lung apex. There is soft tissue component without definite change. PET scan was recommended at the time of the prior chest CT. I do not see a PET scan in our system. No new nodule is present. Centrilobular emphysema is again noted. IMPRESSION: 1. Stable atrophy and moderate white matter disease. This likely reflects the sequela of  chronic microvascular ischemia. 2. No acute intracranial abnormality or evidence for head trauma. 3. Stable multilevel spondylosis of the cervical spine without evidence for acute fracture or traumatic subluxation. 4. Right apical pleuroparenchymal scarring with prominent soft tissue nodule. PET scan is recommended at the time of the last chest CT. There is no definite change. PET scan is still recommended to evaluate for possible right apical lung neoplasm. Electronically Signed   By: Marin Roberts M.D.   On: 02/16/2017 08:53   Ct Cervical Spine Wo  Contrast  Result Date: 02/16/2017 CLINICAL DATA:  Unwitnessed fall a skilled nursing facility. Dementia. Patient found face down. EXAM: CT HEAD WITHOUT CONTRAST CT CERVICAL SPINE WITHOUT CONTRAST TECHNIQUE: Multidetector CT imaging of the head and cervical spine was performed following the standard protocol without intravenous contrast. Multiplanar CT image reconstructions of the cervical spine were also generated. COMPARISON:  CT head without contrast 09/27/2016. CT of the cervical spine 03/15/2016. CT of the chest 09/29/2016 FINDINGS: CT HEAD FINDINGS Brain: Mild generalized atrophy and moderate diffuse white matter disease is similar to the prior exams. Remote lacunar infarcts are present in the thalami bilaterally. No acute infarct, hemorrhage, or mass lesion is present. The ventricles are proportionate to the degree of atrophy. No significant extraaxial fluid collection is present. Vascular: Atherosclerotic calcifications are present within the cavernous internal carotid artery is and the vertebrobasilar artery. There is no focal hyperdense vessel. Extensive basilar calcifications are present. Skull: Calvarium is intact. No acute fractures present. No significant extracranial soft tissue injury is present. Sinuses/Orbits: Paranasal sinuses are clear. The mastoid air cells are chronically sclerosis. The middle ear cavity is clear bilaterally. Residual mastoid air cells are opacified. Bilateral lens replacements are noted. The globes and orbits are otherwise within normal limits. CT CERVICAL SPINE FINDINGS Alignment: There is straightening of the normal cervical lordosis. Slight anterolisthesis at C7-T1 is stable. AP alignment is otherwise anatomic. Skull base and vertebrae: Chronic endplate sclerotic changes and uncovertebral spurring is again noted C3-4 through C6-7. The craniocervical junction demonstrates mild degenerative change. No acute abnormalities present. No acute fractures are present. A remote  superior endplate T2 fracture is stable. Soft tissues and spinal canal: The soft tissues of the neck of are remarkable for atherosclerotic changes bifurcation without definite stenosis. The esophagus is mildly dilated proximally. Disc levels: Osseous foraminal narrowing is evident bilaterally C3-4 through C6-7. This is most significant at the C4-5 level bilaterally. Upper chest: Scarring present at the right lung apex. There is soft tissue component without definite change. PET scan was recommended at the time of the prior chest CT. I do not see a PET scan in our system. No new nodule is present. Centrilobular emphysema is again noted. IMPRESSION: 1. Stable atrophy and moderate white matter disease. This likely reflects the sequela of chronic microvascular ischemia. 2. No acute intracranial abnormality or evidence for head trauma. 3. Stable multilevel spondylosis of the cervical spine without evidence for acute fracture or traumatic subluxation. 4. Right apical pleuroparenchymal scarring with prominent soft tissue nodule. PET scan is recommended at the time of the last chest CT. There is no definite change. PET scan is still recommended to evaluate for possible right apical lung neoplasm. Electronically Signed   By: Marin Roberts M.D.   On: 02/16/2017 08:53    Procedures Procedures (including critical care time)    Initial Impression / Assessment and Plan / ED Course  I have reviewed the triage vital signs and the nursing notes.  Pertinent labs &  imaging results that were available during my care of the patient were reviewed by me and considered in my medical decision making (see chart for details).  On repeat exam I removed the patient's cervical spine collar, she appears calm, vital signs unremarkable.  With reassuring CT scans, patient's noted history of dementia, low baseline interactivity, patient will be returned to her nursing facility.   Final Clinical Impressions(s) / ED Diagnoses    Final diagnoses:  Fall, initial encounter     Gerhard Munch, MD 02/16/17 [phone]

## 2017-02-16 NOTE — ED Notes (Signed)
Family at bedside. Pt daughter will transport to facility.

## 2017-03-10 ENCOUNTER — Emergency Department (HOSPITAL_COMMUNITY): Payer: Medicare Other

## 2017-03-10 ENCOUNTER — Encounter (HOSPITAL_COMMUNITY): Payer: Self-pay

## 2017-03-10 ENCOUNTER — Emergency Department (HOSPITAL_COMMUNITY)
Admission: EM | Admit: 2017-03-10 | Discharge: 2017-03-10 | Disposition: A | Discharge status: Home / Self Care | Payer: Medicare Other | Attending: Emergency Medicine | Admitting: Emergency Medicine

## 2017-03-10 DIAGNOSIS — Y92122 Bedroom in nursing home as the place of occurrence of the external cause: Secondary | ICD-10-CM | POA: Insufficient documentation

## 2017-03-10 DIAGNOSIS — W06XXXA Fall from bed, initial encounter: Secondary | ICD-10-CM | POA: Insufficient documentation

## 2017-03-10 DIAGNOSIS — Y939 Activity, unspecified: Secondary | ICD-10-CM | POA: Diagnosis not present

## 2017-03-10 DIAGNOSIS — S0990XA Unspecified injury of head, initial encounter: Secondary | ICD-10-CM

## 2017-03-10 DIAGNOSIS — Y999 Unspecified external cause status: Secondary | ICD-10-CM | POA: Insufficient documentation

## 2017-03-10 MED ORDER — FLUORESCEIN SODIUM 0.6 MG OP STRP
1.0000 | ORAL_STRIP | Freq: Once | OPHTHALMIC | Status: AC
  Administered 2017-03-10: 1 via OPHTHALMIC
  Filled 2017-03-10: qty 1

## 2017-03-10 MED ORDER — TETRACAINE HCL 0.5 % OP SOLN
1.0000 [drp] | Freq: Once | OPHTHALMIC | Status: AC
  Administered 2017-03-10: 1 [drp] via OPHTHALMIC
  Filled 2017-03-10: qty 2

## 2017-03-10 NOTE — ED Notes (Signed)
PA at the bedside.

## 2017-03-10 NOTE — Discharge Instructions (Signed)
It was my pleasure taking care of you today!   Your imaging today was very reassuring. There were no acute findings on your CT head, cervical spine or maxillofacial to suggest an injury from your fall. Your CT scan did show changes that you may want to see your primary care physician to discuss. "Right apical parenchymal changes without change from prior examination. PET-CT would be necessary for further delineation (as noted on two most recent examinations) to evaluate for possibility of malignancy."  Follow up with your primary care provider as needed. Return to ER for new or worsening symptoms, any additional concerns.

## 2017-03-10 NOTE — ED Provider Notes (Signed)
[facility]-EMERGENCY DEPT Provider Note   CSN: [id number] Arrival date & time: 03/10/17  0707     History   Chief Complaint Chief Complaint  Patient presents with  . Fall    HPI Isabella [name] is a 83 y.o. female.  The history is provided by the nursing home, the EMS personnel and medical records. No language interpreter was used.  Fall    Isabella [name] is a 83 y.o. female  with a PMH of dementia, HTN, seizures who presents to the Emergency Department for evaluation after unwitnessed fall this morning. Per facility, patient was found next to her bed this morning, thought to have rolled out of bed or was trying to get out of bed. Patient unable to contribute to history. Facility states that she is at her baseline mental status. CBG was 100.   Level V caveat applies 2/2 demenita  Past Medical History:  Diagnosis Date  . Dementia   . Hearing loss   . Hypertension   . Seizures (HCC)   . Vascular dementia   . Weight loss     Patient Active Problem List   Diagnosis Date Noted  . CAP (community acquired pneumonia) 09/29/2016  . Post-ictal state (HCC)   . Altered mental status 09/27/2016  . HTN (hypertension) 09/27/2016  . Seizures (HCC) 09/27/2016  . Dementia 04/21/2015    History reviewed. No pertinent surgical history.  OB History    No data available       Home Medications    Prior to Admission medications   Medication Sig Start Date End Date Taking? Authorizing Provider  aspirin 81 MG chewable tablet Chew 81 mg by mouth daily.    [provider]  chlorthalidone (HYGROTON) 25 MG tablet Take 12.5 mg by mouth every morning.    [provider]  Cholecalciferol (VITAMIN D) 2000 units tablet Take 2,000 Units by mouth daily.    [provider]  docusate sodium (COLACE) 100 MG capsule Take 100 mg by mouth daily.     [provider]  levETIRAcetam (KEPPRA) 500 MG tablet Take 1 tablet (500 mg total) by mouth 2 (two) times daily. Patient  taking differently: Take 250-500 mg by mouth 2 (two) times daily. 500 mg every morning and 250 mg every evening 09/28/16   Alm Bustard, MD  LORazepam (ATIVAN) 0.5 MG tablet Take 0.5 mg by mouth 2 (two) times daily. Also takes 0.5 mg as needed for agitation.    [provider]  Melatonin 3 MG CAPS Take 3 mg by mouth at bedtime.    [provider]  mirtazapine (REMERON) 15 MG tablet Take 7.5 mg by mouth at bedtime.    [provider]  Multiple Vitamin (MULTIVITAMIN) tablet Take 1 tablet by mouth daily.    [provider]  naproxen (NAPROSYN) 375 MG tablet Take 375 mg by mouth 2 (two) times daily as needed for mild pain or moderate pain.    [provider]  Polyethyl Glycol-Propyl Glycol (SYSTANE) 0.4-0.3 % SOLN Place 1 drop into both eyes 2 (two) times daily.    [provider]  potassium chloride SA (K-DUR,KLOR-CON) 20 MEQ tablet Take 20 mEq by mouth daily.    [provider]  risperiDONE (RISPERDAL) 0.5 MG tablet Take 0.5 mg by mouth at bedtime.    [provider]  rivastigmine (EXELON) 9.5 mg/24hr Place 9.5 mg onto the skin daily.    [provider]    Family History Family History  Problem Relation Age  of Onset  . Family history unknown: Yes    Social History Social History  Substance Use Topics  . Smoking status: Never Smoker  . Smokeless tobacco: Never Used  . Alcohol use No     Allergies   Patient has no known allergies.   Review of Systems Review of Systems  Unable to perform ROS: Dementia     Physical Exam Updated Vital Signs BP 134/85   Pulse 63   Temp 98 F (36.7 C) (Oral)   Resp 17   Ht 5\' 6"  (1.676 m)   Wt 54.4 kg (120 lb)   SpO2 98%   BMI 19.37 kg/m   Physical Exam  Constitutional: She appears well-developed and well-nourished. No distress.  HENT:  Head: Normocephalic. Head is without raccoon's eyes and without Battle's sign.    Right Ear: No hemotympanum.    Left Ear: No hemotympanum.  Nose: Nose normal.  Eyes:  Left pupil round. Rght pupil slightly irregular, more of an oval shape. Both pupils reactive to light and accomodation. Tracks across the room appropriately. Right eye stained with no fluorescein uptake. No signs of trauma / globe rupture.   Cardiovascular: Normal rate, regular rhythm and normal heart sounds.   No murmur heard. Pulmonary/Chest: Effort normal and breath sounds normal. No respiratory distress.  Abdominal: Soft. She exhibits no distension. There is no tenderness.  Neurological: She is alert.  Moves all extremities spontaneously. Alert. Responds intermittently to commands. Per facility, this is baseline.  Skin: Skin is warm and dry.  Nursing note and vitals reviewed.    ED Treatments / Results  Labs (all labs ordered are listed, but only abnormal results are displayed) Labs Reviewed - No data to display  EKG  EKG Interpretation None       Radiology Ct Head Wo Contrast  Result Date: 03/10/2017 CLINICAL DATA:  83 year old hypertensive female with unwitnessed fall found lying on floor next to bed. Dementia. Initial encounter. EXAM: CT HEAD WITHOUT CONTRAST CT MAXILLOFACIAL WITHOUT CONTRAST CT CERVICAL SPINE WITHOUT CONTRAST TECHNIQUE: Multidetector CT imaging of the head, cervical spine, and maxillofacial structures were performed using the standard protocol without intravenous contrast. Multiplanar CT image reconstructions of the cervical spine and maxillofacial structures were also generated. COMPARISON:  02/16/2017. FINDINGS: CT HEAD FINDINGS Brain: No intracranial hemorrhage or CT evidence of large acute infarct. Prominent chronic microvascular changes. Global atrophy without hydrocephalus. No intracranial mass lesion noted on this unenhanced exam. Vascular: Vascular calcifications with ectatic basilar artery. Skull: No skull fracture. Other: Right posterior frontal subcutaneous hematoma. Chronic opacification  mastoid air cells and middle ear cavities. CT MAXILLOFACIAL FINDINGS Osseous: No fracture Orbits: No fracture or acute abnormality. Sinuses: Minimal mucosal thickening maxillary sinuses. Soft tissues: No acute abnormality. CT CERVICAL SPINE FINDINGS Alignment: No significant change since prior exam. Skull base and vertebrae: No cervical spine fracture. Remote Schmorl's node deformity superior endplate T2 and T3. Prominent degenerative changes C3-4 thru C5-6. Soft tissues and spinal canal: No abnormal prevertebral soft tissue swelling. Degenerative changes C3-4 thru C6-7 with impression upon the ventral cord most notable C3-4 level. Upper chest: Right apical parenchymal changes without change from prior examination. PET-CT would be necessary for further delineation (as noted on two most recent examinations) to exclude underlying malignancy. Other: Carotid bifurcation calcifications. IMPRESSION: CT HEAD Right posterior frontal subcutaneous hematoma without underlying fracture or intracranial hemorrhage. Prominent chronic microvascular changes. CT MAXILLOFACIAL No fracture. CT CERVICAL SPINE No cervical spine fracture or abnormal prevertebral soft tissue swelling. Remote Schmorl's node  deformity superior endplate T2 and T3. Degenerative changes C3-4 thru C6-7 with impression upon the ventral cord most notable C3-4 level, unchanged. Right apical parenchymal changes without change from prior examination. PET-CT would be necessary for further delineation (as noted on two most recent examinations) to evaluate for possibility of malignancy. Electronically Signed   By: Lacy DuverneySteven  Olson M.D.   On: 03/10/2017 08:16   Ct Cervical Spine Wo Contrast  Result Date: 03/10/2017 CLINICAL DATA:  83 year old hypertensive female with unwitnessed fall found lying on floor next to bed. Dementia. Initial encounter. EXAM: CT HEAD WITHOUT CONTRAST CT MAXILLOFACIAL WITHOUT CONTRAST CT CERVICAL SPINE WITHOUT CONTRAST TECHNIQUE: Multidetector  CT imaging of the head, cervical spine, and maxillofacial structures were performed using the standard protocol without intravenous contrast. Multiplanar CT image reconstructions of the cervical spine and maxillofacial structures were also generated. COMPARISON:  02/16/2017. FINDINGS: CT HEAD FINDINGS Brain: No intracranial hemorrhage or CT evidence of large acute infarct. Prominent chronic microvascular changes. Global atrophy without hydrocephalus. No intracranial mass lesion noted on this unenhanced exam. Vascular: Vascular calcifications with ectatic basilar artery. Skull: No skull fracture. Other: Right posterior frontal subcutaneous hematoma. Chronic opacification mastoid air cells and middle ear cavities. CT MAXILLOFACIAL FINDINGS Osseous: No fracture Orbits: No fracture or acute abnormality. Sinuses: Minimal mucosal thickening maxillary sinuses. Soft tissues: No acute abnormality. CT CERVICAL SPINE FINDINGS Alignment: No significant change since prior exam. Skull base and vertebrae: No cervical spine fracture. Remote Schmorl's node deformity superior endplate T2 and T3. Prominent degenerative changes C3-4 thru C5-6. Soft tissues and spinal canal: No abnormal prevertebral soft tissue swelling. Degenerative changes C3-4 thru C6-7 with impression upon the ventral cord most notable C3-4 level. Upper chest: Right apical parenchymal changes without change from prior examination. PET-CT would be necessary for further delineation (as noted on two most recent examinations) to exclude underlying malignancy. Other: Carotid bifurcation calcifications. IMPRESSION: CT HEAD Right posterior frontal subcutaneous hematoma without underlying fracture or intracranial hemorrhage. Prominent chronic microvascular changes. CT MAXILLOFACIAL No fracture. CT CERVICAL SPINE No cervical spine fracture or abnormal prevertebral soft tissue swelling. Remote Schmorl's node deformity superior endplate T2 and T3. Degenerative changes C3-4  thru C6-7 with impression upon the ventral cord most notable C3-4 level, unchanged. Right apical parenchymal changes without change from prior examination. PET-CT would be necessary for further delineation (as noted on two most recent examinations) to evaluate for possibility of malignancy. Electronically Signed   By: Lacy DuverneySteven  Olson M.D.   On: 03/10/2017 08:16   Ct Maxillofacial Wo Contrast  Result Date: 03/10/2017 CLINICAL DATA:  83 year old hypertensive female with unwitnessed fall found lying on floor next to bed. Dementia. Initial encounter. EXAM: CT HEAD WITHOUT CONTRAST CT MAXILLOFACIAL WITHOUT CONTRAST CT CERVICAL SPINE WITHOUT CONTRAST TECHNIQUE: Multidetector CT imaging of the head, cervical spine, and maxillofacial structures were performed using the standard protocol without intravenous contrast. Multiplanar CT image reconstructions of the cervical spine and maxillofacial structures were also generated. COMPARISON:  02/16/2017. FINDINGS: CT HEAD FINDINGS Brain: No intracranial hemorrhage or CT evidence of large acute infarct. Prominent chronic microvascular changes. Global atrophy without hydrocephalus. No intracranial mass lesion noted on this unenhanced exam. Vascular: Vascular calcifications with ectatic basilar artery. Skull: No skull fracture. Other: Right posterior frontal subcutaneous hematoma. Chronic opacification mastoid air cells and middle ear cavities. CT MAXILLOFACIAL FINDINGS Osseous: No fracture Orbits: No fracture or acute abnormality. Sinuses: Minimal mucosal thickening maxillary sinuses. Soft tissues: No acute abnormality. CT CERVICAL SPINE FINDINGS Alignment: No significant change since prior exam. Skull  base and vertebrae: No cervical spine fracture. Remote Schmorl's node deformity superior endplate T2 and T3. Prominent degenerative changes C3-4 thru C5-6. Soft tissues and spinal canal: No abnormal prevertebral soft tissue swelling. Degenerative changes C3-4 thru C6-7 with  impression upon the ventral cord most notable C3-4 level. Upper chest: Right apical parenchymal changes without change from prior examination. PET-CT would be necessary for further delineation (as noted on two most recent examinations) to exclude underlying malignancy. Other: Carotid bifurcation calcifications. IMPRESSION: CT HEAD Right posterior frontal subcutaneous hematoma without underlying fracture or intracranial hemorrhage. Prominent chronic microvascular changes. CT MAXILLOFACIAL No fracture. CT CERVICAL SPINE No cervical spine fracture or abnormal prevertebral soft tissue swelling. Remote Schmorl's node deformity superior endplate T2 and T3. Degenerative changes C3-4 thru C6-7 with impression upon the ventral cord most notable C3-4 level, unchanged. Right apical parenchymal changes without change from prior examination. PET-CT would be necessary for further delineation (as noted on two most recent examinations) to evaluate for possibility of malignancy. Electronically Signed   By: Lacy Duverney M.D.   On: 03/10/2017 08:16    Procedures Procedures (including critical care time)  Medications Ordered in ED Medications  tetracaine (PONTOCAINE) 0.5 % ophthalmic solution 1 drop (1 drop Right Eye Given 03/10/17 0752)  fluorescein ophthalmic strip 1 strip (1 strip Right Eye Given 03/10/17 0752)     Initial Impression / Assessment and Plan / ED Course  I have reviewed the triage vital signs and the nursing notes.  Pertinent labs & imaging results that were available during my care of the patient were reviewed by me and considered in my medical decision making (see chart for details).    Isabella [name] is a 83 y.o. female who presents to ED for evaluation following unwitnessed fall. Slight irregularity to right pupil, possibly from prior surgery, however eye was thoroughly examined and stained. No signs of trauma to the eye with stain. Pupils reactive. CT head/cervical/maxillofacial with no acute  injury. Will discharge back to facility.  Patient seen by and discussed with Dr. Adela Lank who agrees with treatment plan.    Final Clinical Impressions(s) / ED Diagnoses   Final diagnoses:  Injury of head, initial encounter    New Prescriptions New Prescriptions   No medications on file     Mikisha Roseland, Chase Picket, PA-C 03/10/17 0838    Melene Plan, DO 03/10/17 [phone]

## 2017-03-10 NOTE — ED Triage Notes (Signed)
Per [facility], Pt is coming [facility] with unwitnessed fall. Pt was lying beside her bed in urine when staff found her. Unknown LSN. Staff reports patient being at her baseline at this time. Pt has hx of dementia and oriented to self. Small hematoma noted to her posterior right head. Vitals 141/84, 60 HR, 100 CBG

## 2017-03-26 ENCOUNTER — Encounter (HOSPITAL_COMMUNITY): Payer: Self-pay | Admitting: Emergency Medicine

## 2017-03-26 ENCOUNTER — Emergency Department (HOSPITAL_COMMUNITY)
Admission: EM | Admit: 2017-03-26 | Discharge: 2017-03-26 | Disposition: A | Discharge status: Home / Self Care | Payer: Medicare Other | Attending: Emergency Medicine | Admitting: Emergency Medicine

## 2017-03-26 ENCOUNTER — Emergency Department (HOSPITAL_COMMUNITY): Payer: Medicare Other

## 2017-03-26 DIAGNOSIS — Y999 Unspecified external cause status: Secondary | ICD-10-CM | POA: Diagnosis not present

## 2017-03-26 DIAGNOSIS — Y939 Activity, unspecified: Secondary | ICD-10-CM | POA: Diagnosis not present

## 2017-03-26 DIAGNOSIS — W1839XA Other fall on same level, initial encounter: Secondary | ICD-10-CM | POA: Diagnosis not present

## 2017-03-26 DIAGNOSIS — Y92001 Dining room of unspecified non-institutional (private) residence as the place of occurrence of the external cause: Secondary | ICD-10-CM | POA: Diagnosis not present

## 2017-03-26 DIAGNOSIS — I10 Essential (primary) hypertension: Secondary | ICD-10-CM | POA: Diagnosis not present

## 2017-03-26 DIAGNOSIS — Z7982 Long term (current) use of aspirin: Secondary | ICD-10-CM | POA: Insufficient documentation

## 2017-03-26 DIAGNOSIS — R0781 Pleurodynia: Secondary | ICD-10-CM

## 2017-03-26 DIAGNOSIS — S40022A Contusion of left upper arm, initial encounter: Secondary | ICD-10-CM | POA: Insufficient documentation

## 2017-03-26 NOTE — ED Notes (Signed)
Family at bedside. 

## 2017-03-26 NOTE — ED Triage Notes (Signed)
Per EMS, [facility] from [facility], staff reports patient had fall on 6/24 in the dining room after "loosing her balance." Family did not want patient to be seen at that time. Today patient presents with bruise to left upper arm and pain to left rib. Alert and oriented to baseline. Denies head injury and LOC. Denies blood thinners. Hx dementia.  BP 120/70 HR 80 RR 14  O2 97%

## 2017-03-26 NOTE — ED Notes (Signed)
ED Provider at bedside. 

## 2017-03-26 NOTE — ED Provider Notes (Signed)
[facility]-EMERGENCY DEPT Provider Note   CSN: [id number] Arrival date & time: 03/26/17  1922     History   Chief Complaint Chief Complaint  Patient presents with  . Fall    HPI Isabella [name] is a 83 y.o. female.  Patient with history of dementia from facility after a fall reportedly 3 days ago. Patient developed a bruise on her left upper arm and has reported left rib tenderness. Patient sent to the emergency department for this. Level V caveat due to dementia.      Past Medical History:  Diagnosis Date  . Dementia   . Hearing loss   . Hypertension   . Seizures (HCC)   . Vascular dementia   . Weight loss     Patient Active Problem List   Diagnosis Date Noted  . CAP (community acquired pneumonia) 09/29/2016  . Post-ictal state (HCC)   . Altered mental status 09/27/2016  . HTN (hypertension) 09/27/2016  . Seizures (HCC) 09/27/2016  . Dementia 04/21/2015    History reviewed. No pertinent surgical history.  OB History    No data available       Home Medications    Prior to Admission medications   Medication Sig Start Date End Date Taking? Authorizing Provider  aspirin 81 MG chewable tablet Chew 81 mg by mouth daily.    [provider]  chlorthalidone (HYGROTON) 25 MG tablet Take 12.5 mg by mouth every morning.    [provider]  Cholecalciferol (VITAMIN D) 2000 units tablet Take 2,000 Units by mouth daily.    [provider]  docusate sodium (COLACE) 100 MG capsule Take 100 mg by mouth daily.     [provider]  levETIRAcetam (KEPPRA) 500 MG tablet Take 1 tablet (500 mg total) by mouth 2 (two) times daily. Patient taking differently: Take 250-500 mg by mouth 2 (two) times daily. 500 mg every morning and 250 mg every evening 09/28/16   Alm Bustard, MD  LORazepam (ATIVAN) 0.5 MG tablet Take 0.5 mg by mouth 2 (two) times daily. Also takes 0.5 mg as needed for agitation.    [provider]  Melatonin 3 MG CAPS Take  3 mg by mouth at bedtime.    [provider]  mirtazapine (REMERON) 15 MG tablet Take 7.5 mg by mouth at bedtime.    [provider]  Multiple Vitamin (MULTIVITAMIN) tablet Take 1 tablet by mouth daily.    [provider]  naproxen (NAPROSYN) 375 MG tablet Take 375 mg by mouth 2 (two) times daily as needed for mild pain or moderate pain.    [provider]  Polyethyl Glycol-Propyl Glycol (SYSTANE) 0.4-0.3 % SOLN Place 1 drop into both eyes 2 (two) times daily.    [provider]  potassium chloride SA (K-DUR,KLOR-CON) 20 MEQ tablet Take 20 mEq by mouth daily.    [provider]  risperiDONE (RISPERDAL) 0.5 MG tablet Take 0.5 mg by mouth at bedtime.    [provider]  rivastigmine (EXELON) 9.5 mg/24hr Place 9.5 mg onto the skin daily.    [provider]    Family History Family History  Problem Relation Age of Onset  . Family history unknown: Yes    Social History Social History  Substance Use Topics  . Smoking status: Never Smoker  . Smokeless tobacco: Never Used  . Alcohol use No     Allergies   Patient has no known allergies.   Review of Systems Review of Systems  Unable  to perform ROS: Dementia     Physical Exam Updated Vital Signs BP 120/79 (BP Location: Right Arm)   Pulse 86   Temp 98.5 F (36.9 C) (Oral)   Resp 19   SpO2 99%   Physical Exam  Constitutional: She appears well-developed and well-nourished.  HENT:  Head: Normocephalic and atraumatic.  Eyes: Conjunctivae are normal. Right eye exhibits no discharge. Left eye exhibits no discharge.  Neck: Normal range of motion. Neck supple.  Cardiovascular: Normal rate, regular rhythm and normal heart sounds.   Pulmonary/Chest: Effort normal and breath sounds normal. She exhibits no tenderness.  No reproducible chest wall tenderness, palpable deformity, ecchymosis.  Abdominal: Soft. There is no tenderness.  Musculoskeletal:       Right  shoulder: Normal.       Left shoulder: Normal.       Cervical back: Normal.       Right upper arm: Normal.       Left upper arm: She exhibits no tenderness, no swelling and no edema.       Arms: Neurological: She is alert.  Confused speech consistent with dementia. Patient does not follow commands.  Skin: Skin is warm and dry.  Psychiatric: She has a normal mood and affect.  Nursing note and vitals reviewed.    ED Treatments / Results   Procedures Procedures (including critical care time)  Medications Ordered in ED Medications - No data to display   Initial Impression / Assessment and Plan / ED Course  I have reviewed the triage vital signs and the nursing notes.  Pertinent labs & imaging results that were available during my care of the patient were reviewed by me and considered in my medical decision making (see chart for details).     Patient seen and examined. Imaging ordered.   Vital signs reviewed and are as follows: BP 120/79 (BP Location: Right Arm)   Pulse 86   Temp 98.5 F (36.9 C) (Oral)   Resp 19   SpO2 99%   Patient discussed with and seen by Dr. Jacqulyn BathLong.  Imaging is negative. Conservative measures indicated. Will discharge back to nursing facility.  Final Clinical Impressions(s) / ED Diagnoses   Final diagnoses:  Traumatic ecchymosis of left upper arm, initial encounter  Rib pain   Patient with history of dementia, fall 3 days ago. Patient evaluated tonight for rib pain and left upper extremity ecchymosis. No evidence of fracture on imaging. Patient is using her arm well. Will continue conservative measures and discharge back to facility.  New Prescriptions  New Prescriptions   No medications on file     Renne CriglerGeiple, Fredis Malkiewicz, Cordelia Poche-C 03/26/17 2215    Maia PlanLong, Pedro Oldenburg G, MD 03/27/17 [id number]

## 2017-03-26 NOTE — ED Notes (Signed)
Pt is being taken to X-Ray.

## 2017-03-26 NOTE — ED Notes (Signed)
Bed: [id number] Expected date:  Expected time:  Means of arrival:  Comments: [id number] bruising

## 2017-03-26 NOTE — Discharge Instructions (Signed)
X-ray was performed of the left upper arm and left ribs, no broken bones or fractures identified.

## 2017-08-16 ENCOUNTER — Emergency Department (HOSPITAL_COMMUNITY): Payer: Medicare Other

## 2017-08-16 ENCOUNTER — Other Ambulatory Visit: Payer: Self-pay

## 2017-08-16 ENCOUNTER — Encounter (HOSPITAL_COMMUNITY): Payer: Self-pay | Admitting: *Deleted

## 2017-08-16 ENCOUNTER — Emergency Department (HOSPITAL_COMMUNITY)
Admission: EM | Admit: 2017-08-16 | Discharge: 2017-08-16 | Disposition: A | Discharge status: Home / Self Care | Payer: Medicare Other | Attending: Emergency Medicine | Admitting: Emergency Medicine

## 2017-08-16 DIAGNOSIS — Z79899 Other long term (current) drug therapy: Secondary | ICD-10-CM | POA: Diagnosis not present

## 2017-08-16 DIAGNOSIS — F0150 Vascular dementia without behavioral disturbance: Secondary | ICD-10-CM | POA: Insufficient documentation

## 2017-08-16 DIAGNOSIS — I10 Essential (primary) hypertension: Secondary | ICD-10-CM | POA: Diagnosis not present

## 2017-08-16 DIAGNOSIS — J40 Bronchitis, not specified as acute or chronic: Secondary | ICD-10-CM | POA: Diagnosis not present

## 2017-08-16 DIAGNOSIS — R0789 Other chest pain: Secondary | ICD-10-CM | POA: Insufficient documentation

## 2017-08-16 DIAGNOSIS — Z7982 Long term (current) use of aspirin: Secondary | ICD-10-CM | POA: Diagnosis not present

## 2017-08-16 DIAGNOSIS — R109 Unspecified abdominal pain: Secondary | ICD-10-CM | POA: Diagnosis present

## 2017-08-16 LAB — URINALYSIS, ROUTINE W REFLEX MICROSCOPIC
BILIRUBIN URINE: NEGATIVE
Glucose, UA: NEGATIVE mg/dL
HGB URINE DIPSTICK: NEGATIVE
Ketones, ur: NEGATIVE mg/dL
Leukocytes, UA: NEGATIVE
Nitrite: NEGATIVE
PH: 6 (ref 5.0–8.0)
Protein, ur: NEGATIVE mg/dL
SPECIFIC GRAVITY, URINE: 1.021 (ref 1.005–1.030)

## 2017-08-16 LAB — COMPREHENSIVE METABOLIC PANEL
ALK PHOS: 58 U/L (ref 38–126)
ALT: 22 U/L (ref 14–54)
ANION GAP: 7 (ref 5–15)
AST: 26 U/L (ref 15–41)
Albumin: 3.4 g/dL — ABNORMAL LOW (ref 3.5–5.0)
BILIRUBIN TOTAL: 0.6 mg/dL (ref 0.3–1.2)
BUN: 21 mg/dL — ABNORMAL HIGH (ref 6–20)
CALCIUM: 8.9 mg/dL (ref 8.9–10.3)
CO2: 30 mmol/L (ref 22–32)
CREATININE: 0.73 mg/dL (ref 0.44–1.00)
Chloride: 102 mmol/L (ref 101–111)
Glucose, Bld: 90 mg/dL (ref 65–99)
Potassium: 3.3 mmol/L — ABNORMAL LOW (ref 3.5–5.1)
SODIUM: 139 mmol/L (ref 135–145)
TOTAL PROTEIN: 6.6 g/dL (ref 6.5–8.1)

## 2017-08-16 LAB — CBC WITH DIFFERENTIAL/PLATELET
Basophils Absolute: 0 10*3/uL (ref 0.0–0.1)
Basophils Relative: 0 %
EOS ABS: 0.1 10*3/uL (ref 0.0–0.7)
EOS PCT: 1 %
HCT: 36.6 % (ref 36.0–46.0)
HEMOGLOBIN: 11.9 g/dL — AB (ref 12.0–15.0)
LYMPHS ABS: 1.2 10*3/uL (ref 0.7–4.0)
LYMPHS PCT: 15 %
MCH: 31.2 pg (ref 26.0–34.0)
MCHC: 32.5 g/dL (ref 30.0–36.0)
MCV: 96.1 fL (ref 78.0–100.0)
MONOS PCT: 6 %
Monocytes Absolute: 0.5 10*3/uL (ref 0.1–1.0)
NEUTROS PCT: 78 %
Neutro Abs: 6.3 10*3/uL (ref 1.7–7.7)
Platelets: 247 10*3/uL (ref 150–400)
RBC: 3.81 MIL/uL — ABNORMAL LOW (ref 3.87–5.11)
RDW: 13.7 % (ref 11.5–15.5)
WBC: 8.1 10*3/uL (ref 4.0–10.5)

## 2017-08-16 LAB — I-STAT CG4 LACTIC ACID, ED: LACTIC ACID, VENOUS: 1.45 mmol/L (ref 0.5–1.9)

## 2017-08-16 LAB — I-STAT TROPONIN, ED: Troponin i, poc: 0 ng/mL (ref 0.00–0.08)

## 2017-08-16 MED ORDER — DOXYCYCLINE HYCLATE 100 MG PO CAPS: 100.0000 mg | ORAL_CAPSULE | Freq: Two times a day (BID) | ORAL | 0 refills | Status: AC

## 2017-08-16 MED ORDER — LIDOCAINE 5 % EX PTCH
1.0000 | MEDICATED_PATCH | CUTANEOUS | Status: DC
  Administered 2017-08-16: 1 via TRANSDERMAL
  Filled 2017-08-16: qty 1

## 2017-08-16 MED ORDER — POTASSIUM CHLORIDE CRYS ER 20 MEQ PO TBCR
40.0000 meq | EXTENDED_RELEASE_TABLET | Freq: Once | ORAL | Status: DC
  Filled 2017-08-16: qty 2

## 2017-08-16 MED ORDER — ACETAMINOPHEN 500 MG PO TABS
1000.0000 mg | ORAL_TABLET | Freq: Once | ORAL | Status: AC
  Administered 2017-08-16: 1000 mg via ORAL
  Filled 2017-08-16: qty 2

## 2017-08-16 NOTE — ED Notes (Signed)
[facility] called for transport back to facility 

## 2017-08-16 NOTE — ED Triage Notes (Signed)
EMS reports pt did not fall but has left flank/rib pain, productive cough, swallows secretions. [facility]

## 2017-08-16 NOTE — Discharge Instructions (Signed)
Isabella [name]'s chest x-ray, spine x-rays, and lab work was very reassuring.  Her pain seems to be located in her left lower chest wall and is likely musculoskeletal.  I recommend regular, scheduled Tylenol for the next 2-3 days, then as needed for pain.  Please try to sit her up and get her out of bed as often as possible.  While this may hurt her temporarily, it will help prevent stiffness.  I recommend:  Tylenol 1000 mg every 6 hours for 48 hours, then every 6 hours as needed for pain  Because of her cough, I've prescribed an antibiotic for bronchitis. Take this as prescribed.

## 2017-08-16 NOTE — ED Provider Notes (Signed)
[facility]-EMERGENCY DEPT Provider Note   CSN: [id number] Arrival date & time: 08/16/17  [id number]     History   Chief Complaint Chief Complaint  Patient presents with  . Flank Pain    HPI Isabella SevinSarah [name] is a 83 y.o. female.  HPI  83 year old female with history of vascular dementia here with left flank pain.  According to the facility, the patient has had a mild, productive cough for 2 days.  She began to complain of left sided chest pain when she was sitting upright today.  It only occurs when she is trying to move out of bed.  Remainder of history is limited due to severe dementia.  Facility does not recall any falls.  She has not had any hemoptysis.  She has otherwise been at her baseline state of health.  No recent medication changes.  Level 5 caveat invoked as remainder of history, ROS, and physical exam limited due to patient's dementia.   Past Medical History:  Diagnosis Date  . Dementia   . Hearing loss   . Hypertension   . Seizures (HCC)   . Vascular dementia   . Weight loss     Patient Active Problem List   Diagnosis Date Noted  . CAP (community acquired pneumonia) 09/29/2016  . Post-ictal state (HCC)   . Altered mental status 09/27/2016  . HTN (hypertension) 09/27/2016  . Seizures (HCC) 09/27/2016  . Dementia 04/21/2015    History reviewed. No pertinent surgical history.  OB History    No data available       Home Medications    Prior to Admission medications   Medication Sig Start Date End Date Taking? Authorizing Provider  acetaminophen (TYLENOL) 500 MG tablet Take 1,000 mg 2 (two) times daily by mouth.   Yes [provider]  aspirin 81 MG chewable tablet Chew 81 mg by mouth daily.   Yes [provider]  chlorthalidone (HYGROTON) 25 MG tablet Take 12.5 mg by mouth every morning.   Yes [provider]  Cholecalciferol (VITAMIN D) 2000 units tablet Take 2,000 Units by mouth daily.   Yes [provider]  docusate sodium (COLACE) 100 MG capsule Take 100 mg by mouth daily.    Yes [provider]  levETIRAcetam (KEPPRA) 250 MG tablet Take 250 mg every evening by mouth.   Yes [provider]  levETIRAcetam (KEPPRA) 500 MG tablet Take 1 tablet (500 mg total) by mouth 2 (two) times daily. Patient taking differently: Take 500 mg every morning by mouth.  09/28/16  Yes Alm Bustard'Sullivan, Matthew, MD  LORazepam (ATIVAN) 0.5 MG tablet Take 0.5 mg by mouth 2 (two) times daily. Also takes 0.5 mg as needed for agitation.   Yes [provider]  Melatonin 3 MG CAPS Take 3 mg by mouth at bedtime.   Yes [provider]  mirtazapine (REMERON) 15 MG tablet Take 7.5 mg by mouth at bedtime.   Yes [provider]  Multiple Vitamin (MULTIVITAMIN) tablet Take 1 tablet by mouth daily.   Yes [provider]  Nutritional Supplements (NUTRITIONAL DRINK PO) Take 237 mLs daily by mouth. Medpass   Yes [provider]  Polyethyl Glycol-Propyl Glycol (SYSTANE) 0.4-0.3 % SOLN Place 1 drop into both eyes 2 (two) times daily.   Yes [provider]  potassium chloride SA (K-DUR,KLOR-CON) 20 MEQ tablet Take 20 mEq by mouth daily.   Yes [provider]  risperiDONE (RISPERDAL) 0.5 MG tablet Take 0.5 mg by mouth  at bedtime.   Yes [provider]  rivastigmine (EXELON) 9.5 mg/24hr Place 9.5 mg onto the skin daily.   Yes [provider]  traMADol (ULTRAM) 50 MG tablet Take 50 mg 2 (two) times daily by mouth.   Yes [provider]  doxycycline (VIBRAMYCIN) 100 MG capsule Take 1 capsule (100 mg total) 2 (two) times daily for 7 days by mouth. 08/16/17 08/23/17  Shaune PollackIsaacs, Roselene Gray, MD    Family History Family History  Family history unknown: Yes    Social History Social History   Tobacco Use  . Smoking status: Never Smoker  . Smokeless tobacco: Never Used  Substance Use Topics  . Alcohol use: No  . Drug use: No      Allergies   Patient has no known allergies.   Review of Systems Review of Systems  Unable to perform ROS: Dementia     Physical Exam Updated Vital Signs BP (!) 160/70 (BP Location: Left Arm)   Pulse 66   Temp (!) 97.4 F (36.3 C) (Oral)   Resp 14   SpO2 95%   Physical Exam  Constitutional: She appears well-developed and well-nourished. No distress.  Elderly female, smiling in no distress  HENT:  Head: Normocephalic and atraumatic.  Eyes: Conjunctivae are normal.  Neck: Neck supple.  Cardiovascular: Normal rate, regular rhythm and normal heart sounds. Exam reveals no friction rub.  No murmur heard. Pulmonary/Chest: Effort normal and breath sounds normal. No respiratory distress. She has no wheezes. She has no rales.  Significant pinpoint tenderness over left lower lateral chest wall.  This reproduces the patient's pain.  Abdominal: Soft. Bowel sounds are normal. She exhibits no distension.  Musculoskeletal: She exhibits no edema.  Neurological: She is alert. She exhibits normal muscle tone.  Alert, disoriented to person, place, and time.  Noted to move all extremities with at least antigravity strength.  Very hard of hearing.  Face is symmetric.  Skin: Skin is warm. Capillary refill takes less than 2 seconds.  Psychiatric: She has a normal mood and affect.  Nursing note and vitals reviewed.    ED Treatments / Results  Labs (all labs ordered are listed, but only abnormal results are displayed) Labs Reviewed  CBC WITH DIFFERENTIAL/PLATELET - Abnormal; Notable for the following components:      Result Value   RBC 3.81 (*)    Hemoglobin 11.9 (*)    All other components within normal limits  COMPREHENSIVE METABOLIC PANEL - Abnormal; Notable for the following components:   Potassium 3.3 (*)    BUN 21 (*)    Albumin 3.4 (*)    All other components within normal limits  URINALYSIS, ROUTINE W REFLEX MICROSCOPIC  I-STAT CG4 LACTIC ACID, ED  I-STAT TROPONIN, ED   I-STAT CG4 LACTIC ACID, ED    EKG  EKG Interpretation  Date/Time:  Saturday August 16 2017 11:17:11 EST Ventricular Rate:  76 PR Interval:    QRS Duration: 104 QT Interval:  428 QTC Calculation: 482 R Axis:   -25 Text Interpretation:  Sinus rhythm Borderline left axis deviation Low voltage, precordial leads Abnormal R-wave progression, early transition No significant change since last tracing Confirmed by Shaune PollackIsaacs, Roizy Harold [phone]([id number]) on 08/16/2017 1:23:46 PM       Radiology Dg Chest 2 View  Result Date: 08/16/2017 CLINICAL DATA:  Productive cough. EXAM: CHEST  2 VIEW COMPARISON:  03/26/2017 FINDINGS: Mild elevation of the right hemidiaphragm, stable. Heart is borderline in size. No confluent airspace opacities or effusions. No acute  bony abnormality. IMPRESSION: Stable elevation of the right hemidiaphragm.  No active disease. Electronically Signed   By: Charlett Nose M.D.   On: 08/16/2017 10:26   Dg Thoracic Spine 2 View  Result Date: 08/16/2017 CLINICAL DATA:  Left flank and rib pain.  Cough. EXAM: THORACIC SPINE 2 VIEWS COMPARISON:  Chest x-ray earlier today FINDINGS: Degenerative disc disease in the lower thoracic spine with disc space narrowing and spurring. Normal alignment. No fracture. IMPRESSION: No acute bony abnormality. Electronically Signed   By: Charlett Nose M.D.   On: 08/16/2017 12:30   Dg Lumbar Spine Complete  Result Date: 08/16/2017 CLINICAL DATA:  Left flank and rib pain.  No trauma. EXAM: LUMBAR SPINE - COMPLETE 4+ VIEW COMPARISON:  None. FINDINGS: Six lumbar type vertebral bodies. The last well-formed disc space is designated as L5-S1 for the purposes of this report. Mild dextroscoliosis, apex at L2. Mild chronic left-sided wedging of L2 and right-sided wedging of L4. Moderate to severe disc height loss, endplate spurring, and facet arthropathy from L1-L2 through L4-L5. The sacroiliac joints appear intact. Diffuse osteopenia. IMPRESSION: Moderate to severe  degenerative changes of the lumbar spine. No acute osseous abnormality. Electronically Signed   By: Obie Dredge M.D.   On: 08/16/2017 12:34    Procedures Procedures (including critical care time)  Medications Ordered in ED Medications  lidocaine (LIDODERM) 5 % 1 patch (1 patch Transdermal Patch Applied 08/16/17 1203)  potassium chloride SA (K-DUR,KLOR-CON) CR tablet 40 mEq (not administered)  acetaminophen (TYLENOL) tablet 1,000 mg (1,000 mg Oral Given 08/16/17 1202)     Initial Impression / Assessment and Plan / ED Course  I have reviewed the triage vital signs and the nursing notes.  Pertinent labs & imaging results that were available during my care of the patient were reviewed by me and considered in my medical decision making (see chart for details).     83 year old female with history of dementia here for evaluation of left chest wall pain.  No known recent falls.  On exam, she has pinpoint tenderness over the left lateral chest wall with no bruising.  She is otherwise at her baseline mental status and state of health.  She is satting well on room air.  Heart rate is less than 100 in the room, though when she is agitated, she does have low tachycardia.  Given her dementia, broad lab work and imaging obtained.  Troponin is negative and EKG is nonischemic.  I do not suspect ACS.  She is not hypoxic, tachycardic, has no lower extremity swelling, I do not suspect PE.  Her chest x-ray is clear.  Her CBC and labs are reassuring.  She does reportedly have increased sputum production and cough for several days, and I suspect her pain is secondary to muscular skeletal chest wall pain in the setting of coughing.  Given her sputum production, age, and comorbidities, will cover for possible early atypical bronchitis or pneumonia, discharged with outpatient follow-up.  I have asked the facility to schedule Tylenol for her pain and encourage movement as tolerated.   This note was prepared with  assistance of Conservation officer, historic buildings. Occasional wrong-word or sound-a-like substitutions may have occurred due to the inherent limitations of voice recognition software.  Final Clinical Impressions(s) / ED Diagnoses   Final diagnoses:  Left-sided chest wall pain    ED Discharge Orders        Ordered    doxycycline (VIBRAMYCIN) 100 MG capsule  2 times daily  08/16/17 1319       Shaune Pollack, MD 08/16/17 1525

## 2017-11-02 ENCOUNTER — Emergency Department (HOSPITAL_COMMUNITY): Payer: Medicare Other

## 2017-11-02 ENCOUNTER — Encounter (HOSPITAL_COMMUNITY): Payer: Self-pay

## 2017-11-02 ENCOUNTER — Inpatient Hospital Stay (HOSPITAL_COMMUNITY)
Admission: EM | Admit: 2017-11-02 | Discharge: 2017-11-08 | DRG: 200 | Disposition: A | Discharge status: Skilled Nursing Facility | Payer: Medicare Other | Attending: Internal Medicine | Admitting: Internal Medicine

## 2017-11-02 DIAGNOSIS — N182 Chronic kidney disease, stage 2 (mild): Secondary | ICD-10-CM | POA: Diagnosis present

## 2017-11-02 DIAGNOSIS — Z681 Body mass index (BMI) 19 or less, adult: Secondary | ICD-10-CM | POA: Diagnosis not present

## 2017-11-02 DIAGNOSIS — E440 Moderate protein-calorie malnutrition: Secondary | ICD-10-CM | POA: Diagnosis present

## 2017-11-02 DIAGNOSIS — I129 Hypertensive chronic kidney disease with stage 1 through stage 4 chronic kidney disease, or unspecified chronic kidney disease: Secondary | ICD-10-CM | POA: Diagnosis present

## 2017-11-02 DIAGNOSIS — E870 Hyperosmolality and hypernatremia: Secondary | ICD-10-CM | POA: Diagnosis present

## 2017-11-02 DIAGNOSIS — R1310 Dysphagia, unspecified: Secondary | ICD-10-CM | POA: Diagnosis present

## 2017-11-02 DIAGNOSIS — F0150 Vascular dementia without behavioral disturbance: Secondary | ICD-10-CM | POA: Diagnosis present

## 2017-11-02 DIAGNOSIS — Z7982 Long term (current) use of aspirin: Secondary | ICD-10-CM

## 2017-11-02 DIAGNOSIS — Z79899 Other long term (current) drug therapy: Secondary | ICD-10-CM | POA: Diagnosis not present

## 2017-11-02 DIAGNOSIS — H9190 Unspecified hearing loss, unspecified ear: Secondary | ICD-10-CM | POA: Diagnosis present

## 2017-11-02 DIAGNOSIS — R627 Adult failure to thrive: Secondary | ICD-10-CM | POA: Diagnosis present

## 2017-11-02 DIAGNOSIS — Z22322 Carrier or suspected carrier of Methicillin resistant Staphylococcus aureus: Secondary | ICD-10-CM | POA: Diagnosis not present

## 2017-11-02 DIAGNOSIS — J939 Pneumothorax, unspecified: Secondary | ICD-10-CM | POA: Diagnosis not present

## 2017-11-02 DIAGNOSIS — J9600 Acute respiratory failure, unspecified whether with hypoxia or hypercapnia: Secondary | ICD-10-CM | POA: Diagnosis not present

## 2017-11-02 DIAGNOSIS — Z515 Encounter for palliative care: Secondary | ICD-10-CM | POA: Diagnosis not present

## 2017-11-02 DIAGNOSIS — E876 Hypokalemia: Secondary | ICD-10-CM | POA: Diagnosis present

## 2017-11-02 DIAGNOSIS — E860 Dehydration: Secondary | ICD-10-CM | POA: Diagnosis present

## 2017-11-02 DIAGNOSIS — R569 Unspecified convulsions: Secondary | ICD-10-CM | POA: Diagnosis present

## 2017-11-02 DIAGNOSIS — R64 Cachexia: Secondary | ICD-10-CM | POA: Diagnosis present

## 2017-11-02 DIAGNOSIS — N179 Acute kidney failure, unspecified: Secondary | ICD-10-CM | POA: Diagnosis present

## 2017-11-02 DIAGNOSIS — Z4682 Encounter for fitting and adjustment of non-vascular catheter: Secondary | ICD-10-CM

## 2017-11-02 DIAGNOSIS — Z66 Do not resuscitate: Secondary | ICD-10-CM | POA: Diagnosis present

## 2017-11-02 DIAGNOSIS — J930 Spontaneous tension pneumothorax: Secondary | ICD-10-CM | POA: Diagnosis present

## 2017-11-02 DIAGNOSIS — R4182 Altered mental status, unspecified: Secondary | ICD-10-CM | POA: Diagnosis present

## 2017-11-02 DIAGNOSIS — J9601 Acute respiratory failure with hypoxia: Secondary | ICD-10-CM | POA: Diagnosis not present

## 2017-11-02 LAB — CBC WITH DIFFERENTIAL/PLATELET
BASOS ABS: 0 10*3/uL (ref 0.0–0.1)
Basophils Relative: 0 %
EOS PCT: 0 %
Eosinophils Absolute: 0 10*3/uL (ref 0.0–0.7)
HCT: 52.4 % — ABNORMAL HIGH (ref 36.0–46.0)
Hemoglobin: 16.7 g/dL — ABNORMAL HIGH (ref 12.0–15.0)
LYMPHS PCT: 14 %
Lymphs Abs: 0.9 10*3/uL (ref 0.7–4.0)
MCH: 31.9 pg (ref 26.0–34.0)
MCHC: 31.9 g/dL (ref 30.0–36.0)
MCV: 100.2 fL — AB (ref 78.0–100.0)
MONO ABS: 0.5 10*3/uL (ref 0.1–1.0)
Monocytes Relative: 8 %
Neutro Abs: 5.1 10*3/uL (ref 1.7–7.7)
Neutrophils Relative %: 78 %
PLATELETS: 235 10*3/uL (ref 150–400)
RBC: 5.23 MIL/uL — ABNORMAL HIGH (ref 3.87–5.11)
RDW: 14.5 % (ref 11.5–15.5)
WBC: 6.6 10*3/uL (ref 4.0–10.5)

## 2017-11-02 LAB — URINALYSIS, ROUTINE W REFLEX MICROSCOPIC
Bilirubin Urine: NEGATIVE
Glucose, UA: NEGATIVE mg/dL
Hgb urine dipstick: NEGATIVE
Ketones, ur: 5 mg/dL — AB
LEUKOCYTES UA: NEGATIVE
NITRITE: NEGATIVE
Protein, ur: NEGATIVE mg/dL
Specific Gravity, Urine: 1.024 (ref 1.005–1.030)
pH: 5 (ref 5.0–8.0)

## 2017-11-02 LAB — COMPREHENSIVE METABOLIC PANEL
ALT: 47 U/L (ref 14–54)
AST: 45 U/L — AB (ref 15–41)
Albumin: 4.3 g/dL (ref 3.5–5.0)
Alkaline Phosphatase: 93 U/L (ref 38–126)
Anion gap: 12 (ref 5–15)
BUN: 67 mg/dL — ABNORMAL HIGH (ref 6–20)
CHLORIDE: 127 mmol/L — AB (ref 101–111)
CO2: 25 mmol/L (ref 22–32)
Calcium: 10.1 mg/dL (ref 8.9–10.3)
Creatinine, Ser: 1.4 mg/dL — ABNORMAL HIGH (ref 0.44–1.00)
GFR, EST AFRICAN AMERICAN: 36 mL/min — AB (ref >60)
GFR, EST NON AFRICAN AMERICAN: 31 mL/min — AB (ref >60)
Glucose, Bld: 110 mg/dL — ABNORMAL HIGH (ref 65–99)
POTASSIUM: 3.5 mmol/L (ref 3.5–5.1)
Sodium: 164 mmol/L (ref 135–145)
Total Bilirubin: 1 mg/dL (ref 0.3–1.2)
Total Protein: 8.9 g/dL — ABNORMAL HIGH (ref 6.5–8.1)

## 2017-11-02 MED ORDER — LEVETIRACETAM 250 MG PO TABS
250.0000 mg | ORAL_TABLET | Freq: Every day | ORAL | Status: DC
  Administered 2017-11-02 – 2017-11-07 (×6): 250 mg via ORAL
  Filled 2017-11-02 (×6): qty 1

## 2017-11-02 MED ORDER — RISPERIDONE 0.5 MG PO TBDP
0.5000 mg | ORAL_TABLET | Freq: Every day | ORAL | Status: DC
  Administered 2017-11-02 – 2017-11-07 (×6): 0.5 mg via ORAL
  Filled 2017-11-02 (×2): qty 1
  Filled 2017-11-02: qty 0.5
  Filled 2017-11-02: qty 1
  Filled 2017-11-02: qty 0.5
  Filled 2017-11-02: qty 1
  Filled 2017-11-02 (×3): qty 0.5

## 2017-11-02 MED ORDER — LEVETIRACETAM 500 MG PO TABS
500.0000 mg | ORAL_TABLET | Freq: Every day | ORAL | Status: DC
  Administered 2017-11-03 – 2017-11-08 (×6): 500 mg via ORAL
  Filled 2017-11-02 (×6): qty 1

## 2017-11-02 MED ORDER — SODIUM CHLORIDE 0.9 % IV SOLN
INTRAVENOUS | Status: DC
  Administered 2017-11-02: 15:00:00 via INTRAVENOUS

## 2017-11-02 MED ORDER — FENTANYL CITRATE (PF) 100 MCG/2ML IJ SOLN
25.0000 ug | INTRAMUSCULAR | Status: DC | PRN
  Administered 2017-11-03: 25 ug via INTRAVENOUS
  Filled 2017-11-02: qty 2

## 2017-11-02 MED ORDER — POTASSIUM CHLORIDE 2 MEQ/ML IV SOLN
INTRAVENOUS | Status: DC
  Administered 2017-11-02 – 2017-11-05 (×5): via INTRAVENOUS
  Filled 2017-11-02 (×9): qty 1000

## 2017-11-02 MED ORDER — POTASSIUM CHLORIDE 2 MEQ/ML IV SOLN: INTRAVENOUS | Status: DC

## 2017-11-02 MED ORDER — MIRTAZAPINE 15 MG PO TBDP
7.5000 mg | ORAL_TABLET | Freq: Every day | ORAL | Status: DC
  Administered 2017-11-02 – 2017-11-07 (×6): 7.5 mg via ORAL
  Filled 2017-11-02 (×6): qty 0.5

## 2017-11-02 MED ORDER — LIDOCAINE HCL (PF) 1 % IJ SOLN
INTRAMUSCULAR | Status: AC
  Filled 2017-11-02: qty 30

## 2017-11-02 MED ORDER — FENTANYL CITRATE (PF) 100 MCG/2ML IJ SOLN
INTRAMUSCULAR | Status: AC
  Administered 2017-11-02: 25 ug via INTRAVENOUS
  Filled 2017-11-02: qty 2

## 2017-11-02 MED ORDER — FENTANYL CITRATE (PF) 100 MCG/2ML IJ SOLN
25.0000 ug | Freq: Once | INTRAMUSCULAR | Status: AC
  Administered 2017-11-02: 25 ug via INTRAVENOUS

## 2017-11-02 NOTE — ED Triage Notes (Signed)
Patient coming from nursing home [facility] with c/o alteral mental status. Pt have hx of dementia but she have not be acting like her self per nursing home staff.

## 2017-11-02 NOTE — ED Notes (Signed)
Patient transported to X-ray 

## 2017-11-02 NOTE — ED Notes (Signed)
Chest tube inserted

## 2017-11-02 NOTE — ED Provider Notes (Signed)
[facility]-EMERGENCY DEPT Provider Note   CSN: [id number] Arrival date & time: 11/02/17  1317     History   Chief Complaint No chief complaint on file.   HPI Isabella [name] is a 84 y.o. female.  84 year old female from nursing home with history of dementia here with 1 day of altered mental status according to EMS has had decreased oral intake.  No reported fever, vomiting.  History is limited due to her current state.  EMS called and patient transported here      Past Medical History:  Diagnosis Date  . Dementia   . Hearing loss   . Hypertension   . Seizures (HCC)   . Vascular dementia   . Weight loss     Patient Active Problem List   Diagnosis Date Noted  . CAP (community acquired pneumonia) 09/29/2016  . Post-ictal state (HCC)   . Altered mental status 09/27/2016  . HTN (hypertension) 09/27/2016  . Seizures (HCC) 09/27/2016  . Dementia 04/21/2015    No past surgical history on file.  OB History    No data available       Home Medications    Prior to Admission medications   Medication Sig Start Date End Date Taking? Authorizing Provider  acetaminophen (TYLENOL) 500 MG tablet Take 1,000 mg 2 (two) times daily by mouth.   Yes [provider]  acetaminophen (TYLENOL) 500 MG tablet Take 1,000 mg by mouth every 6 (six) hours as needed for mild pain.   Yes [provider]  aspirin 81 MG chewable tablet Chew 81 mg by mouth daily.   Yes [provider]  chlorthalidone (HYGROTON) 25 MG tablet Take 12.5 mg by mouth every morning.   Yes [provider]  Cholecalciferol (VITAMIN D) 2000 units tablet Take 2,000 Units by mouth daily.   Yes [provider]  docusate sodium (COLACE) 100 MG capsule Take 100 mg by mouth daily.    Yes [provider]  levETIRAcetam (KEPPRA) 250 MG tablet Take 250 mg every evening by mouth.   Yes [provider]  levETIRAcetam (KEPPRA) 500 MG tablet Take 1  tablet (500 mg total) by mouth 2 (two) times daily. Patient taking differently: Take 500 mg every morning by mouth.  09/28/16  Yes Alm Bustard, MD  LORazepam (ATIVAN) 0.5 MG tablet Take 0.5 mg by mouth 2 (two) times daily. Also takes 0.5 mg as needed for agitation.   Yes [provider]  LORazepam (ATIVAN) 0.5 MG tablet Take 0.25 mg by mouth daily as needed (agitation).   Yes [provider]  Melatonin 3 MG CAPS Take 3 mg by mouth at bedtime.   Yes [provider]  mirtazapine (REMERON) 15 MG tablet Take 7.5 mg by mouth at bedtime.   Yes [provider]  Nutritional Supplements (NUTRITIONAL DRINK PO) Take 237 mLs daily by mouth. Medpass   Yes [provider]  Polyethyl Glycol-Propyl Glycol (SYSTANE) 0.4-0.3 % SOLN Place 1 drop into both eyes 2 (two) times daily.   Yes [provider]  potassium chloride SA (K-DUR,KLOR-CON) 20 MEQ tablet Take 20 mEq by mouth daily.   Yes [provider]  risperiDONE (RISPERDAL) 0.5 MG tablet Take 0.5 mg by mouth at bedtime.   Yes [provider]  traMADol (ULTRAM) 50 MG tablet Take 50 mg 2 (two) times daily by mouth.   Yes [provider]    Family History Family History  Family history unknown: Yes  Social History Social History   Tobacco Use  . Smoking status: Never Smoker  . Smokeless tobacco: Never Used  Substance Use Topics  . Alcohol use: No  . Drug use: No     Allergies   Patient has no known allergies.   Review of Systems Review of Systems  Unable to perform ROS: Dementia     Physical Exam Updated Vital Signs BP (!) 139/106 (BP Location: Left Arm)   Pulse (!) 103   Temp 97.6 F (36.4 C) (Oral)   Resp 19   SpO2 96%   Physical Exam  Constitutional: She appears well-developed and well-nourished.  Non-toxic appearance. No distress.  HENT:  Head: Normocephalic and atraumatic.  Eyes: Conjunctivae, EOM and lids are normal. Pupils are equal,  round, and reactive to light.  Neck: Normal range of motion. Neck supple. No tracheal deviation present. No thyroid mass present.  Cardiovascular: Normal rate, regular rhythm and normal heart sounds. Exam reveals no gallop.  No murmur heard. Pulmonary/Chest: Effort normal and breath sounds normal. No stridor. No respiratory distress. She has no decreased breath sounds. She has no wheezes. She has no rhonchi. She has no rales.  Abdominal: Soft. Normal appearance and bowel sounds are normal. She exhibits no distension. There is no tenderness. There is no rebound and no CVA tenderness.  Musculoskeletal: Normal range of motion. She exhibits no edema or tenderness.  Neurological: She is alert. She is disoriented. She displays atrophy. No cranial nerve deficit. GCS eye subscore is 4. GCS verbal subscore is 4. GCS motor subscore is 5.  Skin: Skin is warm and dry. No abrasion and no rash noted.  Psychiatric: She is inattentive.  Nursing note and vitals reviewed.    ED Treatments / Results  Labs (all labs ordered are listed, but only abnormal results are displayed) Labs Reviewed  URINE CULTURE  CBC WITH DIFFERENTIAL/PLATELET  COMPREHENSIVE METABOLIC PANEL  URINALYSIS, ROUTINE W REFLEX MICROSCOPIC    EKG  EKG Interpretation None       Radiology No results found.  Procedures Procedures (including critical care time)  Medications Ordered in ED Medications  0.9 %  sodium chloride infusion (not administered)     Initial Impression / Assessment and Plan / ED Course  I have reviewed the triage vital signs and the nursing notes.  Pertinent labs & imaging results that were available during my care of the patient were reviewed by me and considered in my medical decision making (see chart for details).    Chest x-ray consistent with large right-sided pneumothorax.  Patient tolerating this very well.  She has no signs of extremis.  Head CT without acute findings.  Discussed with Dr. Sherene SiresWert  from pulmonary who will come and admit the patient and place a chest tube.  Final Clinical Impressions(s) / ED Diagnoses   Final diagnoses:  None    ED Discharge Orders    None       Lorre NickAllen, Kawanna Christley, MD 11/02/17 [phone]

## 2017-11-02 NOTE — Procedures (Signed)
Right Chest tube placement    Indication:  Large R PTX   Sterile prep/ drape  Fentanyl 50 mcg given   1% xylocaine used at around C5-6 space and #24 fr chest tube placed to 8 cm and sutured in place with immediate air rush and continued bubbling on water seal  Placed to 20 cm suction   F/u cxr shows ok position though tube is headed laterally instead of superiorly , I believe it's anterior the RML and RLL    Discussed with daughter at bedside   Sandrea HughsMichael Annalysa Mohammad, MD Pulmonary and Critical Care Medicine [facility] Cell [phone] After 5:30 PM or weekends, use Beeper [phone]

## 2017-11-02 NOTE — H&P (Signed)
PULMONARY / CRITICAL CARE MEDICINE   Name: Isabella [name] MRN: [id number] DOB: 1921/08/28    ADMISSION DATE:  11/02/2017 CONSULTATION DATE:  11/02/17   REFERRING MD:  Dr Lothrop/ [facility]  CHIEF COMPLAINT:  AMS/ dyspnea   HISTORY OF PRESENT ILLNESS:   39 yobf NH resident, dnr status with severe dementia referred to er pm 2/3 wit FTT with poor po intake, worsening AMS x several days with initial w/u c/w large R PTX and severe hypernatemia and PCCM service asked to admit.  Daughter last saw mother 3 weeks pta and was able to recognize her and converse, feed herself but very inactive otherwise and there was no h/o R cp or cough that her daughter was aware of nor chest trauma  Of any kind.     PAST MEDICAL HISTORY :  She  has a past medical history of Dementia, Hearing loss, Hypertension, Seizures (HCC), Vascular dementia, and Weight loss.  PAST SURGICAL HISTORY: She  has no past surgical history on file.  No Known Allergies  No current facility-administered medications on file prior to encounter.    Current Outpatient Medications on File Prior to Encounter  Medication Sig  . acetaminophen (TYLENOL) 500 MG tablet Take 1,000 mg 2 (two) times daily by mouth.  Marland Kitchen acetaminophen (TYLENOL) 500 MG tablet Take 1,000 mg by mouth every 6 (six) hours as needed for mild pain.  Marland Kitchen aspirin 81 MG chewable tablet Chew 81 mg by mouth daily.  . chlorthalidone (HYGROTON) 25 MG tablet Take 12.5 mg by mouth every morning.  . Cholecalciferol (VITAMIN D) 2000 units tablet Take 2,000 Units by mouth daily.  Marland Kitchen docusate sodium (COLACE) 100 MG capsule Take 100 mg by mouth daily.   Marland Kitchen levETIRAcetam (KEPPRA) 250 MG tablet Take 250 mg every evening by mouth.  . levETIRAcetam (KEPPRA) 500 MG tablet Take 1 tablet (500 mg total) by mouth 2 (two) times daily. (Patient taking differently: Take 500 mg every morning by mouth. )  . LORazepam (ATIVAN) 0.5 MG tablet Take 0.5 mg by mouth 2 (two) times daily. Also takes 0.5 mg as needed  for agitation.  Marland Kitchen LORazepam (ATIVAN) 0.5 MG tablet Take 0.25 mg by mouth daily as needed (agitation).  . Melatonin 3 MG CAPS Take 3 mg by mouth at bedtime.  . mirtazapine (REMERON) 15 MG tablet Take 7.5 mg by mouth at bedtime.  . Nutritional Supplements (NUTRITIONAL DRINK PO) Take 237 mLs daily by mouth. Medpass  . Polyethyl Glycol-Propyl Glycol (SYSTANE) 0.4-0.3 % SOLN Place 1 drop into both eyes 2 (two) times daily.  . potassium chloride SA (K-DUR,KLOR-CON) 20 MEQ tablet Take 20 mEq by mouth daily.  . risperiDONE (RISPERDAL) 0.5 MG tablet Take 0.5 mg by mouth at bedtime.  . traMADol (ULTRAM) 50 MG tablet Take 50 mg 2 (two) times daily by mouth.    FAMILY HISTORY:  Her has no family status information on file.    SOCIAL HISTORY: She  reports that  has never smoked. she has never used smokeless tobacco. She reports that she does not drink alcohol or use drugs.  REVIEW OF SYSTEMS:   Not available   SUBJECTIVE:  Not verbalizing an symptoms.   VITAL SIGNS: BP 123/88   Pulse 100   Temp 97.6 F (36.4 C) (Oral)   Resp 18   SpO2 98%   HEMODYNAMICS:    VENTILATOR SETTINGS:    INTAKE / OUTPUT: No intake/output data recorded.  PHYSICAL EXAMINATION: General:    Cachectic elderly bf with  sunken facies/ Appears comfortable lying if fetal postion with L side down     No jvd Oropharynx clear/ mucosa dry / edentulous Neck supple Lungs hyper resonance on R and absent bs RRR no s3 or or sign murmur Abd soft Ext  warm with no edema or clubbing noted/ muscle wasting diffusely Neuro  Non verbal/ not following simple command but moves all 4 ext spontaneously   LABS:  BMET Recent Labs  Lab 11/02/17 1439  NA 164*  K 3.5  CL 127*  CO2 25  BUN 67*  CREATININE 1.40*  GLUCOSE 110*    Electrolytes Recent Labs  Lab 11/02/17 1439  CALCIUM 10.1    CBC Recent Labs  Lab 11/02/17 1439  WBC 6.6  HGB 16.7*  HCT 52.4*  PLT 235    Coag's No results for input(s): APTT,  INR in the last 168 hours.  Sepsis Markers No results for input(s): LATICACIDVEN, PROCALCITON, O2SATVEN in the last 168 hours.  ABG No results for input(s): PHART, PCO2ART, PO2ART in the last 168 hours.  Liver Enzymes Recent Labs  Lab 11/02/17 1439  AST 45*  ALT 47  ALKPHOS 93  BILITOT 1.0  ALBUMIN 4.3    Cardiac Enzymes No results for input(s): TROPONINI, PROBNP in the last 168 hours.  Glucose No results for input(s): GLUCAP in the last 168 hours.  Imaging Dg Chest 2 View  Result Date: 11/02/2017 CLINICAL DATA:  Shortness of breath EXAM: CHEST  2 VIEW COMPARISON:  08/16/2017 FINDINGS: There is a large right pneumothorax measuring greater than 90%. There is leftward shift of the trachea concerning for a tension pneumothorax. The left lung is clear. There is no left pneumothorax. There is no pleural effusion. The heart mediastinum are stable. The osseous structures are unremarkable. IMPRESSION: Large right pneumothorax measuring greater than 90% with leftward shift of the trachea concerning for a tension pneumothorax. Critical Value/emergent results were called by telephone at the time of interpretation on 11/02/2017 at 2:12 pm to Dr. Lorre NickANTHONY Usery , who verbally acknowledged these results. Electronically Signed   By: Elige KoHetal  Patel   On: 11/02/2017 14:14   Ct Head Wo Contrast  Result Date: 11/02/2017 CLINICAL DATA:  Altered mental status. EXAM: CT HEAD WITHOUT CONTRAST TECHNIQUE: Contiguous axial images were obtained from the base of the skull through the vertex without intravenous contrast. COMPARISON:  03/10/2017. FINDINGS: Brain: Diffusely enlarged ventricles and subarachnoid spaces. Patchy white matter low density in both cerebral hemispheres. No intracranial hemorrhage, mass lesion or CT evidence of acute infarction. Vascular: No hyperdense vessel or unexpected calcification. Skull: Normal. Negative for fracture or focal lesion. Sinuses/Orbits: Status post bilateral cataract  extraction. Unremarkable paranasal sinuses. Chronic bilateral mastoid air cell opacification and underdevelopment. Other: None. IMPRESSION: 1. No acute abnormality. 2. Stable cerebral and cerebellar atrophy and chronic small vessel white matter ischemic changes. Electronically Signed   By: Beckie SaltsSteven  Reid M.D.   On: 11/02/2017 14:33        SIGNIFICANT EVENTS:    LINES/TUBES: R chest tube 11/02/17 >>>     ASSESSMENT / PLAN:    1) Tension R PTX ? Spontaneous  - R Chest tube placed pm 2/3 to 20 cm suction    2) Severe Hypernatremia likely chronic - correct gradually with hypotonic fluids with KCL  3) Severe underlying dementia/ non-verbal  - DNR status appropriate    Family: Daughter at bedside, confirms dnr status, ok for floor admit.     Sandrea HughsMichael Harlem Bula, MD Pulmonary and Critical Care  [facility] Cell [phone] After 5:30 PM or weekends, use Beeper [phone]

## 2017-11-02 NOTE — ED Notes (Signed)
ED TO INPATIENT HANDOFF REPORT  Name/Age/Gender Isabella [name] 84 y.o. female  Code Status    Code Status Orders  (From admission, onward)        Start     Ordered   11/02/17 1538  Do not attempt resuscitation (DNR)  Continuous    Question Answer Comment  Maintain current active treatments Yes   Do not initiate new interventions Yes      11/02/17 1543    Code Status History    Date Active Date Inactive Code Status Order ID Comments User Context   09/30/2016 10:39 09/30/2016 16:07 DNR [id number]  Minus Liberty, MD Inpatient   09/30/2016 02:45 09/30/2016 10:39 Full Code [id number]  Zada Finders, MD Inpatient   09/27/2016 21:12 09/28/2016 17:27 Full Code [id number]  Jule Ser, DO Inpatient      Home/SNF/Other Nursing Home  Chief Complaint Altered Mental Status  Level of Care/Admitting Diagnosis ED Disposition    ED Disposition Condition [facility] Area: [facility] [[id number]]  Level of Care: Med-Surg [16]  Diagnosis: Pneumothorax on right [767341]  Admitting Physician: Wilmon Pali  Attending Physician: Tanda Rockers [[id number]]  Estimated length of stay: 3 - 4 days  Certification:: I certify this patient will need inpatient services for at least 2 midnights  PT Class (Do Not Modify): Inpatient [101]  PT Acc Code (Do Not Modify): Private [1]       Medical History Past Medical History:  Diagnosis Date  . Dementia   . Hearing loss   . Hypertension   . Seizures ([place])   . Vascular dementia   . Weight loss     Allergies No Known Allergies  IV Location/Drains/Wounds Patient Lines/Drains/Airways Status   Active Line/Drains/Airways    Name:   Placement date:   Placement time:   Site:   Days:   Chest Tube 1 Right Pleural 24 Fr.   11/02/17    1519    Pleural   less than 1          Labs/Imaging Results for orders placed or performed during the hospital encounter of 11/02/17 (from the past 48 hour(s))  CBC with  Differential/Platelet     Status: Abnormal   Collection Time: 11/02/17  2:39 PM  Result Value Ref Range   WBC 6.6 4.0 - 10.5 K/uL   RBC 5.23 (H) 3.87 - 5.11 MIL/uL   Hemoglobin 16.7 (H) 12.0 - 15.0 g/dL   HCT 52.4 (H) 36.0 - 46.0 %   MCV 100.2 (H) 78.0 - 100.0 fL   MCH 31.9 26.0 - 34.0 pg   MCHC 31.9 30.0 - 36.0 g/dL   RDW 14.5 11.5 - 15.5 %   Platelets 235 150 - 400 K/uL   Neutrophils Relative % 78 %   Neutro Abs 5.1 1.7 - 7.7 K/uL   Lymphocytes Relative 14 %   Lymphs Abs 0.9 0.7 - 4.0 K/uL   Monocytes Relative 8 %   Monocytes Absolute 0.5 0.1 - 1.0 K/uL   Eosinophils Relative 0 %   Eosinophils Absolute 0.0 0.0 - 0.7 K/uL   Basophils Relative 0 %   Basophils Absolute 0.0 0.0 - 0.1 K/uL    Comment: Performed at [facility], [place] [address] [place], [place] [address]  Comprehensive metabolic panel     Status: Abnormal   Collection Time: 11/02/17  2:39 PM  Result Value Ref Range   Sodium 164 (HH) 135 - 145 mmol/L  Comment: CRITICAL RESULT CALLED TO, READ BACK BY AND VERIFIED WITH: K.ZULTEA,RN 11/02/17 '@1531'$  BY V.WILKINS    Potassium 3.5 3.5 - 5.1 mmol/L   Chloride 127 (H) 101 - 111 mmol/L   CO2 25 22 - 32 mmol/L   Glucose, Bld 110 (H) 65 - 99 mg/dL   BUN 67 (H) 6 - 20 mg/dL   Creatinine, Ser 1.40 (H) 0.44 - 1.00 mg/dL   Calcium 10.1 8.9 - 10.3 mg/dL   Total Protein 8.9 (H) 6.5 - 8.1 g/dL   Albumin 4.3 3.5 - 5.0 g/dL   AST 45 (H) 15 - 41 U/L   ALT 47 14 - 54 U/L   Alkaline Phosphatase 93 38 - 126 U/L   Total Bilirubin 1.0 0.3 - 1.2 mg/dL   GFR calc non Af Amer 31 (L) >60 mL/min   GFR calc Af Amer 36 (L) >60 mL/min    Comment: (NOTE) The eGFR has been calculated using the CKD EPI equation. This calculation has not been validated in all clinical situations. eGFR's persistently <60 mL/min signify possible Chronic Kidney Disease.    Anion gap 12 5 - 15    Comment: Performed at [facility], [place] [address] [place], [place]  [address]  Urinalysis, Routine w reflex microscopic     Status: Abnormal   Collection Time: 11/02/17  3:00 PM  Result Value Ref Range   Color, Urine AMBER (A) YELLOW    Comment: BIOCHEMICALS MAY BE AFFECTED BY COLOR   APPearance CLEAR CLEAR   Specific Gravity, Urine 1.024 1.005 - 1.030   pH 5.0 5.0 - 8.0   Glucose, UA NEGATIVE NEGATIVE mg/dL   Hgb urine dipstick NEGATIVE NEGATIVE   Bilirubin Urine NEGATIVE NEGATIVE   Ketones, ur 5 (A) NEGATIVE mg/dL   Protein, ur NEGATIVE NEGATIVE mg/dL   Nitrite NEGATIVE NEGATIVE   Leukocytes, UA NEGATIVE NEGATIVE    Comment: Performed at [place] [address], [place], [place] [address]   Dg Chest 2 View  Result Date: 11/02/2017 CLINICAL DATA:  Shortness of breath EXAM: CHEST  2 VIEW COMPARISON:  08/16/2017 FINDINGS: There is a large right pneumothorax measuring greater than 90%. There is leftward shift of the trachea concerning for a tension pneumothorax. The left lung is clear. There is no left pneumothorax. There is no pleural effusion. The heart mediastinum are stable. The osseous structures are unremarkable. IMPRESSION: Large right pneumothorax measuring greater than 90% with leftward shift of the trachea concerning for a tension pneumothorax. Critical Value/emergent results were called by telephone at the time of interpretation on 11/02/2017 at 2:12 pm to Dr. Lacretia Leigh , who verbally acknowledged these results. Electronically Signed   By: Kathreen Devoid   On: 11/02/2017 14:14   Ct Head Wo Contrast  Result Date: 11/02/2017 CLINICAL DATA:  Altered mental status. EXAM: CT HEAD WITHOUT CONTRAST TECHNIQUE: Contiguous axial images were obtained from the base of the skull through the vertex without intravenous contrast. COMPARISON:  03/10/2017. FINDINGS: Brain: Diffusely enlarged ventricles and subarachnoid spaces. Patchy white matter low density in both cerebral hemispheres. No intracranial hemorrhage, mass lesion or CT evidence of acute  infarction. Vascular: No hyperdense vessel or unexpected calcification. Skull: Normal. Negative for fracture or focal lesion. Sinuses/Orbits: Status post bilateral cataract extraction. Unremarkable paranasal sinuses. Chronic bilateral mastoid air cell opacification and underdevelopment. Other: None. IMPRESSION: 1. No acute abnormality. 2. Stable cerebral and cerebellar atrophy and chronic small vessel white matter ischemic changes. Electronically Signed   By:  Claudie Revering M.D.   On: 11/02/2017 14:33   Dg Chest Port 1 View  Result Date: 11/02/2017 CLINICAL DATA:  Right pneumothorax status post chest tube insertion. EXAM: PORTABLE CHEST 1 VIEW COMPARISON:  11/02/2017 FINDINGS: Right-sided chest tube has been placed, with significant decrease in right pneumothorax. Small right apical pneumothorax persists, measuring 12 millimeters at the apex. There is persistent elevation of the right hemidiaphragm. Lungs are clear. There is a bone island within the right posterior 5th rib. IMPRESSION: Smaller right pneumothorax following placement of right-sided chest tube. Electronically Signed   By: Nolon Nations M.D.   On: 11/02/2017 15:54    Pending Labs Unresulted Labs (From admission, onward)   Start     Ordered   11/03/17 0500  CBC  Tomorrow morning,   STAT     11/02/17 1543   11/03/17 5183  Basic metabolic panel  Tomorrow morning,   STAT     11/02/17 1543   11/02/17 1340  Urine Culture  STAT,   STAT     11/02/17 1339      Vitals/Pain Today's Vitals   11/02/17 1545 11/02/17 1600 11/02/17 1630 11/02/17 1700  BP:  121/89 103/84 106/72  Pulse:   85 85  Resp:  '19 18 16  '$ Temp:      TempSrc:      SpO2: 96%  100% 100%    Isolation Precautions No active isolations  Medications Medications  lidocaine (PF) (XYLOCAINE) 1 % injection (not administered)  dextrose 5 % 1,000 mL with potassium chloride 40 mEq/L Pediatric IV infusion (not administered)  levETIRAcetam (KEPPRA) tablet 500 mg (not  administered)  levETIRAcetam (KEPPRA) tablet 250 mg (not administered)  mirtazapine (REMERON SOL-TAB) disintegrating tablet 7.5 mg (not administered)  risperiDONE (RISPERDAL M-TABS) disintegrating tablet 0.5 mg (not administered)  fentaNYL (SUBLIMAZE) injection 25-50 mcg (not administered)  fentaNYL (SUBLIMAZE) injection 25 mcg (25 mcg Intravenous Given 11/02/17 1518)    Mobility walks

## 2017-11-02 NOTE — ED Notes (Signed)
Bed: [id number] Expected date:  Expected time:  Means of arrival:  Comments: 84 yo AMS

## 2017-11-03 ENCOUNTER — Other Ambulatory Visit: Payer: Self-pay

## 2017-11-03 ENCOUNTER — Inpatient Hospital Stay (HOSPITAL_COMMUNITY): Payer: Medicare Other

## 2017-11-03 ENCOUNTER — Encounter (HOSPITAL_COMMUNITY): Payer: Self-pay | Admitting: *Deleted

## 2017-11-03 LAB — CBC
HEMATOCRIT: 43.4 % (ref 36.0–46.0)
Hemoglobin: 13.5 g/dL (ref 12.0–15.0)
MCH: 30.9 pg (ref 26.0–34.0)
MCHC: 31.1 g/dL (ref 30.0–36.0)
MCV: 99.3 fL (ref 78.0–100.0)
Platelets: 169 10*3/uL (ref 150–400)
RBC: 4.37 MIL/uL (ref 3.87–5.11)
RDW: 14.6 % (ref 11.5–15.5)
WBC: 9.3 10*3/uL (ref 4.0–10.5)

## 2017-11-03 LAB — BASIC METABOLIC PANEL
Anion gap: 4 — ABNORMAL LOW (ref 5–15)
BUN: 47 mg/dL — ABNORMAL HIGH (ref 6–20)
CALCIUM: 8.9 mg/dL (ref 8.9–10.3)
CHLORIDE: 129 mmol/L — AB (ref 101–111)
CO2: 27 mmol/L (ref 22–32)
CREATININE: 1.07 mg/dL — AB (ref 0.44–1.00)
GFR calc Af Amer: 50 mL/min — ABNORMAL LOW (ref >60)
GFR calc non Af Amer: 43 mL/min — ABNORMAL LOW (ref >60)
GLUCOSE: 152 mg/dL — AB (ref 65–99)
Potassium: 3.6 mmol/L (ref 3.5–5.1)
Sodium: 160 mmol/L — ABNORMAL HIGH (ref 135–145)

## 2017-11-03 LAB — MRSA PCR SCREENING: MRSA by PCR: POSITIVE — AB

## 2017-11-03 MED ORDER — MUPIROCIN 2 % EX OINT
1.0000 "application " | TOPICAL_OINTMENT | Freq: Two times a day (BID) | CUTANEOUS | Status: AC
  Administered 2017-11-03 – 2017-11-07 (×10): 1 via NASAL
  Filled 2017-11-03: qty 22

## 2017-11-03 MED ORDER — FENTANYL CITRATE (PF) 100 MCG/2ML IJ SOLN
12.5000 ug | INTRAMUSCULAR | Status: DC | PRN
  Administered 2017-11-03 – 2017-11-06 (×4): 12.5 ug via INTRAVENOUS
  Filled 2017-11-03 (×4): qty 2

## 2017-11-03 MED ORDER — CHLORHEXIDINE GLUCONATE CLOTH 2 % EX PADS
6.0000 | MEDICATED_PAD | Freq: Every day | CUTANEOUS | Status: AC
  Administered 2017-11-03 – 2017-11-07 (×5): 6 via TOPICAL

## 2017-11-03 NOTE — Progress Notes (Signed)
Patient was calm, resting quietly in bed. Restraint was not necessary and was not applied.

## 2017-11-03 NOTE — Progress Notes (Signed)
PT Cancellation Note  Patient Details Name: Rudolpho SevinSarah Stoffer MRN: [id number] DOB: 06/21/1921   Cancelled Treatment:    Reason Eval/Treat Not Completed: Medical issues which prohibited therapy Checked with RN as pt has bed rest order.  RN reports MD wants to hold PT until after xray.   Mason Burleigh,[name] 11/03/2017, 2:54 PM Zenovia JarredKati Vivaan Helseth, PT, DPT 11/03/2017 Pager: [phone]

## 2017-11-03 NOTE — Progress Notes (Signed)
CRITICAL VALUE ALERT  Critical Value:  Positive MRSA  Date & Time Notied:  11/03/17@0005   Provider Notified: yes  Orders Received/Actions taken: Protocol activated

## 2017-11-03 NOTE — Progress Notes (Addendum)
PULMONARY / CRITICAL CARE MEDICINE   Name: Isabella [name] MRN: [id number] DOB: 05/04/1921    ADMISSION DATE:  11/02/2017 CONSULTATION DATE:  11/02/17   REFERRING MD:  Dr Sheaffer/ [facility]  CHIEF COMPLAINT:  AMS/ dyspnea   HISTORY OF PRESENT ILLNESS:   84 yobf NH resident, dnr status with severe dementia referred to er pm 2/3 wit FTT with poor po intake, worsening AMS x several days with initial w/u c/w large R PTX and severe hypernatemia and PCCM service asked to admit.  Daughter last saw mother 3 weeks pta and was able to recognize her and converse, feed herself but very inactive otherwise and there was no h/o R cp or cough that her daughter was aware of nor chest trauma  Of any kind.  PAST MEDICAL HISTORY :  She  has a past medical history of Dementia, Hearing loss, Hypertension, Seizures ([facility]), Vascular dementia, and Weight loss.  PAST SURGICAL HISTORY: She  has no past surgical history on file.  No Known Allergies  No current facility-administered medications on file prior to encounter.    Current Outpatient Medications on File Prior to Encounter  Medication Sig  . acetaminophen (TYLENOL) 500 MG tablet Take 1,000 mg 2 (two) times daily by mouth.  Marland Kitchen acetaminophen (TYLENOL) 500 MG tablet Take 1,000 mg by mouth every 6 (six) hours as needed for mild pain.  Marland Kitchen aspirin 81 MG chewable tablet Chew 81 mg by mouth daily.  . chlorthalidone (HYGROTON) 25 MG tablet Take 12.5 mg by mouth every morning.  . Cholecalciferol (VITAMIN D) 2000 units tablet Take 2,000 Units by mouth daily.  Marland Kitchen docusate sodium (COLACE) 100 MG capsule Take 100 mg by mouth daily.   Marland Kitchen levETIRAcetam (KEPPRA) 250 MG tablet Take 250 mg every evening by mouth.  . levETIRAcetam (KEPPRA) 500 MG tablet Take 1 tablet (500 mg total) by mouth 2 (two) times daily. (Patient taking differently: Take 500 mg every morning by mouth. )  . LORazepam (ATIVAN) 0.5 MG tablet Take 0.5 mg by mouth 2 (two) times daily. Also takes 0.5 mg as needed for  agitation.  Marland Kitchen LORazepam (ATIVAN) 0.5 MG tablet Take 0.25 mg by mouth daily as needed (agitation).  . Melatonin 3 MG CAPS Take 3 mg by mouth at bedtime.  . mirtazapine (REMERON) 15 MG tablet Take 7.5 mg by mouth at bedtime.  . Nutritional Supplements (NUTRITIONAL DRINK PO) Take 237 mLs daily by mouth. Medpass  . Polyethyl Glycol-Propyl Glycol (SYSTANE) 0.4-0.3 % SOLN Place 1 drop into both eyes 2 (two) times daily.  . potassium chloride SA (K-DUR,KLOR-CON) 20 MEQ tablet Take 20 mEq by mouth daily.  . risperiDONE (RISPERDAL) 0.5 MG tablet Take 0.5 mg by mouth at bedtime.  . traMADol (ULTRAM) 50 MG tablet Take 50 mg 2 (two) times daily by mouth.    FAMILY HISTORY:  Her has no family status information on file.    SOCIAL HISTORY: She  reports that  has never smoked. she has never used smokeless tobacco. She reports that she does not drink alcohol or use drugs.  REVIEW OF SYSTEMS:   Not available   SUBJECTIVE:  Stable overnight.   VITAL SIGNS: BP 115/73 (BP Location: Left Arm)   Pulse 79   Temp 97.9 F (36.6 C) (Oral)   Resp 18   Ht 5' (1.524 m)   Wt 96 lb 12.5 oz (43.9 kg)   SpO2 100%   BMI 18.90 kg/m   HEMODYNAMICS:    VENTILATOR SETTINGS:  INTAKE / OUTPUT: I/O last 3 completed shifts: In: 1673.3 [I.V.:1673.3] Out: 5 [Chest Tube:5]  PHYSICAL EXAMINATION: Gen:      No acute distress, frail, catchetic HEENT:  EOMI, sclera anicteric Neck:     No masses; no thyromegaly Lungs:    Clear to auscultation bilaterally; normal respiratory effort CV:         Regular rate and rhythm; no murmurs Abd:      + bowel sounds; soft, non-tender; no palpable masses, no distension Ext:    No edema; adequate peripheral perfusion Skin:      Warm and dry; no rash Neuro: Somnolent (just received pain meds)  LABS:  BMET Recent Labs  Lab 11/02/17 1439 11/03/17 0500  NA 164* 160*  K 3.5 3.6  CL 127* 129*  CO2 25 27  BUN 67* 47*  CREATININE 1.40* 1.07*  GLUCOSE 110* 152*     Electrolytes Recent Labs  Lab 11/02/17 1439 11/03/17 0500  CALCIUM 10.1 8.9    CBC Recent Labs  Lab 11/02/17 1439  WBC 6.6  HGB 16.7*  HCT 52.4*  PLT 235    Coag's No results for input(s): APTT, INR in the last 168 hours.  Sepsis Markers No results for input(s): LATICACIDVEN, PROCALCITON, O2SATVEN in the last 168 hours.  ABG No results for input(s): PHART, PCO2ART, PO2ART in the last 168 hours.  Liver Enzymes Recent Labs  Lab 11/02/17 1439  AST 45*  ALT 47  ALKPHOS 93  BILITOT 1.0  ALBUMIN 4.3    Cardiac Enzymes No results for input(s): TROPONINI, PROBNP in the last 168 hours.  Glucose No results for input(s): GLUCAP in the last 168 hours.  Imaging Dg Chest 2 View  Result Date: 11/02/2017 CLINICAL DATA:  Shortness of breath EXAM: CHEST  2 VIEW COMPARISON:  08/16/2017 FINDINGS: There is a large right pneumothorax measuring greater than 90%. There is leftward shift of the trachea concerning for a tension pneumothorax. The left lung is clear. There is no left pneumothorax. There is no pleural effusion. The heart mediastinum are stable. The osseous structures are unremarkable. IMPRESSION: Large right pneumothorax measuring greater than 90% with leftward shift of the trachea concerning for a tension pneumothorax. Critical Value/emergent results were called by telephone at the time of interpretation on 11/02/2017 at 2:12 pm to Dr. Lorre Nick , who verbally acknowledged these results. Electronically Signed   By: Elige Ko   On: 11/02/2017 14:14   Ct Head Wo Contrast  Result Date: 11/02/2017 CLINICAL DATA:  Altered mental status. EXAM: CT HEAD WITHOUT CONTRAST TECHNIQUE: Contiguous axial images were obtained from the base of the skull through the vertex without intravenous contrast. COMPARISON:  03/10/2017. FINDINGS: Brain: Diffusely enlarged ventricles and subarachnoid spaces. Patchy white matter low density in both cerebral hemispheres. No intracranial hemorrhage,  mass lesion or CT evidence of acute infarction. Vascular: No hyperdense vessel or unexpected calcification. Skull: Normal. Negative for fracture or focal lesion. Sinuses/Orbits: Status post bilateral cataract extraction. Unremarkable paranasal sinuses. Chronic bilateral mastoid air cell opacification and underdevelopment. Other: None. IMPRESSION: 1. No acute abnormality. 2. Stable cerebral and cerebellar atrophy and chronic small vessel white matter ischemic changes. Electronically Signed   By: Beckie Salts M.D.   On: 11/02/2017 14:33   Dg Chest Port 1 View  Result Date: 11/03/2017 CLINICAL DATA:  Follow-up pneumothorax. EXAM: PORTABLE CHEST 1 VIEW COMPARISON:  Portable chest x-ray of November 02, 2017 FINDINGS: The right apical pneumothorax is again demonstrated and appears stable allowing for differences in  positioning. The right chest tube tip projects over the posteromedial aspect of the right ninth rib. There is no significant pleural effusion. The right hemidiaphragm remains elevated. There is air in the right axillary soft tissues. There is no mediastinal shift. There is hazy increased density in the right mid lung more conspicuous today. The left lung is well-expanded and clear. The heart and pulmonary vascularity are normal. There calcification in the wall of the aortic arch. IMPRESSION: Stable approximately 10% right apical pneumothorax. Stable positioning of the right-sided chest tube. Small amount of subcutaneous emphysema in the right axillary region. Hazy increased density in the right mid to lower lung which is new and may reflect developing infiltrate. No CHF. Thoracic aortic atherosclerosis. Electronically Signed   By: David  Jordan M.D.   On: 11/03/2017 08:47   Dg [place] Port 1 View  Result Date: 11/02/2017 CLINICAL DATA:  Right pneumothorax status post chest tube insertion. EXAM: PORTABLE CHEST 1 VIEW COMPARISON:  11/02/2017 FINDINGS: Right-sided chest tube has been placed, with significant  decrease in right pneumothorax. Small right apical pneumothorax persists, measuring 12 millimeters at the apex. There is persistent elevation of the right hemidiaphragm. Lungs are clear. There is a bone island within the right posterior 5th rib. IMPRESSION: Smaller right pneumothorax following placement of right-sided chest tube. Electronically Signed   By: Norva PavlovElizabeth  Brown M.D.   On: 11/02/2017 15:54   SIGNIFICANT EVENTS:   LINES/TUBES: R chest tube 11/02/17 >>>  ASSESSMENT / PLAN: 84 year old with spontaneous right pneumothorax with tension Status post chest tube.   Tension R PTX. Spontaneous  Continue chest tube to suction. Chest x-ray today is read as persistent right apical pneumothorax however I cannot appreciate it on my review.  No air leak noted Place chest tube to water seal. Repeat CXR in afternoon Low dose fentanyl for pain  Severe hypernatremia Continue gentle fluids, Free water with D5 + Kcl  Severe underlying dementia/ non-verbal  DNR, supportive care.  Keppra, remeron  Family: Daughter updated at bedside on 2/4  Chilton GreathousePraveen Sherriann Szuch MD [facility] Pager [phone] If no answer or after 3pm call: [phone] 11/03/2017, 12:01 PM

## 2017-11-03 NOTE — Plan of Care (Signed)
  Nutrition: Adequate nutrition will be maintained 11/03/2017 1152 - Progressing by Shirleen Schirmerraegbunam, Alfredia Desanctis K, RN

## 2017-11-03 NOTE — Care Management Note (Signed)
Case Management Note  Patient Details  Name: Isabella [name] MRN: [id number] Date of Birth: 08/28/1921  Subjective/Objective: 84 y/o f admitted w/Pneumothorax. From ALF-[facility]-CSW following. PT cons-await recc.                   Action/Plan:d/c plan ALF   Expected Discharge Date:                  Expected Discharge Plan:  Assisted Living / Rest Home  In-House Referral:     Discharge planning Services  CM Consult  Post Acute Care Choice:    Choice offered to:     DME Arranged:    DME Agency:     HH Arranged:    HH Agency:     Status of Service:  In process, will continue to follow  If discussed at Long Length of Stay Meetings, dates discussed:    Additional Comments:  Lanier ClamMahabir, Najmo Pardue, RN 11/03/2017, 10:08 AM

## 2017-11-04 ENCOUNTER — Inpatient Hospital Stay (HOSPITAL_COMMUNITY): Payer: Medicare Other

## 2017-11-04 ENCOUNTER — Encounter (HOSPITAL_COMMUNITY): Payer: Self-pay

## 2017-11-04 LAB — URINE CULTURE: Culture: NO GROWTH

## 2017-11-04 LAB — BASIC METABOLIC PANEL
Anion gap: 5 (ref 5–15)
BUN: 24 mg/dL — AB (ref 6–20)
CHLORIDE: 120 mmol/L — AB (ref 101–111)
CO2: 27 mmol/L (ref 22–32)
CREATININE: 0.81 mg/dL (ref 0.44–1.00)
Calcium: 8.6 mg/dL — ABNORMAL LOW (ref 8.9–10.3)
GFR calc Af Amer: >60 mL/min (ref >60)
GFR calc non Af Amer: >60 mL/min (ref >60)
Glucose, Bld: 126 mg/dL — ABNORMAL HIGH (ref 65–99)
Potassium: 4.1 mmol/L (ref 3.5–5.1)
SODIUM: 152 mmol/L — AB (ref 135–145)

## 2017-11-04 LAB — MAGNESIUM: MAGNESIUM: 2.1 mg/dL (ref 1.7–2.4)

## 2017-11-04 LAB — PHOSPHORUS: Phosphorus: 1.8 mg/dL — ABNORMAL LOW (ref 2.5–4.6)

## 2017-11-04 NOTE — Progress Notes (Signed)
Unable to maintain continuous pulse ox on patient.  When spot checking, oxygen saturation appears to be 92 and above.  Patient has been very alert, active and comfortable this afternoon since chest tube has been pulled.  Patient was singing and feeding herself, attempting to talk to nursing staff and smiling.  Patient stable at this time. Will continue to monitor.

## 2017-11-04 NOTE — Progress Notes (Signed)
Post-ct removal cxr: ~ 10% right apical PTX.  Plan 100% FIO2 Repeat CXR 1400  Simonne MartinetPeter E Breigh Annett ACNP-BC [facility] Pager # [phone] OR # [phone] if no answer

## 2017-11-04 NOTE — Progress Notes (Signed)
PULMONARY / CRITICAL CARE MEDICINE   Name: Isabella [name] MRN: [id number] DOB: Jul 21, 1921    ADMISSION DATE:  11/02/2017 CONSULTATION DATE:  11/02/17   REFERRING MD:  Dr Janoski/ [facility]  CHIEF COMPLAINT:  AMS/ dyspnea   HISTORY OF PRESENT ILLNESS:   43 yobf NH resident, dnr status with severe dementia referred to er pm 2/3 wit FTT with poor po intake, worsening AMS x several days with initial w/u c/w large R PTX and severe hypernatemia and PCCM service asked to admit.  Daughter last saw mother 3 weeks pta and was able to recognize her and converse, feed herself but very inactive otherwise and there was no h/o R cp or cough that her daughter was aware of nor chest trauma  Of any kind.   SUBJECTIVE:  No issues VITAL SIGNS: BP 106/72 (BP Location: Left Arm)   Pulse 68   Temp 97.6 F (36.4 C) (Oral)   Resp 18   Ht 5' (1.524 m)   Wt 96 lb 12.5 oz (43.9 kg)   SpO2 100%   BMI 18.90 kg/m   HEMODYNAMICS:    VENTILATOR SETTINGS:    INTAKE / OUTPUT: I/O last 3 completed shifts: In: 3483.3 [P.O.:10; I.V.:3473.3] Out: 67 [Chest Tube:70]  PHYSICAL EXAMINATION: General: This is a confused 84 year old female she is nonverbal, but no distress HEENT: She is pocketing food in her oral cavity Pulmonary: Decreased bases, no accessory use, right chest tube now removed following negative air leak and clear chest x-ray Cardiac: Regular rate and rhythm Abdomen: Soft nontender Extremities: Strong, no focal weakness, brisk cap refill, no edema. Neuro: Nonverbal, moves all extremities, combative at times  LABS:  BMET Recent Labs  Lab 11/02/17 1439 11/03/17 0500 11/04/17 0615  NA 164* 160* 152*  K 3.5 3.6 4.1  CL 127* 129* 120*  CO2 25 27 27   BUN 67* 47* 24*  CREATININE 1.40* 1.07* 0.81  GLUCOSE 110* 152* 126*    Electrolytes Recent Labs  Lab 11/02/17 1439 11/03/17 0500 11/04/17 0615  CALCIUM 10.1 8.9 8.6*  MG  --   --  2.1  PHOS  --   --  1.8*    CBC Recent Labs  Lab  11/02/17 1439 11/03/17 1406  WBC 6.6 9.3  HGB 16.7* 13.5  HCT 52.4* 43.4  PLT 235 169    Coag's No results for input(s): APTT, INR in the last 168 hours.  Sepsis Markers No results for input(s): LATICACIDVEN, PROCALCITON, O2SATVEN in the last 168 hours.  ABG No results for input(s): PHART, PCO2ART, PO2ART in the last 168 hours.  Liver Enzymes Recent Labs  Lab 11/02/17 1439  AST 45*  ALT 47  ALKPHOS 93  BILITOT 1.0  ALBUMIN 4.3    Cardiac Enzymes No results for input(s): TROPONINI, PROBNP in the last 168 hours.  Glucose No results for input(s): GLUCAP in the last 168 hours.  Imaging Dg Chest Port 1 View  Result Date: 11/04/2017 CLINICAL DATA:  Respiratory failure EXAM: PORTABLE CHEST 1 VIEW COMPARISON:  11/03/2017 FINDINGS: Right-sided chest tube is again noted. No pneumothorax is seen. Cardiac shadow is stable. The lungs are well aerated without focal infiltrate or sizable effusion. Old rib fractures are noted on the left. No new focal abnormality is noted. IMPRESSION: Stable right chest tube.  No definitive pneumothorax is noted. Electronically Signed   By: Alcide Clever M.D.   On: 11/04/2017 07:14   Dg Chest Port 1 View  Result Date: 11/03/2017 CLINICAL DATA:  Pneumothorax EXAM:  PORTABLE CHEST 1 VIEW COMPARISON:  11/03/2017 FINDINGS: Right basilar chest tube unchanged in position. Small right apical pneumothorax slightly improved. No effusion. Lungs are clear without infiltrate effusion or edema. IMPRESSION: Right chest tube remains in place. Improvement in small right apical pneumothorax since earlier today. Electronically Signed   By: Marlan Palauharles  Clark M.D.   On: 11/03/2017 15:40   SIGNIFICANT EVENTS:   LINES/TUBES: R chest tube 11/02/17 >>> 2/5  ASSESSMENT / PLAN: 84 year old with spontaneous right pneumothorax with tension Status post chest tube.   Tension R PTX. Spontaneous -resolved 2/5 (chest tube removed) Plan Repeat CXR this afternoon and in CXR. If no new  PTX can leave as early as 2/6   Severe hypernatremia in setting of volume depletion  Plan Cont D5w Needs SNF   Severe underlying dementia/ non-verbal  Cont keppra Cont supportive care  I do not see how she can be safely at ALF needs SNF.  Will ask [facility] to assume care as she continues IV hydration and assessment for snf placement   Family: Daughter updated at bedside on 2/4  Simonne MartinetPeter E Veronica Guerrant ACNP-BC [facility] Pul[facility]/Critical [facility] Pager # [phone] OR # [phone] if no answer  11/04/2017, 10:53 AM

## 2017-11-04 NOTE — Progress Notes (Signed)
2pm CXR w/out sig change.   Plan Cont pulse ox Cont 100% NRB  Repeat CXR in am   Simonne MartinetPeter E Cuyler Vandyken ACNP-BC [facility]Critical [facility] Pager # [phone] OR # [phone] if no answer

## 2017-11-04 NOTE — Progress Notes (Signed)
Initial Nutrition Assessment  DOCUMENTATION CODES:   Not applicable  INTERVENTION:  - Will order Magic Cup BID with meals, each supplement provides 290 kcal and 9 grams of protein - Continue to encourage PO intakes.   NUTRITION DIAGNOSIS:   Inadequate oral intake related to lethargy/confusion, acute illness as evidenced by meal completion < 25%.  GOAL:   Patient will meet greater than or equal to 90% of their needs  MONITOR:   PO intake, Supplement acceptance, Weight trends, Labs  REASON FOR ASSESSMENT:   Malnutrition Screening Tool  ASSESSMENT:   84 year-old NH resident, DNR status with severe dementia referred to ED 2/3 PM with FTT, poor po intake, and worsening AMS for several days. Initial work-up consistent with large R PTX and severe hypernatemia.  BMI indicates normal weight/borderline underweight. Per chart review, pt consumed 50% of dinner last night and 10% of breakfast this AM. Pt is disoriented, unable to provide any information. No family/visitors present. Spoke with tech, who was at bedside at time of RD visit. She had attempted to feed pt breakfast and lunch and stated that pt ate "very little" of both meals, that pt was unwilling to take items into her mouth.   Per chart review, pt has lost 23 lbs (19% body weight) in the past 13 months. This is not significant for time frame. Also noted changes as seen in table below.  Wt Readings from Last 10 Encounters:  11/02/17 96 lb 12.5 oz (43.9 kg)  03/10/17 120 lb (54.4 kg)  09/30/16 119 lb 3.2 oz (54.1 kg)  09/27/16 100 lb (45.4 kg)  07/21/16 98 lb (44.5 kg)    Medications reviewed. Labs reviewed; Na: 152 mmol/L, Cl: 120 mmol/L, BUN: 24 mg/dL, Ca: 8.6 mg/dL, Phos: 1.8 mg/dL.   IVF: D5-40 mEq KCl @ 100 mL/hr (408 kcal).     NUTRITION - FOCUSED PHYSICAL EXAM:    Most Recent Value  Orbital Region  Unable to assess  Upper Arm Region  Moderate depletion  Thoracic and Lumbar Region  Unable to assess  Buccal  Region  Mild depletion  Temple Region  Moderate depletion  Clavicle Bone Region  Moderate depletion  Clavicle and Acromion Bone Region  Moderate depletion  Scapular Bone Region  Unable to assess  Dorsal Hand  No depletion  Patellar Region  Mild depletion  Anterior Thigh Region  No depletion  Posterior Calf Region  Mild depletion  Edema (RD Assessment)  None  Hair  Reviewed  Eyes  Reviewed  Mouth  Reviewed  Skin  Reviewed  Nails  Reviewed       Diet Order:  DIET SOFT Room service appropriate? Yes; Fluid consistency: Thin  EDUCATION NEEDS:   No education needs have been identified at this time  Skin:  Skin Assessment: Reviewed RN Assessment  Last BM:  PTA/unknown  Height:   Ht Readings from Last 1 Encounters:  11/02/17 5' (1.524 m)    Weight:   Wt Readings from Last 1 Encounters:  11/02/17 96 lb 12.5 oz (43.9 kg)    Ideal Body Weight:  45.45 kg  BMI:  Body mass index is 18.9 kg/m.  Estimated Nutritional Needs:   Kcal:  1100-1320 (25-30 kcal/kg)  Protein:  44-52 grams (1-1.2 garms/kg)  Fluid:  >/= 1.4 L/day      Trenton GammonJessica Annye Forrey, MS, RD, LDN, [facility] Inpatient Clinical Dietitian Pager # [phone] After hours/weekend pager # [phone]

## 2017-11-04 NOTE — Progress Notes (Signed)
PT Cancellation Note  Patient Details Name: Isabella SevinSarah [name] MRN: [id number] DOB: 04/20/1921   Cancelled Treatment:    Reason Eval/Treat Not Completed: Other (comment)(chest tube removed today, has been receiving chest xrays to monitor, RN agreeable for PT to check back tomorrow, pt on NRB)    Deniese Oberry,[name] 11/04/2017, 3:29 PM [name], PT, DPT 11/04/2017 Pager: [phone]

## 2017-11-05 ENCOUNTER — Other Ambulatory Visit: Payer: Self-pay

## 2017-11-05 ENCOUNTER — Encounter (HOSPITAL_COMMUNITY): Payer: Self-pay

## 2017-11-05 ENCOUNTER — Inpatient Hospital Stay (HOSPITAL_COMMUNITY): Payer: Medicare Other

## 2017-11-05 LAB — BASIC METABOLIC PANEL
ANION GAP: 2 — AB (ref 5–15)
BUN: 16 mg/dL (ref 6–20)
CO2: 25 mmol/L (ref 22–32)
Calcium: 8.3 mg/dL — ABNORMAL LOW (ref 8.9–10.3)
Chloride: 115 mmol/L — ABNORMAL HIGH (ref 101–111)
Creatinine, Ser: 0.73 mg/dL (ref 0.44–1.00)
GFR calc Af Amer: >60 mL/min (ref >60)
GFR calc non Af Amer: >60 mL/min (ref >60)
Glucose, Bld: 112 mg/dL — ABNORMAL HIGH (ref 65–99)
POTASSIUM: 4.2 mmol/L (ref 3.5–5.1)
Sodium: 142 mmol/L (ref 135–145)

## 2017-11-05 MED ORDER — POTASSIUM CL IN DEXTROSE 5% 20 MEQ/L IV SOLN
20.0000 meq | INTRAVENOUS | Status: DC
  Administered 2017-11-05 – 2017-11-06 (×3): 20 meq via INTRAVENOUS
  Filled 2017-11-05 (×3): qty 1000

## 2017-11-05 MED ORDER — SODIUM GLYCEROPHOSPHATE 1 MMOLE/ML IV SOLN
20.0000 mmol | Freq: Once | INTRAVENOUS | Status: AC
  Administered 2017-11-05: 20 mmol via INTRAVENOUS
  Filled 2017-11-05: qty 20

## 2017-11-05 NOTE — Progress Notes (Signed)
PROGRESS NOTE  Taquana Bartley ZOX:[id number] DOB: [birth year] DOA: 11/02/2017 PCP: Mortimer Fries, PA  HPI/Recap of past 24 hours:  Awake and alert, demented, only oriented to self  Assessment/Plan: Active Problems:   Pneumothorax on right   Hypernatremia  Spontaneous tension pneumothorax She was admitted to icu on admission. S/p chest tube and chest tube removed, she is transferred to hospitalist service today. Per critical care "Stable pneumothorax noted post chest tube removal. No indication for repeat chest tube."  Hyponatremia secondary to dehydration Sodium 164 on presentation Continue D5W.  Hypophosphatemia: replace phos  AKI ON CKDII: BUN 67/CR 1.4 ON PRESENTATION ua concentrated with ketone , but no infection. Normalized with hydration, bun 16/cr 0.73 today.  H/o seizure: continue home meds. keppra  Dementia/FTT: Continue home meds risperidone, prn ativan Daughter last saw mother 3 weeks ago prior to this hospitalization, per daughter patient was able to recognize her and converse, feed herself but very inactive otherwise  Per daughter patient has  weight loss (per chart review , patient weight 120 in 02/2017, currently she weight 103 pounds) Discussed with daughter at bedside about plan of care for this hospitalization, confirmed DNR status, discussed feeding tube, snf placement   MRSA colonization: contact precaution, decolonization.  Code Status: DNR  Family Communication: patient and daughter at bedside  Disposition Plan: SNF with palliative care following   Consultants:  Critical care  Procedures:  Chest tube placement and removal  Antibiotics:  none   Objective: BP 128/67 (BP Location: Left Arm)   Pulse 68   Temp (!) 97.4 F (36.3 C) (Axillary)   Resp 14   Ht 5' (1.524 m)   Wt 47 kg (103 lb 9.9 oz)   SpO2 100%   BMI 20.24 kg/m   Intake/Output Summary (Last 24 hours) at 11/05/2017 1303 Last data filed at 11/05/2017 0700 Gross per 24 hour    Intake 2620 ml  Output 350 ml  Net 2270 ml   Filed Weights   11/02/17 1738 11/05/17 0616  Weight: 43.9 kg (96 lb 12.5 oz) 47 kg (103 lb 9.9 oz)    Exam: Patient is examined daily including today on 11/05/2017, exams remain the same as of yesterday except that has changed    General:  Frail, thin, demented, alert, taking, oriented to self only  Cardiovascular: RRR  Respiratory: CTABL  Abdomen: Soft/ND/NT, positive BS  Musculoskeletal: No Edema  Neuro: alert, oriented to self only  Data Reviewed: Basic Metabolic Panel: Recent Labs  Lab 11/02/17 1439 11/03/17 0500 11/04/17 0615 11/05/17 0943  NA 164* 160* 152* 142  K 3.5 3.6 4.1 4.2  CL 127* 129* 120* 115*  CO2 25 27 27 25   GLUCOSE 110* 152* 126* 112*  BUN 67* 47* 24* 16  CREATININE 1.40* 1.07* 0.81 0.73  CALCIUM 10.1 8.9 8.6* 8.3*  MG  --   --  2.1  --   PHOS  --   --  1.8*  --    Liver Function Tests: Recent Labs  Lab 11/02/17 1439  AST 45*  ALT 47  ALKPHOS 93  BILITOT 1.0  PROT 8.9*  ALBUMIN 4.3   No results for input(s): LIPASE, AMYLASE in the last 168 hours. No results for input(s): AMMONIA in the last 168 hours. CBC: Recent Labs  Lab 11/02/17 1439 11/03/17 1406  WBC 6.6 9.3  NEUTROABS 5.1  --   HGB 16.7* 13.5  HCT 52.4* 43.4  MCV 100.2* 99.3  PLT 235 169   Cardiac Enzymes:  No results for input(s): CKTOTAL, CKMB, CKMBINDEX, TROPONINI in the last 168 hours. BNP (last 3 results) No results for input(s): BNP in the last 8760 hours.  ProBNP (last 3 results) No results for input(s): PROBNP in the last 8760 hours.  CBG: No results for input(s): GLUCAP in the last 168 hours.  Recent Results (from the past 240 hour(s))  Urine Culture     Status: None   Collection Time: 11/02/17  3:00 PM  Result Value Ref Range Status   Specimen Description   Final    URINE, CLEAN CATCH Performed at [facility], [address], [place], [place] [address]    Special Requests   Final     NONE Performed at [facility], [address], [place], [place] [address]    Culture   Final    NO GROWTH Performed at [facility], [address] [place], [place] [address]    Report Status 11/04/2017 FINAL  Final  MRSA PCR Screening     Status: Abnormal   Collection Time: 11/02/17  8:42 PM  Result Value Ref Range Status   MRSA by PCR POSITIVE (A) NEGATIVE Final    Comment:        The GeneXpert MRSA Assay (FDA approved for NASAL specimens only), is one component of a comprehensive MRSA colonization surveillance program. It is not intended to diagnose MRSA infection nor to guide or monitor treatment for MRSA infections. RESULT CALLED TO, READ BACK BY AND VERIFIED WITH: H NJAJA,RN @0006  11/03/17 MKELLY 11/03/17 Performed at [facility], [address], [place], [place] [address]      Studies: Dg Chest Port 1 View  Result Date: 11/05/2017 CLINICAL DATA:  Right-sided pneumothorax EXAM: PORTABLE CHEST 1 VIEW COMPARISON:  November 04, 2017 FINDINGS: There is a persistent small apical pneumothorax on the right without tension component. There is no appreciable edema or consolidation. There is medial left base atelectasis. There are small granulomas in the right upper lobe. Heart is upper normal in size with pulmonary vascular within normal limits. There is aortic atherosclerosis. No bone lesions. IMPRESSION: Stable right apical pneumothorax that tension component. Medial left base atelectasis. No edema or consolidation. Stable cardiac silhouette. There is aortic atherosclerosis. Aortic Atherosclerosis (ICD10-I70.0). Electronically Signed   By: Bretta Bang III M.D.   On: 11/05/2017 07:06   Dg Chest Port 1 View  Result Date: 11/04/2017 CLINICAL DATA:  Altered level of consciousness, history of right-sided pneumothorax. EXAM: PORTABLE CHEST 1 VIEW COMPARISON:  Portable chest x-ray November 04, 2017 at 11:19 a.m. FINDINGS: A small right  apical pneumothorax persists. It has not appreciably changed in size. There is stable patchy density in the right upper lobe. There is stable right hilar prominence. The left lung is well-expanded. There is no pleural effusion. There is mild elevation of the right hemidiaphragm. The heart is normal in size. There is no pulmonary vascular congestion. IMPRESSION: Stable approximately 10-15% right apical pneumothorax. Electronically Signed   By: David  [place] M.D.   On: 11/04/2017 14:19    Scheduled Meds: . Chlorhexidine Gluconate Cloth  6 each Topical Q0600  . levETIRAcetam  250 mg Oral QHS  . levETIRAcetam  500 mg Oral QAC breakfast  . mirtazapine  7.5 mg Oral QHS  . mupirocin ointment  1 application Nasal BID  . risperiDONE  0.5 mg Oral QHS    Continuous Infusions: . dextrose 5 % with KCl 20 mEq / L 20 mEq (11/05/17  1158)  . sodium glycerophosphate 0.9% NaCl IVPB 20 mmol (11/05/17 1159)     Time spent: 35 mins I have personally reviewed and interpreted on  11/05/2017 daily labs,  imagings as discussed above under date review session and assessment and plans.  I reviewed all nursing notes,  consultant notes,  vitals, pertinent old records  I have discussed plan of care as described above with RN , patient and family on 11/05/2017   Albertine GratesFang Mattisyn Cardona MD, PhD  [facility] Pager [phone]. If 7PM-7AM, please contact night-coverage at [email or web address], password [facility] 11/05/2017, 1:03 PM  LOS: 3 days

## 2017-11-05 NOTE — Evaluation (Signed)
Physical Therapy Evaluation Patient Details Name: Isabella SevinSarah [name] MRN: [id number] DOB: 11/15/1920 Today's Date: 11/05/2017   History of Present Illness  84 year old female from ALF with severe dementia referred to ER pm 2/3 with FTT with poor po intake, worsening AMS x several days with initial w/u c/w large R PTX (s/p R chest tube 2/3-2/5) and severe hypernatemia   Clinical Impression  Pt admitted with above diagnosis. Pt currently with functional limitations due to the deficits listed below (see PT Problem List).  Pt will benefit from skilled PT to increase their independence and safety with mobility to allow discharge to the venue listed below.  Pt awakened from sleep and assisted over to recliner.  Pt requiring some assist however also a little resistant to mobilizing.  Pt poor historian due to severe dementia.  IF ALF unable to provide current level of assist then pt may need SNF.     Follow Up Recommendations SNF    Equipment Recommendations  None recommended by PT    Recommendations for Other Services       Precautions / Restrictions Precautions Precautions: Fall      Mobility  Bed Mobility Overal bed mobility: Needs Assistance Bed Mobility: Supine to Sit     Supine to sit: Mod assist     General bed mobility comments: assist mostly due to resistance, NT assisted as pt was laying on soiled bed pads, pt appears more agitated sitting EOB (also needed to change gown)  Transfers Overall transfer level: Needs assistance Equipment used: 2 person hand held assist Transfers: Sit to/from [employer]GI CorporationStand;Stand Pivot Transfers Sit to Stand: Min assist;+2 safety/equipment Stand pivot transfers: Min assist;+2 safety/equipment       General transfer comment: assist to rise and steady, pt able to stand upright and self assist however reluctant to initiate and assist sit to stand, multimodal cues provided due to dementia  Ambulation/Gait                Stairs             Wheelchair Mobility    Modified Rankin (Stroke Patients Only)       Balance Overall balance assessment: Needs assistance Sitting-balance support: No upper extremity supported;Feet supported Sitting balance-Leahy Scale: Fair     Standing balance support: Bilateral upper extremity supported Standing balance-Leahy Scale: Poor                               Pertinent Vitals/Pain Pain Assessment: Faces Faces Pain Scale: Hurts a little bit Pain Intervention(s): Repositioned;Monitored during session    Home Living Family/patient expects to be discharged to:: Assisted living                      Prior Function Level of Independence: Needs assistance   Gait / Transfers Assistance Needed: hx dementia, poor historian, from ALF per chart review           Hand Dominance        Extremity/Trunk Assessment        Lower Extremity Assessment Lower Extremity Assessment: Generalized weakness    Cervical / Trunk Assessment Cervical / Trunk Assessment: Normal  Communication   Communication: No difficulties  Cognition Arousal/Alertness: Awake/alert Behavior During Therapy: Agitated Overall Cognitive Status: History of cognitive impairments - at baseline  General Comments: hx dementia, likely baseline however no family present      General Comments      Exercises     Assessment/Plan    PT Assessment    PT Problem List         PT Treatment Interventions      PT Goals (Current goals can be found in the Care Plan section)  Acute Rehab PT Goals PT Goal Formulation: Patient unable to participate in goal setting Time For Goal Achievement: 11/19/17 Potential to Achieve Goals: Fair    Frequency     Barriers to discharge        Co-evaluation               AM-PAC PT "6 Clicks" Daily Activity  Outcome Measure Difficulty turning over in bed (including adjusting bedclothes, sheets and  blankets)?: Unable Difficulty moving from lying on back to sitting on the side of the bed? : Unable Difficulty sitting down on and standing up from a chair with arms (e.g., wheelchair, bedside commode, etc,.)?: Unable Help needed moving to and from a bed to chair (including a wheelchair)?: A Lot Help needed walking in hospital room?: Total Help needed climbing 3-5 steps with a railing? : Total 6 Click Score: 7    End of Session   Activity Tolerance: Patient limited by fatigue Patient left: in chair;with chair alarm set;with call bell/phone within reach Nurse Communication: Mobility status PT Visit Diagnosis: Unsteadiness on feet (R26.81);Difficulty in walking, not elsewhere classified (R26.2)    Time: 1610-9604 PT Time Calculation (min) (ACUTE ONLY): 17 min   Charges:   PT Evaluation $PT Eval Low Complexity: 1 Low     PT G Codes:        Zenovia Jarred, PT, DPT 11/05/2017 Pager: [phone]  Maida Sale E 11/05/2017, 11:05 AM

## 2017-11-05 NOTE — Progress Notes (Signed)
PULMONARY / CRITICAL CARE MEDICINE   Name: Isabella [name] MRN: [id number] DOB: [birth year]    ADMISSION DATE:  11/02/2017 CONSULTATION DATE:  11/02/17   REFERRING MD:  Dr Dicenso/ [facility]  CHIEF COMPLAINT:  AMS/ dyspnea   HISTORY OF PRESENT ILLNESS:   26 yobf NH resident, dnr status with severe dementia referred to er pm 2/3 wit FTT with poor po intake, worsening AMS x several days with initial w/u c/w large R PTX and severe hypernatemia and PCCM service asked to admit.  Daughter last saw mother 3 weeks pta and was able to recognize her and converse, feed herself but very inactive otherwise and there was no h/o R cp or cough that her daughter was aware of nor chest trauma  Of any kind.   SUBJECTIVE:  Resting in bed  VITAL SIGNS: BP 128/67 (BP Location: Left Arm)   Pulse 68   Temp (!) 97.4 F (36.3 C) (Axillary)   Resp 14   Ht 5' (1.524 m)   Wt 103 lb 9.9 oz (47 kg)   SpO2 100%   BMI 20.24 kg/m   HEMODYNAMICS:    VENTILATOR SETTINGS:    INTAKE / OUTPUT: I/O last 3 completed shifts: In: 4080 [P.O.:180; I.V.:3900] Out: 351 [Urine:351]  PHYSICAL EXAMINATION: General: confused at times combative. NAD this am HEENT: NCAT no JVD, edentulous  Pulmonary: clear  Cardiac: rrr Abdomen: soft NT Extremities: strong Neuro:awake, confused. Very HOH  LABS:  BMET Recent Labs  Lab 11/02/17 1439 11/03/17 0500 11/04/17 0615  NA 164* 160* 152*  K 3.5 3.6 4.1  CL 127* 129* 120*  CO2 25 27 27   BUN 67* 47* 24*  CREATININE 1.40* 1.07* 0.81  GLUCOSE 110* 152* 126*    Electrolytes Recent Labs  Lab 11/02/17 1439 11/03/17 0500 11/04/17 0615  CALCIUM 10.1 8.9 8.6*  MG  --   --  2.1  PHOS  --   --  1.8*    CBC Recent Labs  Lab 11/02/17 1439 11/03/17 1406  WBC 6.6 9.3  HGB 16.7* 13.5  HCT 52.4* 43.4  PLT 235 169    Coag's No results for input(s): APTT, INR in the last 168 hours.  Sepsis Markers No results for input(s): LATICACIDVEN, PROCALCITON, O2SATVEN in the last  168 hours.  ABG No results for input(s): PHART, PCO2ART, PO2ART in the last 168 hours.  Liver Enzymes Recent Labs  Lab 11/02/17 1439  AST 45*  ALT 47  ALKPHOS 93  BILITOT 1.0  ALBUMIN 4.3    Cardiac Enzymes No results for input(s): TROPONINI, PROBNP in the last 168 hours.  Glucose No results for input(s): GLUCAP in the last 168 hours.  Imaging Dg Chest Port 1 View  Result Date: 11/05/2017 CLINICAL DATA:  Right-sided pneumothorax EXAM: PORTABLE CHEST 1 VIEW COMPARISON:  November 04, 2017 FINDINGS: There is a persistent small apical pneumothorax on the right without tension component. There is no appreciable edema or consolidation. There is medial left base atelectasis. There are small granulomas in the right upper lobe. Heart is upper normal in size with pulmonary vascular within normal limits. There is aortic atherosclerosis. No bone lesions. IMPRESSION: Stable right apical pneumothorax that tension component. Medial left base atelectasis. No edema or consolidation. Stable cardiac silhouette. There is aortic atherosclerosis. Aortic Atherosclerosis (ICD10-I70.0). Electronically Signed   By: Bretta Bang III M.D.   On: 11/05/2017 07:06   Dg Chest Port 1 View  Result Date: 11/04/2017 CLINICAL DATA:  Altered level of consciousness, history of  right-sided pneumothorax. EXAM: PORTABLE CHEST 1 VIEW COMPARISON:  Portable chest x-ray November 04, 2017 at 11:19 a.m. FINDINGS: A small right apical pneumothorax persists. It has not appreciably changed in size. There is stable patchy density in the right upper lobe. There is stable right hilar prominence. The left lung is well-expanded. There is no pleural effusion. There is mild elevation of the right hemidiaphragm. The heart is normal in size. There is no pulmonary vascular congestion. IMPRESSION: Stable approximately 10-15% right apical pneumothorax. Electronically Signed   By: David  Swazil[place]Jordan M.D.   On: 11/04/2017 14:19   Dg Chest Port 1  View  Result Date: 11/04/2017 CLINICAL DATA:  Status post chest tube removal today. EXAM: PORTABLE CHEST 1 VIEW COMPARISON:  Single-view of the chest earlier today. FINDINGS: Right chest tube is been removed. The patient has a right pneumothorax estimated at 10-15%. The left lung is expanded and clear. Heart size is upper normal. No pleural fluid. Small volume of subcutaneous air in the right chest noted. Remote right rib fractures are seen. IMPRESSION: Small right pneumothorax estimated at 10-15% after chest tube removal today. Critical Value/emergent results were called by telephone at the time of interpretation on 11/04/2017 at 11:43 am to [facility] , who verbally acknowledged these results. Electronically Signed   By: Drusilla Kannerhomas  Dalessio M.D.   On: 11/04/2017 11:45   SIGNIFICANT EVENTS:   LINES/TUBES: R chest tube 11/02/17 >>> 2/5  ASSESSMENT / PLAN: 84 year old with spontaneous right pneumothorax with tension Status post chest tube.   Tension R PTX. Spontaneous -resolved 2/5 (chest tube removed); has small residual apical right PTX s/p removal PCXR review: smaller apical PTX ~5% Plan Repeat CXR in am Dc high flow oxygen  Remove sutures 2/12  Severe hypernatremia in setting of volume depletion  Plan Cont D5w Needs SNF   Severe underlying dementia/ non-verbal  No change in Keppra  Needs SNF  Simonne MartinetPeter E Donye Campanelli ACNP-BC [facility] Pulmonary/Critical Care Pager # [phone] OR # [phone] if no answer

## 2017-11-05 NOTE — Clinical Social Work Note (Signed)
Clinical Social Work Assessment  Patient Details  Name: Isabella [name] MRN: [id number] Date of Birth: [birth year]  Date of referral:  11/05/17               Reason for consult:  Facility Placement                Permission sought to share information with:    Permission granted to share information::     Name::        Agency::     Relationship::     Contact Information:     Housing/Transportation Living arrangements for the past 2 months:  Assisted Dealer of Information:  Facility, Adult Children(Daughter - Isabella [name]) Patient Interpreter Needed:  None Criminal Activity/Legal Involvement Pertinent to Current Situation/Hospitalization:  No - Comment as needed Significant Relationships:  Adult Children Lives with:  Facility Resident Do you feel safe going back to the place where you live?  (PT recommending SNF) Need for family participation in patient care:  Yes (Comment)  Care giving concerns:  Patient from [facility]F (memory care unit). ALF staff reported that patient is total care but could ambulate independently. Staff reported that patient is non-verbal and they have been assisting with all ADLs. PT recommending SNF.   Social Worker assessment / [name]: CSW spoke with patient's daughter regarding PT recommendation for SNF and discharge planning. Patient's daughter reported that patient has been at [facility] for approx. 2 years and that her preference is for patient to return to ALF but if she needs to go to SNF she would prefer [facility]. CSW and patient's daughter discussed payor sources and the difference between rehab and Isabella [name] term care. Patient's daughter reported that she is unaware if patient has Isabella [name] term care medicaid, noting that she pays 726 monthly to [facility] (which is the amount of the check patient receives). CSW agreed to follow up with [facility] regarding their ability to take patient back and to inquire about patient's  payor source for ALF.  CSW contacted [facility] and spoke with staff member Isabella [name], staff reported that patient may be able to return after receiving some ST rehab at [facility]. Staff reported that they patient has Isabella [name] term care medicaid for ALF.   CSW provided update to patient's daughter, patient's daughter agreeable to patient going to SNF for rehab or Isabella [name] term care.   CSW will complete FL2 and follow up with [facility].  Employment status:  Retired Health and safety inspector:  [place]icaid [place] PT Recommendations:  Skilled Nursing Facility Information / Referral to community resources:  Skilled Nursing Facility  Patient/Family's Response to care:  Patient's daughter appreciative of CSW assistance with discharge planning.   Patient/Family's Understanding of and Emotional Response to Diagnosis, Current Treatment, and Prognosis:  Patient disoriented and unable to participate in assessment. Patient's daughter involved in patient's care and reported that patient was independent until about 3 months ago. Patient's daughter agreeable with treatment [name] and verbalized [name] for patient to dc to SNF.   Emotional Assessment Appearance:    Attitude/Demeanor/Rapport:  Unable to Assess Affect (typically observed):  Unable to Assess Orientation:  (Disoriented x 4) Alcohol / Substance use:  Not Applicable Psych involvement (Current and /or in the community):  No (Comment)  Discharge Needs  Concerns to be addressed:  Care Coordination Readmission within the last 30 days:  No Current discharge risk:  Physical Impairment Barriers to Discharge:  Continued Medical Work up  Isabella PolesKimberly L Yocelyn Brocious, LCSW 11/05/2017, 4:36 PM

## 2017-11-06 ENCOUNTER — Inpatient Hospital Stay (HOSPITAL_COMMUNITY): Payer: Medicare Other

## 2017-11-06 DIAGNOSIS — J9600 Acute respiratory failure, unspecified whether with hypoxia or hypercapnia: Secondary | ICD-10-CM

## 2017-11-06 LAB — COMPREHENSIVE METABOLIC PANEL
ALBUMIN: 2.5 g/dL — AB (ref 3.5–5.0)
ALK PHOS: 63 U/L (ref 38–126)
ALT: 46 U/L (ref 14–54)
ANION GAP: 3 — AB (ref 5–15)
AST: 41 U/L (ref 15–41)
BUN: 9 mg/dL (ref 6–20)
CALCIUM: 8.2 mg/dL — AB (ref 8.9–10.3)
CO2: 25 mmol/L (ref 22–32)
Chloride: 113 mmol/L — ABNORMAL HIGH (ref 101–111)
Creatinine, Ser: 0.64 mg/dL (ref 0.44–1.00)
GFR calc non Af Amer: >60 mL/min (ref >60)
GLUCOSE: 114 mg/dL — AB (ref 65–99)
POTASSIUM: 4.7 mmol/L (ref 3.5–5.1)
Sodium: 141 mmol/L (ref 135–145)
TOTAL PROTEIN: 5.5 g/dL — AB (ref 6.5–8.1)
Total Bilirubin: 1.1 mg/dL (ref 0.3–1.2)

## 2017-11-06 LAB — CBC
HEMATOCRIT: 38 % (ref 36.0–46.0)
HEMOGLOBIN: 12.3 g/dL (ref 12.0–15.0)
MCH: 31 pg (ref 26.0–34.0)
MCHC: 32.4 g/dL (ref 30.0–36.0)
MCV: 95.7 fL (ref 78.0–100.0)
Platelets: 133 10*3/uL — ABNORMAL LOW (ref 150–400)
RBC: 3.97 MIL/uL (ref 3.87–5.11)
RDW: 13.8 % (ref 11.5–15.5)
WBC: 4.9 10*3/uL (ref 4.0–10.5)

## 2017-11-06 LAB — PHOSPHORUS: PHOSPHORUS: 3.1 mg/dL (ref 2.5–4.6)

## 2017-11-06 MED ORDER — ACETAMINOPHEN 500 MG PO TABS
500.0000 mg | ORAL_TABLET | Freq: Two times a day (BID) | ORAL | Status: DC
  Administered 2017-11-06 – 2017-11-08 (×5): 500 mg via ORAL
  Filled 2017-11-06 (×5): qty 1

## 2017-11-06 MED ORDER — SENNOSIDES-DOCUSATE SODIUM 8.6-50 MG PO TABS
1.0000 | ORAL_TABLET | Freq: Two times a day (BID) | ORAL | Status: DC
  Administered 2017-11-06 – 2017-11-08 (×4): 1 via ORAL
  Filled 2017-11-06 (×4): qty 1

## 2017-11-06 NOTE — Progress Notes (Signed)
PULMONARY / CRITICAL CARE MEDICINE   Name: Isabella SevinSarah [name] MRN: [id number] DOB: 01/28/1921    ADMISSION DATE:  11/02/2017 CONSULTATION DATE:  11/02/17   REFERRING MD:  [facility]  CHIEF COMPLAINT:  AMS/ dyspnea   HISTORY OF PRESENT ILLNESS:   [age] yobf NH resident, dnr status with severe dementia referred to er pm 2/3 wit FTT with poor po intake, worsening AMS x several days with initial w/u c/w large R PTX and severe hypernatemia and PCCM service asked to admit.  Daughter last saw mother 3 weeks pta and was able to recognize her and converse, feed herself but very inactive otherwise and there was no h/o R cp or cough that her daughter was aware of nor chest trauma  Of any kind.   SUBJECTIVE:  Up in chair No distress  VITAL SIGNS: BP 128/69 (BP Location: Left Arm)   Pulse 72   Temp 97.6 F (36.4 C) (Oral)   Resp 18   Ht 5' (1.524 m)   Wt 104 lb 15 oz (47.6 kg)   SpO2 100%   BMI 20.49 kg/m    HEMODYNAMICS:    VENTILATOR SETTINGS:    INTAKE / OUTPUT: I/O last 3 completed shifts: In: 3128.3 [P.O.:25; I.V.:3103.3] Out: 750 [Urine:750]  PHYSICAL EXAMINATION: General: pleasant confused no distress HEENT: NCAT no JVD  Pulmonary: clear   Cardiac: rrr Abdomen: soft not tender  Extremities: no edema  Neuro HOH, moves all ext. Confused at baseline  LABS:  BMET Recent Labs  Lab 11/04/17 0615 11/05/17 0943 11/06/17 0513  NA 152* 142 141  K 4.1 4.2 4.7  CL 120* 115* 113*  CO2 27 25 25   BUN 24* 16 9  CREATININE 0.81 0.73 0.64  GLUCOSE 126* 112* 114*    Electrolytes Recent Labs  Lab 11/04/17 0615 11/05/17 0943 11/06/17 0513  CALCIUM 8.6* 8.3* 8.2*  MG 2.1  --   --   PHOS 1.8*  --  3.1    CBC Recent Labs  Lab 11/02/17 1439 11/03/17 1406 11/06/17 0513  WBC 6.6 9.3 4.9  HGB 16.7* 13.5 12.3  HCT 52.4* 43.4 38.0  PLT 235 169 133*    Coag's No results for input(s): APTT, INR in the last 168 hours.  Sepsis Markers No results for input(s):  LATICACIDVEN, PROCALCITON, O2SATVEN in the last 168 hours.  ABG No results for input(s): PHART, PCO2ART, PO2ART in the last 168 hours.  Liver Enzymes Recent Labs  Lab 11/02/17 1439 11/06/17 0513  AST 45* 41  ALT 47 46  ALKPHOS 93 63  BILITOT 1.0 1.1  ALBUMIN 4.3 2.5*    Cardiac Enzymes No results for input(s): TROPONINI, PROBNP in the last 168 hours.  Glucose No results for input(s): GLUCAP in the last 168 hours.  Imaging No results found. SIGNIFICANT EVENTS:   LINES/TUBES: R chest tube 11/02/17 >>> 2/5  ASSESSMENT / PLAN: Severe hypernatremia in setting of volume depletion Severe underlying dementia/ non-verbal  Tension R PTX. Spontaneous -resolved 2/5 (chest tube removed); has small residual apical right PTX s/p removal   84 year old with spontaneous right pneumothorax with tension Status post chest tube.    Tension R PTX. Spontaneous -resolved 2/5 (chest tube removed); has small residual apical right PTX s/p removal PCXR review: smaller ptx < 5%.  Plan Wean oxygen  Dc sutures 2/12 F/u CXR again in am. If no change can be followed up in 5-7d after dc w/ PCP to assure no re-occurrence  Needs SNF  Simonne Martinet ACNP-BC [facility] Pulmonary/Critical Care Pager # [phone] OR # [phone] if no answer

## 2017-11-06 NOTE — [place] FL2 (Signed)
[place] MEDICAID FL2 LEVEL OF CARE SCREENING TOOL     IDENTIFICATION  Patient Name: Isabella SevinSarah Baik Birthdate: 09/23/1921 Sex: female Admission Date (Current Location): 11/02/2017  [facility] [place]   Facility and Address:  [facility],  [address], [place] [address]      Provider Number: [id number]  Attending Physician Name and Address:  Albertine GratesXu, Fang, MD  Relative Name and Phone Number:       Current Level of Care: Hospital Recommended Level of Care: Skilled Nursing Facility Prior Approval Number:    Date Approved/Denied:   PASRR Number: [id number]  Discharge Plan: SNF    Current Diagnoses: Patient Active Problem List   Diagnosis Date Noted  . Pneumothorax on right 11/02/2017  . Hypernatremia 11/02/2017  . CAP (community acquired pneumonia) 09/29/2016  . Post-ictal state (HCC)   . Altered mental status 09/27/2016  . HTN (hypertension) 09/27/2016  . Seizures (HCC) 09/27/2016  . Dementia 04/21/2015    Orientation RESPIRATION BLADDER Height & Weight     (Disoriented x 4)  Normal Incontinent Weight: 104 lb 15 oz (47.6 kg) Height:  5' (152.4 cm)  BEHAVIORAL SYMPTOMS/MOOD NEUROLOGICAL BOWEL NUTRITION STATUS        Diet(Soft)  AMBULATORY STATUS COMMUNICATION OF NEEDS Skin   Extensive Assist Non-Verbally Other (Comment)( Incision(Closed)02/06/19RibRight Dressing Type: Gauze)                       Personal Care Assistance Level of Assistance  Bathing, Feeding, Dressing Bathing Assistance: Maximum assistance Feeding assistance: Maximum assistance Dressing Assistance: Maximum assistance     Functional Limitations Info  Sight, Hearing, Speech Sight Info: Impaired Hearing Info: Impaired      SPECIAL CARE FACTORS FREQUENCY  PT (By licensed PT), OT (By licensed OT)     PT Frequency: 5x/week OT Frequency: 5x/week            Contractures Contractures Info: Not present    Additional Factors Info  Code Status,  Allergies, Isolation Precautions Code Status Info: DNR Allergies Info: NKA     Isolation Precautions Info: Isolation: Contact Precautions   Infection:MRSA     Current Medications (11/06/2017):  This is the current hospital active medication list Current Facility-Administered Medications  Medication Dose Route Frequency Provider Last Rate Last Dose  . acetaminophen (TYLENOL) tablet 500 mg  500 mg Oral BID Albertine GratesXu, Fang, MD      . Chlorhexidine Gluconate Cloth 2 % PADS 6 each  6 each Topical Q0600 Nyoka CowdenWert, Michael B, MD   6 each at 11/06/17 [id number]  . levETIRAcetam (KEPPRA) tablet 250 mg  250 mg Oral QHS Nyoka CowdenWert, Michael B, MD   250 mg at 11/05/17 2252  . levETIRAcetam (KEPPRA) tablet 500 mg  500 mg Oral QAC breakfast Nyoka CowdenWert, Michael B, MD   500 mg at 11/05/17 1159  . mirtazapine (REMERON SOL-TAB) disintegrating tablet 7.5 mg  7.5 mg Oral QHS Nyoka CowdenWert, Michael B, MD   7.5 mg at 11/05/17 2251  . mupirocin ointment (BACTROBAN) 2 % 1 application  1 application Nasal BID Nyoka CowdenWert, Michael B, MD   1 application at 11/06/17 [phone]  . risperiDONE (RISPERDAL M-TABS) disintegrating tablet 0.5 mg  0.5 mg Oral QHS Nyoka CowdenWert, Michael B, MD   0.5 mg at 11/05/17 2251     Discharge Medications: Please see discharge summary for a list of discharge medications.  Relevant Imaging Results:  Relevant Lab Results:   Additional Information SSN [id number]  Antionette PolesKimberly L Erminia Mcnew, LCSW

## 2017-11-06 NOTE — Progress Notes (Signed)
Chest tube removal site continues to leak. Dressing was changed at bedtime. Will continue to monitor dressing.

## 2017-11-06 NOTE — Progress Notes (Signed)
PROGRESS NOTE  Isabella [name] BMW:[id number] DOB: 1920-10-11 DOA: 11/02/2017 PCP: Mortimer Fries, PA  HPI/Recap of past 24 hours:  Chest tube removal site continues to leak  Sleepy this am, c/o back pain when awake Awake and alert, demented, only oriented to self  Assessment/Plan: Active Problems:   Pneumothorax on right   Hypernatremia  Spontaneous tension pneumothorax -She was admitted to icu on admission. S/p chest tube and chest tube removed, she is transferred to hospitalist service on 2/6 -Per critical care "Stable pneumothorax noted post chest tube removal. No indication for repeat chest tube." will follow critical care recommendations  Hyponatremia secondary to dehydration Sodium 164 on presentation She received  D5W, sodium 141 ,d/c ivf, encourage oral intake Thought doubt patient will eat enough , palliative care consulted for goals of care.  Hypophosphatemia: replaced phos, normalized.  AKI ON CKDII: BUN 67/CR 1.4 ON PRESENTATION ua concentrated with ketone , but no infection. Normalized with hydration, bun 9/cr 0.64 today. D/c ivf, repeat lab in am.  H/o seizure: continue home meds. keppra  Dementia/FTT: -Continue home meds risperidone, prn ativan -Daughter last saw mother 3 weeks ago prior to this hospitalization, per daughter patient was able to recognize her and converse, feed herself but very inactive otherwise  -Per daughter patient has  weight loss (per chart review , patient weighed 120 in 02/2017, currently she weight 103 pounds) -Discussed with daughter at bedside about plan of care for this hospitalization, confirmed DNR status, discussed feeding tube, snf placement Palliative care consulted   MRSA colonization: contact precaution, decolonization.  Code Status: DNR  Family Communication: patient   Disposition Plan:  SNF with palliative care following    Consultants:  Critical care  Procedures:  Chest tube placement and  removal  Antibiotics:  none   Objective: BP 128/69 (BP Location: Left Arm)   Pulse 72   Temp 97.6 F (36.4 C) (Oral)   Resp 18   Ht 5' (1.524 m)   Wt 47.6 kg (104 lb 15 oz)   SpO2 100%   BMI 20.49 kg/m   Intake/Output Summary (Last 24 hours) at 11/06/2017 0910 Last data filed at 11/06/2017 0700 Gross per 24 hour  Intake 1923.33 ml  Output 400 ml  Net 1523.33 ml   Filed Weights   11/02/17 1738 11/05/17 0616 11/06/17 0423  Weight: 43.9 kg (96 lb 12.5 oz) 47 kg (103 lb 9.9 oz) 47.6 kg (104 lb 15 oz)    Exam: Patient is examined daily including today on 11/06/2017, exams remain the same as of yesterday except that has changed    General:  Frail, thin, demented, alert, taking, oriented to self only  Cardiovascular: RRR  Respiratory: CTABL  Abdomen: Soft/ND/NT, positive BS  Musculoskeletal: No Edema  Neuro: alert, oriented to self only  Data Reviewed: Basic Metabolic Panel: Recent Labs  Lab 11/02/17 1439 11/03/17 0500 11/04/17 0615 11/05/17 0943 11/06/17 0513  NA 164* 160* 152* 142 141  K 3.5 3.6 4.1 4.2 4.7  CL 127* 129* 120* 115* 113*  CO2 25 27 27 25 25   GLUCOSE 110* 152* 126* 112* 114*  BUN 67* 47* 24* 16 9  CREATININE 1.40* 1.07* 0.81 0.73 0.64  CALCIUM 10.1 8.9 8.6* 8.3* 8.2*  MG  --   --  2.1  --   --   PHOS  --   --  1.8*  --  3.1   Liver Function Tests: Recent Labs  Lab 11/02/17 1439 11/06/17 0513  AST 45* 41  ALT 47 46  ALKPHOS 93 63  BILITOT 1.0 1.1  PROT 8.9* 5.5*  ALBUMIN 4.3 2.5*   No results for input(s): LIPASE, AMYLASE in the last 168 hours. No results for input(s): AMMONIA in the last 168 hours. CBC: Recent Labs  Lab 11/02/17 1439 11/03/17 1406 11/06/17 0513  WBC 6.6 9.3 4.9  NEUTROABS 5.1  --   --   HGB 16.7* 13.5 12.3  HCT 52.4* 43.4 38.0  MCV 100.2* 99.3 95.7  PLT 235 169 133*   Cardiac Enzymes:   No results for input(s): CKTOTAL, CKMB, CKMBINDEX, TROPONINI in the last 168 hours. BNP (last 3 results) No results  for input(s): BNP in the last 8760 hours.  ProBNP (last 3 results) No results for input(s): PROBNP in the last 8760 hours.  CBG: No results for input(s): GLUCAP in the last 168 hours.  Recent Results (from the past 240 hour(s))  Urine Culture     Status: None   Collection Time: 11/02/17  3:00 PM  Result Value Ref Range Status   Specimen Description   Final    URINE, CLEAN CATCH Performed at [facility], [address] [place], [place] [address]    Special Requests   Final    NONE Performed at [facility], [address] [place], [place] [address]    Culture   Final    NO GROWTH Performed at [facility], [address] [place], [place] [address]    Report Status 11/04/2017 FINAL  Final  MRSA PCR Screening     Status: Abnormal   Collection Time: 11/02/17  8:42 PM  Result Value Ref Range Status   MRSA by PCR POSITIVE (A) NEGATIVE Final    Comment:        The GeneXpert MRSA Assay (FDA approved for NASAL specimens only), is one component of a comprehensive MRSA colonization surveillance program. It is not intended to diagnose MRSA infection nor to guide or monitor treatment for MRSA infections. RESULT CALLED TO, READ BACK BY AND VERIFIED WITH: H NJAJA,RN @0006  11/03/17 MKELLY 11/03/17 Performed at [facility], [address] [place], [place] [address]      Studies: No results found.  Scheduled Meds: . acetaminophen  500 mg Oral BID  . Chlorhexidine Gluconate Cloth  6 each Topical Q0600  . levETIRAcetam  250 mg Oral QHS  . levETIRAcetam  500 mg Oral QAC breakfast  . mirtazapine  7.5 mg Oral QHS  . mupirocin ointment  1 application Nasal BID  . risperiDONE  0.5 mg Oral QHS    Continuous Infusions: . dextrose 5 % with KCl 20 mEq / L 20 mEq (11/06/17 0831)     Time spent: 25 mins I have personally reviewed and interpreted on  11/06/2017 daily labs,   I reviewed all nursing notes,  consultant  notes,  vitals,   I have discussed plan of care as described above with RN , patient on 11/06/2017   Albertine GratesFang Arizona Sorn MD, PhD  [facility] Pager [phone]. If 7PM-7AM, please contact night-coverage at [email or web address], password [email or web address] 11/06/2017, 9:10 AM  LOS: 4 days

## 2017-11-06 NOTE — Progress Notes (Signed)
Assumed care of patient at 1530, I agree with the previous nurses assessment. 

## 2017-11-06 NOTE — Care Management Important Message (Addendum)
Important Message  Patient Details IM Letter given to Cookie/Case Manager to present to the Patient Name: Isabella SevinSarah [name] MRN: [id number] Date of Birth: 01/19/1921   Medicare Important Message Given:  Yes    Caren MacadamFuller, Abiha Lukehart 11/06/2017, 11:07 AMImportant Message  Patient Details  Name: Isabella SevinSarah [name] MRN: [id number] Date of Birth: 10/02/1920   Medicare Important Message Given:  Yes    Caren MacadamFuller, Jamier Urbas 11/06/2017, 11:07 AM

## 2017-11-07 ENCOUNTER — Inpatient Hospital Stay (HOSPITAL_COMMUNITY): Payer: Medicare Other

## 2017-11-07 DIAGNOSIS — F0150 Vascular dementia without behavioral disturbance: Secondary | ICD-10-CM

## 2017-11-07 DIAGNOSIS — Z4682 Encounter for fitting and adjustment of non-vascular catheter: Secondary | ICD-10-CM

## 2017-11-07 DIAGNOSIS — Z515 Encounter for palliative care: Secondary | ICD-10-CM

## 2017-11-07 LAB — CBC WITH DIFFERENTIAL/PLATELET
BASOS PCT: 0 %
Basophils Absolute: 0 10*3/uL (ref 0.0–0.1)
EOS ABS: 0.2 10*3/uL (ref 0.0–0.7)
Eosinophils Relative: 6 %
HEMATOCRIT: 39.5 % (ref 36.0–46.0)
HEMOGLOBIN: 12.8 g/dL (ref 12.0–15.0)
LYMPHS ABS: 1.2 10*3/uL (ref 0.7–4.0)
Lymphocytes Relative: 31 %
MCH: 30.9 pg (ref 26.0–34.0)
MCHC: 32.4 g/dL (ref 30.0–36.0)
MCV: 95.4 fL (ref 78.0–100.0)
MONO ABS: 0.4 10*3/uL (ref 0.1–1.0)
MONOS PCT: 10 %
NEUTROS ABS: 2.1 10*3/uL (ref 1.7–7.7)
NEUTROS PCT: 53 %
Platelets: 154 10*3/uL (ref 150–400)
RBC: 4.14 MIL/uL (ref 3.87–5.11)
RDW: 13.8 % (ref 11.5–15.5)
WBC: 3.8 10*3/uL — ABNORMAL LOW (ref 4.0–10.5)

## 2017-11-07 LAB — BASIC METABOLIC PANEL
Anion gap: 6 (ref 5–15)
BUN: 11 mg/dL (ref 6–20)
CALCIUM: 8.4 mg/dL — AB (ref 8.9–10.3)
CHLORIDE: 110 mmol/L (ref 101–111)
CO2: 25 mmol/L (ref 22–32)
CREATININE: 0.73 mg/dL (ref 0.44–1.00)
GFR calc Af Amer: >60 mL/min (ref >60)
GFR calc non Af Amer: >60 mL/min (ref >60)
Glucose, Bld: 80 mg/dL (ref 65–99)
Potassium: 3.4 mmol/L — ABNORMAL LOW (ref 3.5–5.1)
Sodium: 141 mmol/L (ref 135–145)

## 2017-11-07 LAB — MAGNESIUM: Magnesium: 1.8 mg/dL (ref 1.7–2.4)

## 2017-11-07 MED ORDER — MAGNESIUM SULFATE 2 GM/50ML IV SOLN
2.0000 g | Freq: Once | INTRAVENOUS | Status: AC
Start: 2017-11-07 — End: 2017-11-07
  Administered 2017-11-07: 2 g via INTRAVENOUS
  Filled 2017-11-07: qty 50

## 2017-11-07 MED ORDER — POTASSIUM CHLORIDE 20 MEQ/15ML (10%) PO SOLN
40.0000 meq | Freq: Once | ORAL | Status: AC
  Administered 2017-11-07: 40 meq via ORAL
  Filled 2017-11-07: qty 30

## 2017-11-07 MED ORDER — MAGNESIUM OXIDE 400 (241.3 MG) MG PO TABS
400.0000 mg | ORAL_TABLET | Freq: Every day | ORAL | Status: DC
  Administered 2017-11-08: 400 mg via ORAL
  Filled 2017-11-07: qty 1

## 2017-11-07 NOTE — Progress Notes (Signed)
PROGRESS NOTE  Isabella [name] ZOX:[id number] DOB: [birth year] DOA: 11/02/2017 PCP: Mortimer Fries, PA  HPI/Recap of past 24 hours:  Awake and alert, demented, only oriented to self  Assessment/Plan: Active Problems:   Pneumothorax on right   Hypernatremia   Acute respiratory failure (HCC)  Spontaneous tension pneumothorax -She was admitted to icu on admission. S/p chest tube and chest tube removed, she is transferred to hospitalist service on 2/6 -Per critical care "Stable pneumothorax noted post chest tube removal. No indication for repeat chest tube." will follow critical care recommendations  Hyponatremia secondary to dehydration -Sodium 164 on presentation -She received  D5W, sodium 141 ,d/c ivf, encourage oral intake -Thought doubt patient will eat enough , palliative care consulted for goals of care. "DNR in the event of cardiac or pulmonary arrest. Limited interventions - readmit to the hospital but not ICU, DNI Antibiotics OK IVFs for a defined trial period NO FEEDING TUBE"   Hypophosphatemia/hypokalemia/hypomagnesemia: replaced, repeat lab in am.  AKI ON CKDII: BUN 67/CR 1.4 ON PRESENTATION ua concentrated with ketone , but no infection. Normalized with hydration  H/o seizure: continue home meds. keppra  Dementia/FTT: -Continue home meds risperidone, prn ativan -Daughter last saw mother 3 weeks ago prior to this hospitalization, per daughter patient was able to recognize her and converse, feed herself but very inactive otherwise  -Per daughter patient has  weight loss (per chart review , patient weighed 120 in 02/2017, currently she weight 103 pounds) -Discussed with daughter at bedside about plan of care for this hospitalization, confirmed DNR status, discussed feeding tube, snf placement -Dysphagia 1 with thin liquids /Medication Administration: Crushed with puree  Palliative care consulted, input appreciated "DNR in the event of cardiac or pulmonary arrest. Limited  interventions - readmit to the hospital but not ICU, DNI Antibiotics OK IVFs for a defined trial period NO FEEDING TUBE"   MRSA colonization: contact precaution, decolonization.  Code Status: DNR  Family Communication: patient   Disposition Plan:  SNF with palliative care following    Consultants:  Critical care  Palliative care  Procedures:  Chest tube placement and removal  Antibiotics:  none   Objective: BP (!) 107/55 (BP Location: Left Arm)   Pulse 69   Temp 98.2 F (36.8 C) (Oral)   Resp 16   Ht 5' (1.524 m)   Wt 47.6 kg (104 lb 15 oz)   SpO2 98%   BMI 20.49 kg/m   Intake/Output Summary (Last 24 hours) at 11/07/2017 1221 Last data filed at 11/07/2017 0900 Gross per 24 hour  Intake 347 ml  Output 300 ml  Net 47 ml   Filed Weights   11/02/17 1738 11/05/17 0616 11/06/17 0423  Weight: 43.9 kg (96 lb 12.5 oz) 47 kg (103 lb 9.9 oz) 47.6 kg (104 lb 15 oz)    Exam: Patient is examined daily including today on 11/07/2017, exams remain the same as of yesterday except that has changed    General:  Frail, thin, demented, alert, taking, oriented to self only  Cardiovascular: RRR  Respiratory: CTABL  Abdomen: Soft/ND/NT, positive BS  Musculoskeletal: No Edema  Neuro: alert, oriented to self only  Data Reviewed: Basic Metabolic Panel: Recent Labs  Lab 11/03/17 0500 11/04/17 0615 11/05/17 0943 11/06/17 0513 11/07/17 0505  NA 160* 152* 142 141 141  K 3.6 4.1 4.2 4.7 3.4*  CL 129* 120* 115* 113* 110  CO2 27 27 25 25 25   GLUCOSE 152* 126* 112* 114* 80  BUN 47* 24* 16  9 11  CREATININE 1.07* 0.81 0.73 0.64 0.73  CALCIUM 8.9 8.6* 8.3* 8.2* 8.4*  MG  --  2.1  --   --  1.8  PHOS  --  1.8*  --  3.1  --    Liver Function Tests: Recent Labs  Lab 11/02/17 1439 11/06/17 0513  AST 45* 41  ALT 47 46  ALKPHOS 93 63  BILITOT 1.0 1.1  PROT 8.9* 5.5*  ALBUMIN 4.3 2.5*   No results for input(s): LIPASE, AMYLASE in the last 168 hours. No results for  input(s): AMMONIA in the last 168 hours. CBC: Recent Labs  Lab 11/02/17 1439 11/03/17 1406 11/06/17 0513 11/07/17 0505  WBC 6.6 9.3 4.9 3.8*  NEUTROABS 5.1  --   --  2.1  HGB 16.7* 13.5 12.3 12.8  HCT 52.4* 43.4 38.0 39.5  MCV 100.2* 99.3 95.7 95.4  PLT 235 169 133* 154   Cardiac Enzymes:   No results for input(s): CKTOTAL, CKMB, CKMBINDEX, TROPONINI in the last 168 hours. BNP (last 3 results) No results for input(s): BNP in the last 8760 hours.  ProBNP (last 3 results) No results for input(s): PROBNP in the last 8760 hours.  CBG: No results for input(s): GLUCAP in the last 168 hours.  Recent Results (from the past 240 hour(s))  Urine Culture     Status: None   Collection Time: 11/02/17  3:00 PM  Result Value Ref Range Status   Specimen Description   Final    URINE, CLEAN CATCH Performed at [facility], [address] [place], [place] [address]    Special Requests   Final    NONE Performed at [facility], [address] [place], [place] [address]    Culture   Final    NO GROWTH Performed at [facility], [address] [place], [place] [address]    Report Status 11/04/2017 FINAL  Final  MRSA PCR Screening     Status: Abnormal   Collection Time: 11/02/17  8:42 PM  Result Value Ref Range Status   MRSA by PCR POSITIVE (A) NEGATIVE Final    Comment:        The GeneXpert MRSA Assay (FDA approved for NASAL specimens only), is one component of a comprehensive MRSA colonization surveillance program. It is not intended to diagnose MRSA infection nor to guide or monitor treatment for MRSA infections. RESULT CALLED TO, READ BACK BY AND VERIFIED WITH: H NJAJA,RN @0006  11/03/17 MKELLY 11/03/17 Performed at [facility], [address] [place], [place] [address]      Studies: Dg Chest Port 1 View  Result Date: 11/07/2017 CLINICAL DATA:  Follow-up pneumothorax EXAM: PORTABLE CHEST 1 VIEW COMPARISON:   11/06/2017 FINDINGS: Cardiac shadow is stable. Thoracic aorta is mildly tortuous with calcifications. The lungs are well aerated bilaterally. The previously seen right pneumothorax is stable in appearance. No new focal abnormality is noted. Old rib fractures are seen on the left. Improved aeration in the left base is noted when compared with the previous exam. IMPRESSION: Stable right pneumothorax. Improved aeration in the left base. Electronically Signed   By: Alcide CleverMark  Lukens M.D.   On: 11/07/2017 07:41   Dg Chest Port 1 View  Result Date: 11/06/2017 CLINICAL DATA:  Right-sided pneumothorax EXAM: PORTABLE CHEST 1 VIEW COMPARISON:  November 05, 2017 FINDINGS: Right apical pneumothorax is stable.  No tension component. There is patchy airspace consolidation in the left base, new. Minimal left pleural effusion. Heart  is upper normal in size with pulmonary vascularity normal. No adenopathy. There is aortic atherosclerosis. No bone lesions. IMPRESSION: 1.  Stable small right apical pneumothorax.  No tension component. 2.  New left base infiltrate with small left pleural effusion. 3.  Stable cardiac silhouette.  Aortic atherosclerosis present. Aortic Atherosclerosis (ICD10-I70.0). Electronically Signed   By: Bretta Bang III M.D.   On: 11/06/2017 13:54    Scheduled Meds: . acetaminophen  500 mg Oral BID  . levETIRAcetam  250 mg Oral QHS  . levETIRAcetam  500 mg Oral QAC breakfast  . [START ON 11/08/2017] magnesium oxide  400 mg Oral Daily  . mirtazapine  7.5 mg Oral QHS  . risperiDONE  0.5 mg Oral QHS  . senna-docusate  1 tablet Oral BID    Continuous Infusions:    Time spent: 25 mins, case discussed with palliative care and social worker I have personally reviewed and interpreted on  11/07/2017 daily labs,   I reviewed all nursing notes,  consultant notes,  vitals,   I have discussed plan of care as described above with RN , patient on 11/07/2017   Albertine Grates MD, PhD  [facility] Pager  [phone]. If 7PM-7AM, please contact night-coverage at [email or web address], password [facility] 11/07/2017, 12:21 PM  LOS: 5 days

## 2017-11-07 NOTE — Progress Notes (Addendum)
No charge note  PMT meeting scheduled with daughter for 3 PM.  After long conversation on the phone - dtr seems intent on rehab.  States her mother was able to RUN - not just walk at ALF. Code Status is DNR.  Will not be a hospice candidate until GOC change.  Patient's sister just passed at 82 yo.  Will talk with daughter about progressive dementia and recurrent dehydration today at 3 pm.  Norvel RichardsMarianne Phong Isenberg, PA-C Palliative Medicine Pager: [phone]

## 2017-11-07 NOTE — Consult Note (Signed)
Consultation Note Date: 11/07/2017   Patient Name: Isabella [name]  DOB: [birth year]  MRN: [id number]  Age / Sex: 84 y.o., female  PCP: Isabella [name], [place] Referring Physician: Florencia Reasons, MD  Reason for Consultation: Establishing goals of care  HPI/Patient Profile: 84 y.o. female  with past medical history of vascular dementia, CKD, Seizure, weight loss, and a remote history of TB, who was admitted on 11/02/2017 with large right spontaneous pneumothorax with tension and hypernatremia (Na 160+).  A chest tube was placed and the pneumothorax has now virtually resolved.  Chest tube has been removed.  Clinical Assessment and Goals of Care:  I have reviewed medical records including EPIC notes, labs and imaging, received report from Dr. Erlinda [name], assessed the patient and then met at the bedside along with her daughter Isabella [name] to discuss diagnosis prognosis, Isabella [name], EOL wishes, disposition and options.  I introduced Palliative Medicine as specialized medical care for people living with serious illness. It focuses on providing relief from the symptoms and stress of a serious illness. The goal is to improve quality of life for both the patient and the family.  We discussed her mother's functional and nutritional status - per [facility] the patient was able to walk and sometimes even run at the facility.  Isabella [name] states the patient eats well when fed.  I shared with Isabella [name] that her mother has lost 24 lbs since June 2018 - this was quite surprising to her.  We talked at length about EOL with dementia, natural dehydration, eventually possible aspiration.  Isabella [name] confided that her father had had a feeding tube for the last year of his life and it was not the best experience for him.  She would not like for her mother to have a feeding tube - particularly not if she is able to take oral nutrition.  We discussed her current illness and what it  means in the larger context of her on-going co-morbidities.  Natural disease trajectory and expectations at EOL were discussed.  Dr. Vaughan [name] stopped by during our visit.  I asked about what may have caused the pneumothorax - Dr. Vaughan [name] felt that it could have been related to a fall.  He asked if the patient had ever had pulmonary illness in the past.  Isabella [name] reported her mother had TB when Isabella [name] was a child.   Dr. Vaughan [name] indicated that TB could have weakened the lungs and contributed to the pneumothorax.  He also explained that it was more likely for the patient to have another pneumothorax since she has already had the first.  I attempted to elicit values and goals of care important to the patient.  Isabella [name] and I filled out a MOST form.  Isabella [name] choose:  DNR in the event of cardiac or pulmonary arrest. Limited interventions - readmit to the hospital but not ICU, DNI Antibiotics OK IVFs for a defined trial period NO FEEDING TUBE  Hospice and Palliative Care services outpatient were explained and offered.  At this time Isabella [name] wishes to pursue SNF rehab  for her mother.  Questions and concerns were addressed.  Hard Choices booklet left for review. The family was encouraged to call with questions or concerns.    Primary Decision Maker:  NEXT OF KIN Isabella [name] Daughter    SUMMARY OF RECOMMENDATIONS   Isabella [name] and I filled out a MOST form.  Isabella [name] choose:  DNR in the event of cardiac or pulmonary arrest. Limited interventions - readmit to the hospital but not ICU, DNI Antibiotics OK IVFs for a defined trial period NO FEEDING TUBE  D/C to SNF for acute Rehab.  The hope is that she will eventually return to her [place] after rehab.  Code Status/Advance Care Planning:  DNR  Palliative Prophylaxis:   Aspiration and Delirium Protocol  Prognosis:  Likely less than 6 months - advanced dementia, eating minimal amounts, multiple falls, admitted with severe dehydration.  She has  lost 24 lbs (03/10/17 compared to 11/02/17).  Discharge Planning: [place] for rehab with Palliative care service follow-up      Primary Diagnoses: Present on Admission: . Pneumothorax on right . Hypernatremia   I have reviewed the medical record, interviewed the patient and family, and examined the patient. The following aspects are pertinent.  Past Medical History:  Diagnosis Date  . Dementia   . Hearing loss   . Hypertension   . Seizures (Isabella [name])   . Vascular dementia   . Weight loss    Social History   Socioeconomic History  . Marital status: Widowed    Spouse name: None  . Number of children: None  . Years of education: None  . Highest education level: None  Social Needs  . Financial resource strain: None  . Food insecurity - worry: None  . Food insecurity - inability: None  . Transportation needs - medical: None  . Transportation needs - non-medical: None  Occupational History  . None  Tobacco Use  . Smoking status: Never Smoker  . Smokeless tobacco: Never Used  Substance and Sexual Activity  . Alcohol use: No  . Drug use: No  . Sexual activity: None  Other Topics Concern  . None  Social History Narrative  . None   Family History  Family history unknown: Yes   Scheduled Meds: . acetaminophen  500 mg Oral BID  . levETIRAcetam  250 mg Oral QHS  . levETIRAcetam  500 mg Oral QAC breakfast  . [START ON 11/08/2017] magnesium oxide  400 mg Oral Daily  . mirtazapine  7.5 mg Oral QHS  . risperiDONE  0.5 mg Oral QHS  . senna-docusate  1 tablet Oral BID   Continuous Infusions: PRN Meds:. No Known Allergies Review of Systems pleasantly demented unable to provide.  Physical Exam  Frail elderly female, speaks unintelligibly, unable to hear me, awake, alert, eats applesauce when I feed her. cv rrr brady no frank m/r/g Resp no distress Abdomen soft, nt, nd Neuro - orientated to self "I am Isabella [name]".  Unable to follow commands likely due to hearing  loss.  Vital Signs: BP 109/72 (BP Location: Right Arm)   Pulse 62   Temp 98.5 F (36.9 C) (Oral)   Resp 16   Ht 5' (1.524 m)   Wt 47.6 kg (104 lb 15 oz)   SpO2 97%   BMI 20.49 kg/m  Pain Assessment: PAINAD POSS *See Group Information*: S-Acceptable,Sleep, easy to arouse Pain Score: 0-No pain   SpO2: SpO2: 97 % O2 Device:SpO2: 97 % O2 Flow Rate: .O2 Flow Rate (L/min):  2 L/min  IO: Intake/output summary:   Intake/Output Summary (Last 24 hours) at 11/07/2017 1431 Last data filed at 11/07/2017 0900 Gross per 24 hour  Intake 110 ml  Output 300 ml  Net -190 ml    LBM: Last BM Date: (unable to assess, hx of dementia) Baseline Weight: Weight: 43.9 kg (96 lb 12.5 oz) Most recent weight: Weight: 47.6 kg (104 lb 15 oz)     Palliative Assessment/Data: 20-30%     Time In: 2:45  Time Out: 3:47 Time Total: 62 min. Greater than 50%  of this time was spent counseling and coordinating care related to the above assessment and plan.  Signed by: Florentina Jenny, PA-C Palliative Medicine Pager: [phone]  Please contact Palliative Medicine Team phone at [phone] for questions and concerns.  For individual provider: See Shea Evans

## 2017-11-07 NOTE — Clinical Social Work Placement (Signed)
   CLINICAL SOCIAL WORK PLACEMENT  NOTE  Date:  11/07/2017  Patient Details  Name: Rudolpho SevinSarah Mohamed MRN: [id number] Date of Birth: 04/18/1921  Clinical Social Work is seeking post-discharge placement for this patient at the Skilled  Nursing Facility level of care (*CSW will initial, date and re-position this form in  chart as items are completed):  Yes   Patient/family provided with [facility] Clinical Social Work Department's list of facilities offering this level of care within the geographic area requested by the patient (or if unable, by the patient's family).  Yes   Patient/family informed of their freedom to choose among providers that offer the needed level of care, that participate in Medicare, Medicaid or managed care program needed by the patient, have an available bed and are willing to accept the patient.  Yes   Patient/family informed of [facility]'s ownership interest in [facility] and [facility], as well as of the fact that they are under no obligation to receive care at these facilities.  PASRR submitted to EDS on 11/06/17     PASRR number received on 11/06/17     Existing PASRR number confirmed on       FL2 transmitted to all facilities in geographic area requested by pt/family on 11/06/17     FL2 transmitted to all facilities within larger geographic area on       Patient informed that his/her managed care company has contracts with or will negotiate with certain facilities, including the following:        Yes   Patient/family informed of bed offers received.  Patient chooses bed at       Physician recommends and patient chooses bed at      Patient to be transferred to   on  .  Patient to be transferred to facility by       Patient family notified on   of transfer.  Name of family member notified:        PHYSICIAN       Additional Comment:    _______________________________________________ Antionette PolesKimberly L Joel [place], LCSW 11/07/2017, 4:24 PM

## 2017-11-07 NOTE — Evaluation (Signed)
Clinical/Bedside Swallow Evaluation Patient Details  Name: Myeasha Ballowe MRN: [id number] Date of Birth: 05/01/1921  Today's Date: 11/07/2017 Time: SLP Start Time (ACUTE ONLY): 7829 SLP Stop Time (ACUTE ONLY): 0853 SLP Time Calculation (min) (ACUTE ONLY): 18 min  Past Medical History:  Past Medical History:  Diagnosis Date  . Dementia   . Hearing loss   . Hypertension   . Seizures (HCC)   . Vascular dementia   . Weight loss    Past Surgical History: No past surgical history on file. HPI:  35 yobf NH resident, dnr status with severe dementia referred to er pm 2/3 wit FTT with poor po intake, worsening AMS x several days with initial w/u c/w large R PTX and severe hypernatemia and PCCM service asked to admit.  Daughter last saw mother 3 weeks pta and was able to recognize her and converse, feed herself but very inactive otherwise and there was no h/o R cp or cough that her daughter was aware of nor chest trauma of any kind.  She was fuond to have a pneumothorax and is s/p chest tube placement which has since been removed.  Most recent chest xray is showing stable right pneumothorax with improved aeration in the left base.     Assessment / Plan / Recommendation Clinical Impression  Clinical swallowing evaluation was completed using thin liquids via spoon and straw, pureed material and soft solids.  The patient presented with a moderate oral dysphagia that is most likely cognitive based.  Oral transit across all textures was delayed and appeared to be due to oral holding.  Extremely long duration of mastication was observed for soft solids (i.e. eggs from breakfast tray) with oral residue even following liquid wash.  Nursing endorses extremely long chewing given soft solids and that the patient appeared to do better with pureed material.  Hyo-laryngeal excursion appreciated to palpation.  Overt s/s of aspiration were not seen, however, the patient is an aspiration risk due to her mentation.  Suspect  issues are mostly baseline.  Recommend downgrade diet to dysphagia 1 with thin liquids.  Feed slowly and check mouth for pocketing frequently.  Medication should be crushed as allowed and placed in pureed material.  ST will follow briefly for therapeutic diet tolerance, possible diet advancement if mentation clears some and education.  Patient may benefit from nutrition consult, if not already on board, to maximize caloric intake.   SLP Visit Diagnosis: Dysphagia, oral phase (R13.11)    Aspiration Risk  Mild aspiration risk    Diet Recommendation  Dysphagia 1 with thin liquids   Medication Administration: Crushed with puree    Other  Recommendations Oral Care Recommendations: Oral care before and after PO   Follow up Recommendations Other (comment)(TBD)      Frequency and Duration min 2x/week  2 weeks       Prognosis Prognosis for Safe Diet Advancement: Fair Barriers/Prognosis Comment: Cognitive deficits      Swallow Study   General Date of Onset: 11/02/17 HPI: 73 yobf NH resident, dnr status with severe dementia referred to er pm 2/3 wit FTT with poor po intake, worsening AMS x several days with initial w/u c/w large R PTX and severe hypernatemia and PCCM service asked to admit.  Daughter last saw mother 3 weeks pta and was able to recognize her and converse, feed herself but very inactive otherwise and there was no h/o R cp or cough that her daughter was aware of nor chest trauma of any kind.  She was fuond to have a pneumothorax and is s/p chest tube placement which has since been removed.  Most recent chest xray is showing stable right pneumothorax with improved aeration in the left base.   Type of Study: Bedside Swallow Evaluation Previous Swallow Assessment: [facility]. Diet Prior to this Study: Dysphagia 3 (soft);Thin liquids Temperature Spikes Noted: No History of Recent Intubation: No Behavior/Cognition: Alert;Confused;Doesn't follow directions Oral Cavity  Assessment: Other (comment)(Unable to visualize.) Oral Care Completed by SLP: No Oral Cavity - Dentition: Edentulous Vision: Functional for self-feeding Self-Feeding Abilities: Total assist Patient Positioning: Upright in bed Baseline Vocal Quality: Normal Volitional Cough: Cognitively unable to elicit Volitional Swallow: Unable to elicit    Oral/Motor/Sensory Function Overall Oral Motor/Sensory Function: Other (comment)(Unable to assess.)   Ice Chips Ice chips: Not tested   Thin Liquid Thin Liquid: Impaired Presentation: Spoon;Straw Oral Phase Impairments: Reduced lingual movement/coordination;Reduced labial seal Oral Phase Functional Implications: Prolonged oral transit;Oral holding    Nectar Thick Nectar Thick Liquid: Not tested   Honey Thick Honey Thick Liquid: Not tested   Puree Puree: Impaired Presentation: Spoon Oral Phase Impairments: Impaired mastication;Reduced lingual movement/coordination Oral Phase Functional Implications: Prolonged oral transit;Oral holding   Solid   GO   Solid: Impaired Presentation: Spoon Oral Phase Impairments: Poor awareness of bolus;Impaired mastication Oral Phase Functional Implications: Oral residue;Oral holding;Prolonged oral transit       Dimas AguasMelissa Omere Marti, MA, CCC-SLP Acute Rehab SLP [phone]  Fleet ContrasMelissa N Yiselle Babich 11/07/2017,9:04 AM

## 2017-11-07 NOTE — Progress Notes (Signed)
PULMONARY / CRITICAL CARE MEDICINE   Name: Isabella SevinSarah [name] MRN: [id number] DOB: 03/18/1921    ADMISSION DATE:  11/02/2017 CONSULTATION DATE:  11/02/17   REFERRING MD:  Dr Bor[place]/ [facility]  CHIEF COMPLAINT:  AMS/ dyspnea   HISTORY OF PRESENT ILLNESS:   84 yobf NH resident, dnr status with severe dementia referred to er pm 2/3 wit FTT with poor po intake, worsening AMS x several days with initial w/u c/w large R PTX and severe hypernatemia and PCCM service asked to admit.  Daughter last saw mother 3 weeks pta and was able to recognize her and converse, feed herself but very inactive otherwise and there was no h/o R cp or cough that her daughter was aware of nor chest trauma  Of any kind.  SUBJECTIVE:  No distress.   VITAL SIGNS: BP 109/72 (BP Location: Right Arm)   Pulse 62   Temp 98.5 F (36.9 C) (Oral)   Resp 16   Ht 5' (1.524 m)   Wt 104 lb 15 oz (47.6 kg)   SpO2 97%   BMI 20.49 kg/m   HEMODYNAMICS:    VENTILATOR SETTINGS:    INTAKE / OUTPUT: I/O last 3 completed shifts: In: 2190.3 [P.O.:287; I.V.:1903.3] Out: 700 [Urine:700]  PHYSICAL EXAMINATION: Gen:      No acute distress HEENT:  EOMI, sclera anicteric Neck:     No masses; no thyromegaly Lungs:    Clear to auscultation bilaterally; normal respiratory effort CV:         Regular rate and rhythm; no murmurs Abd:      + bowel sounds; soft, non-tender; no palpable masses, no distension Ext:    No edema; adequate peripheral perfusion Skin:      Warm and dry; no rash Neuro: Non verbal  LABS:  BMET Recent Labs  Lab 11/05/17 0943 11/06/17 0513 11/07/17 0505  NA 142 141 141  K 4.2 4.7 3.4*  CL 115* 113* 110  CO2 25 25 25   BUN 16 9 11   CREATININE 0.73 0.64 0.73  GLUCOSE 112* 114* 80    Electrolytes Recent Labs  Lab 11/04/17 0615 11/05/17 0943 11/06/17 0513 11/07/17 0505  CALCIUM 8.6* 8.3* 8.2* 8.4*  MG 2.1  --   --  1.8  PHOS 1.8*  --  3.1  --     CBC Recent Labs  Lab 11/03/17 1406 11/06/17 0513  11/07/17 0505  WBC 9.3 4.9 3.8*  HGB 13.5 12.3 12.8  HCT 43.4 38.0 39.5  PLT 169 133* 154    Coag's No results for input(s): APTT, INR in the last 168 hours.  Sepsis Markers No results for input(s): LATICACIDVEN, PROCALCITON, O2SATVEN in the last 168 hours.  ABG No results for input(s): PHART, PCO2ART, PO2ART in the last 168 hours.  Liver Enzymes Recent Labs  Lab 11/02/17 1439 11/06/17 0513  AST 45* 41  ALT 47 46  ALKPHOS 93 63  BILITOT 1.0 1.1  ALBUMIN 4.3 2.5*    Cardiac Enzymes No results for input(s): TROPONINI, PROBNP in the last 168 hours.  Glucose No results for input(s): GLUCAP in the last 168 hours.  Imaging Dg Chest Port 1 View  Result Date: 11/07/2017 CLINICAL DATA:  Follow-up pneumothorax EXAM: PORTABLE CHEST 1 VIEW COMPARISON:  11/06/2017 FINDINGS: Cardiac shadow is stable. Thoracic aorta is mildly tortuous with calcifications. The lungs are well aerated bilaterally. The previously seen right pneumothorax is stable in appearance. No new focal abnormality is noted. Old rib fractures are seen on the left. Improved  aeration in the left base is noted when compared with the previous exam. IMPRESSION: Stable right pneumothorax. Improved aeration in the left base. Electronically Signed   By: Alcide Clever M.D.   On: 11/07/2017 07:41   SIGNIFICANT EVENTS:   LINES/TUBES: R chest tube 11/02/17 >>> 2/5  ASSESSMENT / PLAN: Severe hypernatremia in setting of volume depletion Severe underlying dementia/ non-verbal  Tension R PTX. Spontaneous -resolved 2/5 (chest tube removed); has small residual apical right PTX s/p removal 84 year old with spontaneous right pneumothorax with tension Status post chest tube.  Tension R PTX. Spontaneous -resolved 2/5 (chest tube removed); has small residual apical right PTX s/p removal PCXR review: Stable pneumothorax.  Plan: Wean down O2 as tolerated No change in minimal pneumothorax on CXR. Post CT removal This should likely  resolve slowly over the next week or so. Can get a follow up CXR in 2 weeks  PCCM will be available as needed. Please call with questions.  Chilton Greathouse MD [facility] Pager [phone] If no answer or after 3pm call: [phone] 11/07/2017, 3:34 PM

## 2017-11-08 DIAGNOSIS — N179 Acute kidney failure, unspecified: Secondary | ICD-10-CM

## 2017-11-08 DIAGNOSIS — J9601 Acute respiratory failure with hypoxia: Secondary | ICD-10-CM

## 2017-11-08 DIAGNOSIS — E876 Hypokalemia: Secondary | ICD-10-CM

## 2017-11-08 DIAGNOSIS — J930 Spontaneous tension pneumothorax: Principal | ICD-10-CM

## 2017-11-08 DIAGNOSIS — R627 Adult failure to thrive: Secondary | ICD-10-CM

## 2017-11-08 LAB — CBC WITH DIFFERENTIAL/PLATELET
BASOS PCT: 0 %
Basophils Absolute: 0 10*3/uL (ref 0.0–0.1)
Eosinophils Absolute: 0.3 10*3/uL (ref 0.0–0.7)
Eosinophils Relative: 7 %
HEMATOCRIT: 33.4 % — AB (ref 36.0–46.0)
HEMOGLOBIN: 11.2 g/dL — AB (ref 12.0–15.0)
LYMPHS ABS: 1.2 10*3/uL (ref 0.7–4.0)
Lymphocytes Relative: 29 %
MCH: 31.7 pg (ref 26.0–34.0)
MCHC: 33.5 g/dL (ref 30.0–36.0)
MCV: 94.6 fL (ref 78.0–100.0)
MONO ABS: 0.6 10*3/uL (ref 0.1–1.0)
MONOS PCT: 14 %
NEUTROS PCT: 50 %
Neutro Abs: 2.1 10*3/uL (ref 1.7–7.7)
Platelets: 172 10*3/uL (ref 150–400)
RBC: 3.53 MIL/uL — ABNORMAL LOW (ref 3.87–5.11)
RDW: 13.9 % (ref 11.5–15.5)
WBC: 4.1 10*3/uL (ref 4.0–10.5)

## 2017-11-08 LAB — BASIC METABOLIC PANEL
Anion gap: 3 — ABNORMAL LOW (ref 5–15)
BUN: 11 mg/dL (ref 6–20)
CALCIUM: 8 mg/dL — AB (ref 8.9–10.3)
CHLORIDE: 112 mmol/L — AB (ref 101–111)
CO2: 25 mmol/L (ref 22–32)
CREATININE: 0.66 mg/dL (ref 0.44–1.00)
GFR calc Af Amer: >60 mL/min (ref >60)
GFR calc non Af Amer: >60 mL/min (ref >60)
GLUCOSE: 82 mg/dL (ref 65–99)
Potassium: 3.6 mmol/L (ref 3.5–5.1)
Sodium: 140 mmol/L (ref 135–145)

## 2017-11-08 LAB — MAGNESIUM: Magnesium: 2.2 mg/dL (ref 1.7–2.4)

## 2017-11-08 MED ORDER — SENNOSIDES-DOCUSATE SODIUM 8.6-50 MG PO TABS: 1.0000 | ORAL_TABLET | Freq: Every day | ORAL | 0 refills | Status: DC

## 2017-11-08 MED ORDER — TRAMADOL HCL 50 MG PO TABS: 50.0000 mg | ORAL_TABLET | Freq: Two times a day (BID) | ORAL | 0 refills | Status: DC | PRN

## 2017-11-08 MED ORDER — ACETAMINOPHEN 500 MG PO TABS: 500.0000 mg | ORAL_TABLET | Freq: Two times a day (BID) | ORAL | 0 refills | Status: DC

## 2017-11-08 MED ORDER — POTASSIUM CHLORIDE CRYS ER 20 MEQ PO TBCR
40.0000 meq | EXTENDED_RELEASE_TABLET | Freq: Once | ORAL | Status: AC
  Administered 2017-11-08: 40 meq via ORAL
  Filled 2017-11-08: qty 2

## 2017-11-08 MED ORDER — LEVETIRACETAM 500 MG PO TABS: 500.0000 mg | ORAL_TABLET | ORAL | Status: DC

## 2017-11-08 MED ORDER — MAGNESIUM OXIDE 400 (241.3 MG) MG PO TABS: 400.0000 mg | ORAL_TABLET | Freq: Every day | ORAL | 0 refills | Status: DC

## 2017-11-08 MED ORDER — LORAZEPAM 0.5 MG PO TABS: 0.2500 mg | ORAL_TABLET | Freq: Every day | ORAL | 0 refills | Status: DC | PRN

## 2017-11-08 NOTE — Progress Notes (Signed)
Attempted to call report x5 to [facility].  No answer. RN contact number given to PTAR driver to give to receiving nurse.  Will try calling report at a later time.

## 2017-11-08 NOTE — Clinical Social Work Placement (Signed)
Patient received and accepted bed offer at [facility]. Facility aware of patient's discharge and confirmed bed offer. [facility] contacted, patient's family notified. Patient's RN can call report to [phone], packet complete. CSW signing off, no other needs identified at this time.  CLINICAL SOCIAL WORK PLACEMENT  NOTE  Date:  11/08/2017  Patient Details  Name: Isabella SevinSarah [name] MRN: [id number] Date of Birth: 02/09/1921  Clinical Social Work is seeking post-discharge placement for this patient at the Skilled  Nursing Facility level of care (*CSW will initial, date and re-position this form in  chart as items are completed):  Yes   Patient/family provided with [facility] Clinical Social Work Department's list of facilities offering this level of care within the geographic area requested by the patient (or if unable, by the patient's family).  Yes   Patient/family informed of their freedom to choose among providers that offer the needed level of care, that participate in Medicare, Medicaid or managed care program needed by the patient, have an available bed and are willing to accept the patient.  Yes   Patient/family informed of [facility]'s ownership interest in [facility] and [facility], as well as of the fact that they are under no obligation to receive care at these facilities.  PASRR submitted to EDS on 11/06/17     PASRR number received on 11/06/17     Existing PASRR number confirmed on       FL2 transmitted to all facilities in geographic area requested by pt/family on 11/06/17     FL2 transmitted to all facilities within larger geographic area on       Patient informed that his/her managed care company has contracts with or will negotiate with certain facilities, including the following:        Yes   Patient/family informed of bed offers received.  [facility]     Physician recommends and patient chooses bed at      [facility] 11/08/17.  Patient to be transferred to facility by PTAR     Patient family notified on 11/08/17 of transfer.  Name of family member notified:  Isabella FentonBeverly [name]     PHYSICIAN       Additional Comment:    _______________________________________________ [name], LCSW 11/08/2017, 2:47 PM

## 2017-11-08 NOTE — Discharge Summary (Signed)
Discharge Summary  Isabella [name] WUJ:[id number] DOB: 02-18-1921  PCP: Mortimer Fries, PA  Admit date: 11/02/2017 Discharge date: 11/08/2017  Time spent: >57mins, more than 50 %time spent on coordination of care.  Recommendations for Outpatient Follow-up:  1. F/u with SNF MD within a week  for hospital discharge follow up, repeat cbc/bmp at follow up 2. Repeat cxr in two weeks, chest tube suture removal on 2/12. 3. Palliative care continue to follow patient at SNF  Discharge Diagnoses:  Active Hospital Problems   Diagnosis Date Noted  . Palliative care encounter   . Acute respiratory failure (HCC)   . Pneumothorax on right 11/02/2017  . Hypernatremia 11/02/2017    Resolved Hospital Problems  No resolved problems to display.    Discharge Condition: stable  Diet recommendation:  Dysphagia 1 with thin liquids   Medication Administration: Crushed with puree   Filed Weights   11/02/17 1738 11/05/17 0616 11/06/17 0423  Weight: 43.9 kg (96 lb 12.5 oz) 47 kg (103 lb 9.9 oz) 47.6 kg (104 lb 15 oz)    History of present illness: (per admitting MD , ICU attending Dr Sherene Sires) REFERRING MD:  Dr Shurtz/ [facility]  CHIEF COMPLAINT:  AMS/ dyspnea   HISTORY OF PRESENT ILLNESS:   89 yobf NH resident, dnr status with severe dementia referred to er pm 2/3 wit FTT with poor po intake, worsening AMS x several days with initial w/u c/w large R PTX and severe hypernatemia and PCCM service asked to admit.  Daughter last saw mother 3 weeks pta and was able to recognize her and converse, feed herself but very inactive otherwise and there was no h/o R cp or cough that her daughter was aware of nor chest trauma  Of any kind.    Hospital Course:  Active Problems:   Pneumothorax on right   Hypernatremia   Acute respiratory failure [facility])   Palliative care encounter  Spontaneous tension pneumothorax -She was admitted to icu on admission. S/p chest tube and chest tube removed, she is transferred to  hospitalist service on 2/6 -Per critical care "Stable pneumothorax noted post chest tube removal. No indication for repeat chest tube."  -repeat cxr in two weeks after discharge.  Hyponatremia secondary to dehydration -Sodium 164 on presentation -She received  D5W, sodium 141 ,d/c ivf, encourage oral intake -Thought doubt patient will eat enough , palliative care consulted for goals of care. "DNR in the event of cardiac or pulmonary arrest. Limited interventions - readmit to the hospital but not ICU, DNI Antibiotics OK IVFs for a defined trial period NO FEEDING TUBE"   Hypophosphatemia/hypokalemia/hypomagnesemia: replaced, normalized at discharge. She is discharged on potassium and mag supplement.  AKI ON CKDII: BUN 67/CR 1.4 ON PRESENTATION ua concentrated with ketone , but no infection. Normalized with hydration  H/o seizure: continue home meds. keppra  Dementia/FTT/severe malnutrition:  -Continue home meds risperidone, prn ativan -Daughter last saw mother 3 weeks ago prior to this hospitalization, per daughter patient was able to recognize her and converse, feed herself but very inactive otherwise  -Per daughter patient has  weight loss (per chart review , patient weighed 120 in 02/2017, currently she weight 103 pounds)   MRSA colonization: contact precaution, decolonization.  Code Status: DNR  Family Communication: patient   Disposition Plan:  SNF with palliative care following    Consultants:  Critical care  Palliative care  Procedures:  Chest tube placement and removal  Antibiotics:  none   Discharge Exam: BP 130/69 (BP Location:  Right Arm)   Pulse 70   Temp 98.4 F (36.9 C) (Oral)   Resp 14   Ht 5' (1.524 m)   Wt 47.6 kg (104 lb 15 oz)   SpO2 99%   BMI 20.49 kg/m    General:  Frail, thin, demented, not following commands  Cardiovascular: RRR  Respiratory: CTABL  Abdomen: Soft/ND/NT, positive BS  Musculoskeletal: No  Edema  Neuro: alert, oriented to self only   Discharge Instructions You were cared for by a hospitalist during your hospital stay. If you have any questions about your discharge medications or the care you received while you were in the hospital after you are discharged, you can call the unit and asked to speak with the hospitalist on call if the hospitalist that took care of you is not available. Once you are discharged, your primary care physician will handle any further medical issues. Please note that NO REFILLS for any discharge medications will be authorized once you are discharged, as it is imperative that you return to your primary care physician (or establish a relationship with a primary care physician if you do not have one) for your aftercare needs so that they can reassess your need for medications and monitor your lab values.  Discharge Instructions    Diet general   Complete by:  As directed    Dysphagia 1 with thin liquids   Medication Administration: Crushed with puree   Aspiration precaution.   Increase activity slowly   Complete by:  As directed      Allergies as of 11/08/2017   No Known Allergies     Medication List    STOP taking these medications   chlorthalidone 25 MG tablet Commonly known as:  HYGROTON   docusate sodium 100 MG capsule Commonly known as:  COLACE   NUTRITIONAL DRINK PO     TAKE these medications   acetaminophen 500 MG tablet Commonly known as:  TYLENOL Take 1 tablet (500 mg total) by mouth 2 (two) times daily. What changed:    how much to take  Another medication with the same name was removed. Continue taking this medication, and follow the directions you see here.   aspirin 81 MG chewable tablet Chew 81 mg by mouth daily.   levETIRAcetam 250 MG tablet Commonly known as:  KEPPRA Take 250 mg every evening by mouth.   levETIRAcetam 500 MG tablet Commonly known as:  KEPPRA Take 1 tablet (500 mg total) by mouth every morning.    LORazepam 0.5 MG tablet Commonly known as:  ATIVAN Take 0.5 tablets (0.25 mg total) by mouth daily as needed (agitation). What changed:  Another medication with the same name was removed. Continue taking this medication, and follow the directions you see here.   magnesium oxide 400 (241.3 Mg) MG tablet Commonly known as:  MAG-OX Take 1 tablet (400 mg total) by mouth daily.   Melatonin 3 MG Caps Take 3 mg by mouth at bedtime.   mirtazapine 15 MG tablet Commonly known as:  REMERON Take 7.5 mg by mouth at bedtime.   potassium chloride SA 20 MEQ tablet Commonly known as:  K-DUR,KLOR-CON Take 20 mEq by mouth daily.   risperiDONE 0.5 MG tablet Commonly known as:  RISPERDAL Take 0.5 mg by mouth at bedtime.   senna-docusate 8.6-50 MG tablet Commonly known as:  Senokot-S Take 1 tablet by mouth at bedtime.   SYSTANE 0.4-0.3 % Soln Generic drug:  Polyethyl Glycol-Propyl Glycol Place 1 drop into both eyes  2 (two) times daily.   traMADol 50 MG tablet Commonly known as:  ULTRAM Take 1 tablet (50 mg total) by mouth every 12 (twelve) hours as needed for severe pain. What changed:    when to take this  reasons to take this   Vitamin D 2000 units tablet Take 2,000 Units by mouth daily.      No Known Allergies Follow-up Information    Dc sutures 2/12,CXR in 2 weeks Follow up.        palliative care to continue follow patient at SNF Follow up.            The results of significant diagnostics from this hospitalization (including imaging, microbiology, ancillary and laboratory) are listed below for reference.    Significant Diagnostic Studies: Dg Chest 2 View  Result Date: 11/02/2017 CLINICAL DATA:  Shortness of breath EXAM: CHEST  2 VIEW COMPARISON:  08/16/2017 FINDINGS: There is a large right pneumothorax measuring greater than 90%. There is leftward shift of the trachea concerning for a tension pneumothorax. The left lung is clear. There is no left pneumothorax. There is  no pleural effusion. The heart mediastinum are stable. The osseous structures are unremarkable. IMPRESSION: Large right pneumothorax measuring greater than 90% with leftward shift of the trachea concerning for a tension pneumothorax. Critical Value/emergent results were called by telephone at the time of interpretation on 11/02/2017 at 2:12 pm to Dr. Lorre Nick , who verbally acknowledged these results. Electronically Signed   By: Elige Ko   On: 11/02/2017 14:14   Ct Head Wo Contrast  Result Date: 11/02/2017 CLINICAL DATA:  Altered mental status. EXAM: CT HEAD WITHOUT CONTRAST TECHNIQUE: Contiguous axial images were obtained from the base of the skull through the vertex without intravenous contrast. COMPARISON:  03/10/2017. FINDINGS: Brain: Diffusely enlarged ventricles and subarachnoid spaces. Patchy white matter low density in both cerebral hemispheres. No intracranial hemorrhage, mass lesion or CT evidence of acute infarction. Vascular: No hyperdense vessel or unexpected calcification. Skull: Normal. Negative for fracture or focal lesion. Sinuses/Orbits: Status post bilateral cataract extraction. Unremarkable paranasal sinuses. Chronic bilateral mastoid air cell opacification and underdevelopment. Other: None. IMPRESSION: 1. No acute abnormality. 2. Stable cerebral and cerebellar atrophy and chronic small vessel white matter ischemic changes. Electronically Signed   By: Beckie Salts M.D.   On: 11/02/2017 14:33   Dg Chest Port 1 View  Result Date: 11/07/2017 CLINICAL DATA:  Follow-up pneumothorax EXAM: PORTABLE CHEST 1 VIEW COMPARISON:  11/06/2017 FINDINGS: Cardiac shadow is stable. Thoracic aorta is mildly tortuous with calcifications. The lungs are well aerated bilaterally. The previously seen right pneumothorax is stable in appearance. No new focal abnormality is noted. Old rib fractures are seen on the left. Improved aeration in the left base is noted when compared with the previous exam.  IMPRESSION: Stable right pneumothorax. Improved aeration in the left base. Electronically Signed   By: Alcide Clever M.D.   On: 11/07/2017 07:41   Dg Chest Port 1 View  Addendum Date: 11/06/2017   ADDENDUM REPORT: 11/06/2017 13:55 ADDENDUM: First sentence in the impression should read: Stable right apical pneumothorax without tension component. Electronically Signed   By: Bretta Bang III M.D.   On: 11/06/2017 13:55   Result Date: 11/06/2017 CLINICAL DATA:  Right-sided pneumothorax EXAM: PORTABLE CHEST 1 VIEW COMPARISON:  November 04, 2017 FINDINGS: There is a persistent small apical pneumothorax on the right without tension component. There is no appreciable edema or consolidation. There is medial left base atelectasis. There are  small granulomas in the right upper lobe. Heart is upper normal in size with pulmonary vascular within normal limits. There is aortic atherosclerosis. No bone lesions. IMPRESSION: Stable right apical pneumothorax that tension component. Medial left base atelectasis. No edema or consolidation. Stable cardiac silhouette. There is aortic atherosclerosis. Aortic Atherosclerosis (ICD10-I70.0). Electronically Signed: By: Bretta Bang III M.D. On: 11/05/2017 07:06   Dg Chest Port 1 View  Result Date: 11/06/2017 CLINICAL DATA:  Right-sided pneumothorax EXAM: PORTABLE CHEST 1 VIEW COMPARISON:  November 05, 2017 FINDINGS: Right apical pneumothorax is stable.  No tension component. There is patchy airspace consolidation in the left base, new. Minimal left pleural effusion. Heart is upper normal in size with pulmonary vascularity normal. No adenopathy. There is aortic atherosclerosis. No bone lesions. IMPRESSION: 1.  Stable small right apical pneumothorax.  No tension component. 2.  New left base infiltrate with small left pleural effusion. 3.  Stable cardiac silhouette.  Aortic atherosclerosis present. Aortic Atherosclerosis (ICD10-I70.0). Electronically Signed   By: Bretta Bang  III M.D.   On: 11/06/2017 13:54   Dg Chest Port 1 View  Result Date: 11/04/2017 CLINICAL DATA:  Altered level of consciousness, history of right-sided pneumothorax. EXAM: PORTABLE CHEST 1 VIEW COMPARISON:  Portable chest x-ray November 04, 2017 at 11:19 a.m. FINDINGS: A small right apical pneumothorax persists. It has not appreciably changed in size. There is stable patchy density in the right upper lobe. There is stable right hilar prominence. The left lung is well-expanded. There is no pleural effusion. There is mild elevation of the right hemidiaphragm. The heart is normal in size. There is no pulmonary vascular congestion. IMPRESSION: Stable approximately 10-15% right apical pneumothorax. Electronically Signed   By: David  [place] M.D.   On: 11/04/2017 14:19   Dg Chest Port 1 View  Result Date: 11/04/2017 CLINICAL DATA:  Status post chest tube removal today. EXAM: PORTABLE CHEST 1 VIEW COMPARISON:  Single-view of the chest earlier today. FINDINGS: Right chest tube is been removed. The patient has a right pneumothorax estimated at 10-15%. The left lung is expanded and clear. Heart size is upper normal. No pleural fluid. Small volume of subcutaneous air in the right chest noted. Remote right rib fractures are seen. IMPRESSION: Small right pneumothorax estimated at 10-15% after chest tube removal today. Critical Value/emergent results were called by telephone at the time of interpretation on 11/04/2017 at 11:43 am to [facility] , who verbally acknowledged these results. Electronically Signed   By: Drusilla Kanner M.D.   On: 11/04/2017 11:45   Dg Chest Port 1 View  Result Date: 11/04/2017 CLINICAL DATA:  Respiratory failure EXAM: PORTABLE CHEST 1 VIEW COMPARISON:  11/03/2017 FINDINGS: Right-sided chest tube is again noted. No pneumothorax is seen. Cardiac shadow is stable. The lungs are well aerated without focal infiltrate or sizable effusion. Old rib fractures are noted on the left. No new focal  abnormality is noted. IMPRESSION: Stable right chest tube.  No definitive pneumothorax is noted. Electronically Signed   By: Alcide Clever M.D.   On: 11/04/2017 07:14   Dg Chest Port 1 View  Result Date: 11/03/2017 CLINICAL DATA:  Pneumothorax EXAM: PORTABLE CHEST 1 VIEW COMPARISON:  11/03/2017 FINDINGS: Right basilar chest tube unchanged in position. Small right apical pneumothorax slightly improved. No effusion. Lungs are clear without infiltrate effusion or edema. IMPRESSION: Right chest tube remains in place. Improvement in small right apical pneumothorax since earlier today. Electronically Signed   By: Marlan Palau M.D.   On: 11/03/2017  15:40   Dg Chest Port 1 View  Result Date: 11/03/2017 CLINICAL DATA:  Follow-up pneumothorax. EXAM: PORTABLE CHEST 1 VIEW COMPARISON:  Portable chest x-ray of November 02, 2017 FINDINGS: The right apical pneumothorax is again demonstrated and appears stable allowing for differences in positioning. The right chest tube tip projects over the posteromedial aspect of the right ninth rib. There is no significant pleural effusion. The right hemidiaphragm remains elevated. There is air in the right axillary soft tissues. There is no mediastinal shift. There is hazy increased density in the right mid lung more conspicuous today. The left lung is well-expanded and clear. The heart and pulmonary vascularity are normal. There calcification in the wall of the aortic arch. IMPRESSION: Stable approximately 10% right apical pneumothorax. Stable positioning of the right-sided chest tube. Small amount of subcutaneous emphysema in the right axillary region. Hazy increased density in the right mid to lower lung which is new and may reflect developing infiltrate. No CHF. Thoracic aortic atherosclerosis. Electronically Signed   By: David  [place] M.D.   On: 11/03/2017 08:47   Dg Chest Port 1 View  Result Date: 11/02/2017 CLINICAL DATA:  Right pneumothorax status post chest tube insertion.  EXAM: PORTABLE CHEST 1 VIEW COMPARISON:  11/02/2017 FINDINGS: Right-sided chest tube has been placed, with significant decrease in right pneumothorax. Small right apical pneumothorax persists, measuring 12 millimeters at the apex. There is persistent elevation of the right hemidiaphragm. Lungs are clear. There is a bone island within the right posterior 5th rib. IMPRESSION: Smaller right pneumothorax following placement of right-sided chest tube. Electronically Signed   By: Norva Pavlov M.D.   On: 11/02/2017 15:54    Microbiology: Recent Results (from the past 240 hour(s))  Urine Culture     Status: None   Collection Time: 11/02/17  3:00 PM  Result Value Ref Range Status   Specimen Description   Final    URINE, CLEAN CATCH Performed at [facility], [address] [place], [place] [address]    Special Requests   Final    NONE Performed at [facility], [address] [place], [place] [address]    Culture   Final    NO GROWTH Performed at [facility], [address], [place], [place] [address]    Report Status 11/04/2017 FINAL  Final  MRSA PCR Screening     Status: Abnormal   Collection Time: 11/02/17  8:42 PM  Result Value Ref Range Status   MRSA by PCR POSITIVE (A) NEGATIVE Final    Comment:        The GeneXpert MRSA Assay (FDA approved for NASAL specimens only), is one component of a comprehensive MRSA colonization surveillance program. It is not intended to diagnose MRSA infection nor to guide or monitor treatment for MRSA infections. RESULT CALLED TO, READ BACK BY AND VERIFIED WITH: H NJAJA,RN @0006  11/03/17 MKELLY 11/03/17 Performed at [facility], [address] [place], [place] [address]      Labs: Basic Metabolic Panel: Recent Labs  Lab 11/04/17 0615 11/05/17 0943 11/06/17 0513 11/07/17 0505 11/08/17 0610  NA 152* 142 141 141 140  K 4.1 4.2 4.7 3.4* 3.6  CL 120* 115* 113* 110 112*  CO2  27 25 25 25 25   GLUCOSE 126* 112* 114* 80 82  BUN 24* 16 9 11 11   CREATININE 0.81 0.73 0.64 0.73 0.66  CALCIUM 8.6* 8.3* 8.2* 8.4* 8.0*  MG 2.1  --   --  1.8 2.2  PHOS 1.8*  --  3.1  --   --    Liver Function Tests: Recent Labs  Lab 11/02/17 1439 11/06/17 0513  AST 45* 41  ALT 47 46  ALKPHOS 93 63  BILITOT 1.0 1.1  PROT 8.9* 5.5*  ALBUMIN 4.3 2.5*   No results for input(s): LIPASE, AMYLASE in the last 168 hours. No results for input(s): AMMONIA in the last 168 hours. CBC: Recent Labs  Lab 11/02/17 1439 11/03/17 1406 11/06/17 0513 11/07/17 0505 11/08/17 0610  WBC 6.6 9.3 4.9 3.8* 4.1  NEUTROABS 5.1  --   --  2.1 2.1  HGB 16.7* 13.5 12.3 12.8 11.2*  HCT 52.4* 43.4 38.0 39.5 33.4*  MCV 100.2* 99.3 95.7 95.4 94.6  PLT 235 169 133* 154 172   Cardiac Enzymes: No results for input(s): CKTOTAL, CKMB, CKMBINDEX, TROPONINI in the last 168 hours. BNP: BNP (last 3 results) No results for input(s): BNP in the last 8760 hours.  ProBNP (last 3 results) No results for input(s): PROBNP in the last 8760 hours.  CBG: No results for input(s): GLUCAP in the last 168 hours.     Signed:  Albertine GratesFang Horatio Bertz MD, PhD  [facility] 11/08/2017, 10:29 AM

## 2017-11-10 ENCOUNTER — Encounter: Payer: Self-pay | Admitting: Adult Health

## 2017-11-10 ENCOUNTER — Non-Acute Institutional Stay (SKILLED_NURSING_FACILITY): Payer: Medicare Other | Admitting: Adult Health

## 2017-11-10 DIAGNOSIS — F0150 Vascular dementia without behavioral disturbance: Secondary | ICD-10-CM

## 2017-11-10 DIAGNOSIS — K5901 Slow transit constipation: Secondary | ICD-10-CM | POA: Insufficient documentation

## 2017-11-10 DIAGNOSIS — E876 Hypokalemia: Secondary | ICD-10-CM | POA: Insufficient documentation

## 2017-11-10 DIAGNOSIS — I10 Essential (primary) hypertension: Secondary | ICD-10-CM | POA: Diagnosis not present

## 2017-11-10 DIAGNOSIS — R52 Pain, unspecified: Secondary | ICD-10-CM | POA: Diagnosis not present

## 2017-11-10 DIAGNOSIS — G8929 Other chronic pain: Secondary | ICD-10-CM

## 2017-11-10 DIAGNOSIS — F0390 Unspecified dementia without behavioral disturbance: Secondary | ICD-10-CM | POA: Insufficient documentation

## 2017-11-10 NOTE — Progress Notes (Signed)
[name] Room Number: [id number] Place of Service:  SNF (31)   CODE STATUS: DNR NO FEEDING TUBE  No Known Allergies  Chief Complaint  Patient presents with  . Hospitalization Follow-up    HPI:  She is a 84 year old woman who had been a resident of [facility] assisted living who has been hospitalized from 11-02-17 through 11-08-17 for a spontaneous tensionright pneumothorax. She had severe dehydration with hypernatremia; she did receive IVF with her electrolyte imbalances resolved. She is here for short term rehab. Per her daughter she was ambulatory prior to her hospitalization. She would like for her mother to return back to assisted living and to be ambulatory. We did have a long discussion and I did educate her that this may not be a possibility; but that we would give her every opportunity to achieve this goal. Her daughter did verbalize understanding.  She is unable to participate in the hpi or ros. There are no reports of behavioral issues; no reports of poor po intake; no reports of uncontrolled pain. She will continue to be followed for her chronic medical issues including: seizures; dementia; hypertension. There are no nursing concerns at this time.   Past Medical History:  Diagnosis Date  . Dementia   . Hearing loss   . Hypertension   . Seizures (HCC)   . Vascular dementia   . Weight loss     History reviewed. No pertinent surgical history.  Social History   Socioeconomic History  . Marital status: Widowed    Spouse name: Not on file  . Number of children: Not on file  . Years of education: Not on file  . Highest education level: Not on file  Social Needs  . Financial resource strain: Not on file  . Food insecurity - worry: Not on file  . Food insecurity - inability: Not on file  . Transportation needs - medical: Not on file  . Transportation needs - non-medical: Not on file  Occupational History  . Not on file  Tobacco Use  .  Smoking status: Never Smoker  . Smokeless tobacco: Never Used  Substance and Sexual Activity  . Alcohol use: No  . Drug use: No  . Sexual activity: Not on file  Other Topics Concern  . Not on file  Social History Narrative  . Not on file   Family History  Family history unknown: Yes      VITAL SIGNS BP 127/73   Pulse 76   Temp (!) 96.7 F (35.9 C)   Resp 16   Ht 5' (1.524 m)   Wt 103 lb (46.7 kg)   SpO2 96%   BMI 20.12 kg/m   Outpatient Encounter Medications as of 11/10/2017  Medication Sig  . acetaminophen (TYLENOL) 500 MG tablet Take 1 tablet (500 mg total) by mouth 2 (two) times daily.  Marland Kitchen aspirin 81 MG chewable tablet Chew 81 mg by mouth daily.  . Cholecalciferol (VITAMIN D) 2000 units tablet Take 2,000 Units by mouth daily.  Marland Kitchen levETIRAcetam (KEPPRA) 250 MG tablet Take 250 mg every evening by mouth.  . levETIRAcetam (KEPPRA) 500 MG tablet Take 1 tablet (500 mg total) by mouth every morning.  Marland Kitchen LORazepam (ATIVAN) 0.5 MG tablet Take 0.5 tablets (0.25 mg total) by mouth daily as needed (agitation).  . magnesium oxide (MAG-OX) 400 (241.3 Mg) MG tablet Take 1 tablet (400 mg total) by mouth daily.  . Melatonin 3 MG CAPS Take  3 mg by mouth at bedtime.  . mirtazapine (REMERON) 15 MG tablet Take 7.5 mg by mouth at bedtime.  Bertram Gala Glycol-Propyl Glycol (SYSTANE) 0.4-0.3 % SOLN Place 1 drop into both eyes 2 (two) times daily.  . potassium chloride SA (K-DUR,KLOR-CON) 20 MEQ tablet Take 20 mEq by mouth daily.  . risperiDONE (RISPERDAL) 0.5 MG tablet Take 0.5 mg by mouth at bedtime.  . senna-docusate (SENOKOT-S) 8.6-50 MG tablet Take 1 tablet by mouth at bedtime.  . traMADol (ULTRAM) 50 MG tablet Take 1 tablet (50 mg total) by mouth every 12 (twelve) hours as needed for severe pain.   No facility-administered encounter medications on file as of 11/10/2017.      SIGNIFICANT DIAGNOSTIC EXAMS  TODAY:   11-02-17: chest x-ray: Large right pneumothorax measuring greater than  90% with leftward shift of the trachea concerning for a tension pneumothorax.  11-02-17: ct of head:  1. No acute abnormality. 2. Stable cerebral and cerebellar atrophy and chronic small vessel white matter ischemic changes.  11-03-17: chest x-ray: Right chest tube remains in place. Improvement in small right apical pneumothorax since earlier today.  11-06-17: chest x-ray:  1.  Stable small right apical pneumothorax.  No tension component. 2.  New left base infiltrate with small left pleural effusion. 3.  Stable cardiac silhouette.  Aortic atherosclerosis present.  11-07-17: chest x-ray; Stable right pneumothorax. Improved aeration in the left base.   LABS REVIEWED: TODAY:   11-02-17: wbc 6.6; hgb 16.7; hct 52.4 ;mcv 100.2; plt 235; glucose 110; bun 67 creat 1.40; k+ 3.5; an++ 164; ca 8.9; liver normal albumin 4.3; urine culture: no growth 11-04-17: glucose 126; bun 24; creat 0.81; k+ 4.1; na++ 152 mag 2.1; phos 1.8  11-06-17: wbc 4.9; hgb 12.3; hct 38.0; mcv 95.7plt 133; glucose 114; bun 9; creat 0.64; k+ 4.7; na++ 141; ca 8.2; liver normal albumin 2.5 11-08-17: wbc 4.1; hgb 11.2; hct 33.4 ;mcv 94.6 plt 172; glucose 82; bun 11; creat 0.66; k+ 3.6; na++ 140; ca 8.0; mag 2.2     Review of Systems  Unable to perform ROS: Dementia (nonverbal )    Physical Exam  Constitutional: She appears well-developed and well-nourished. No distress.  Frail   Neck: No thyromegaly present.  Cardiovascular: Normal rate, regular rhythm, normal heart sounds and intact distal pulses.  Pulmonary/Chest: Effort normal and breath sounds normal. No respiratory distress.  Abdominal: Soft. Bowel sounds are normal. She exhibits no distension. There is no tenderness.  Musculoskeletal: She exhibits no edema.  Does not move voluntarily Able to perform passive range of motion   Lymphadenopathy:    She has no cervical adenopathy.  Skin: Skin is warm and dry. She is not diaphoretic.    ASSESSMENT/  PLAN:  TODAY:  1.essential benign hypertension: stable b/p 127/73: is presently not taking medications will continue asa 81 mg daily and will monitor her status.   2. Vascular dementia without behavioral disturbance: weight is 103 pounds is presently without change is currently not on medications; will not make changes will monitor   3. Seizures: no reports of seizure activity present: will continue keppra 500 mg in the AM and 250 mg in the PM   4. Hypokalemia: k+ 3.6 stable will continue k+ 20 meq daily   5. Hypomagnesemia: stable mag 2.2 will continue mag ox 400 mg daily   6. Slow transit constipation: stable will continue senna s daily   7. Depression with anxiety: without change will continue remeron 7.5 mg nightly also  taking to help with appetite; and is taking melatonin 3 mg nightly for sleep will continue ativan 0.25 mg daily as needed for 14 days and will monitor   8. Chronic generalized pain disorder: pain is presently being managed: will continue ultram 50 mg twice daily and tylenol 500 mg twice daily  9. Psychosis in elderly without behavioral disturbance: is presently stable will lower her risperdal to 0.25 mg nightly and will monitor her response.   10. Right spontaneous pneumothorax: is status post chest tube placement and removal. Will not make changes will  Monitor her status.   11. CKD stage II: her acute dehydration has resolved. Will need to continue to encourage fluid intake. With her advanced dementia; she will remain very high risk for poor po intake; despite all efforts to encourage her intake.    Will check chest x-ray on 11-20-17 Will check cmp; mag on 11-20-17   MD is aware of resident's narcotic use and is in agreement with current plan of care. We will attempt to wean resident as apropriate   Synthia Innocenteborah Signora Zucco NP [facility]  Contact [phone] Monday through Friday 8am- 5pm  After hours call [phone]

## 2017-11-13 ENCOUNTER — Encounter: Payer: Self-pay | Admitting: Internal Medicine

## 2017-11-13 ENCOUNTER — Non-Acute Institutional Stay (SKILLED_NURSING_FACILITY): Payer: Medicare Other | Admitting: Internal Medicine

## 2017-11-13 DIAGNOSIS — I10 Essential (primary) hypertension: Secondary | ICD-10-CM

## 2017-11-13 DIAGNOSIS — F0150 Vascular dementia without behavioral disturbance: Secondary | ICD-10-CM | POA: Diagnosis not present

## 2017-11-13 DIAGNOSIS — R5381 Other malaise: Secondary | ICD-10-CM | POA: Diagnosis not present

## 2017-11-13 DIAGNOSIS — J939 Pneumothorax, unspecified: Secondary | ICD-10-CM

## 2017-11-13 DIAGNOSIS — E878 Other disorders of electrolyte and fluid balance, not elsewhere classified: Secondary | ICD-10-CM

## 2017-11-13 NOTE — Progress Notes (Signed)
Provider:  DR Isabella KirschnerMONICA S Juanda [name] Location:  [facility] Room Number: 117A Place of Service:  SNF (31)  PCP: Isabella Friesurl, David, PA Patient Care Team: [name], [name] as PCP - General (Internal Medicine)  Extended Emergency Contact Information Primary Emergency Contact: [name],Isabella [name]  [place] Home Phone: [phone] Mobile Phone: [phone] Relation: Daughter Secondary Emergency Contact: Isabella [name],Isabella [name]  [place] Mobile Phone: [phone] Relation: Grandaughter  Code Status: DNR Goals of Care: Advanced Directive information Advanced Directives 11/13/2017  Does Patient Have a Medical Advance Directive? Yes  Type of Advance Directive Out of facility DNR (pink MOST or yellow form)  Does patient want to make changes to medical advance directive? No - Patient declined  Would patient like information on creating a medical advance directive? -  Pre-existing out of facility DNR order (yellow form or pink MOST form) Yellow form placed in chart (order not valid for inpatient use)      Chief Complaint  Patient presents with  . New Admit To SNF    [facility] Admit to [facility]    HPI: Patient is a 84 y.o. female seen today for admission to SNF following hospital stay for acute respiratory failure, 90% right PTX, hypernatremia 2/2 dehydration, dementia, FTT, severe malnutrition with weight loss. She was admitted to the ICU for spontaneous right PTX. Chest tube inserted and then later removed. She wsa given IVF. NA 164 -->140; Cr 1.64-->0.66 at d/c. Hgb 16.7-->11.2; AST 41; ALT 46; albumin 2.5; Phos 1.8-->3.1; Mg 2.2 at d/c. She presents to SNF for short term rehab.   Today she reports no concerns. She is tolerating PT. No nursing issues. No recent falls. She is a poor historian due to dementia. Hx obtained from chart  HTN - diet controlled. Takes ASA 81 mg daily   Vascular dementia - stable. Weight 103 lbs. Albumin  2.5  Seizure d/o - stable on keppra 500 mg in the AM and 250 mg in the PM   Slow transit constipation - stable on senna s daily   Depression with anxiety - mood stable on remeron 7.5 mg nightly; melatonin 3 mg nightly for sleep; ativan 0.25 mg daily as needed for 14 days  Chronic pain syndrome - stable on ultram 50 mg twice daily and tylenol 500 mg twice daily  Psychosis in elderly without behavioral disturbance - stable on risperdal 0.25 mg nightly. GDR recently done.   Past Medical History:  Diagnosis Date  . Dementia   . Hearing loss   . Hypertension   . Seizures (HCC)   . Vascular dementia   . Weight loss    History reviewed. No pertinent surgical history.  reports that  has never smoked. she has never used smokeless tobacco. She reports that she does not drink alcohol or use drugs. Social History   Socioeconomic History  . Marital status: Widowed    Spouse name: Not on file  . Number of children: Not on file  . Years of education: Not on file  . Highest education level: Not on file  Social Needs  . Financial resource strain: Not on file  . Food insecurity - worry: Not on file  . Food insecurity - inability: Not on file  . Transportation needs - medical: Not on file  . Transportation needs - non-medical: Not on file  Occupational History  . Not on file  Tobacco Use  . Smoking status: Never Smoker  . Smokeless tobacco: Never Used  Substance and Sexual Activity  . Alcohol use: No  . Drug use: No  . Sexual activity: Not on file  Other Topics Concern  . Not on file  Social History Narrative  . Not on file    Functional Status Survey:    Family History  Family history unknown: Yes    Health Maintenance  Topic Date Due  . TETANUS/TDAP  03/30/1942  . DEXA SCAN  03/30/1988  . PNA vac Low Risk Adult (1 of 2 - PCV13) 03/30/1988  . INFLUENZA VACCINE  04/30/2017    No Known Allergies  Outpatient Encounter Medications as of 11/13/2017  Medication Sig  .  acetaminophen (TYLENOL) 500 MG tablet Take 1 tablet (500 mg total) by mouth 2 (two) times daily.  Marland Kitchen aspirin 81 MG chewable tablet Chew 81 mg by mouth daily.  . Cholecalciferol (VITAMIN D) 2000 units tablet Take 2,000 Units by mouth daily.  . Hypromellose (ARTIFICIAL TEARS) 0.4 % SOLN One drop both eyes daily  . levETIRAcetam (KEPPRA) 250 MG tablet Take 250 mg every evening by mouth.  . levETIRAcetam (KEPPRA) 500 MG tablet Take 1 tablet (500 mg total) by mouth every morning.  Marland Kitchen LORazepam (ATIVAN) 0.5 MG tablet Take 0.5 tablets (0.25 mg total) by mouth daily as needed (agitation).  . magnesium oxide (MAG-OX) 400 (241.3 Mg) MG tablet Take 1 tablet (400 mg total) by mouth daily.  . Melatonin 3 MG CAPS Take 3 mg by mouth at bedtime.  . mirtazapine (REMERON) 15 MG tablet Take 7.5 mg by mouth at bedtime.  Bertram Gala Glycol-Propyl Glycol (SYSTANE) 0.4-0.3 % SOLN Place 1 drop into both eyes 2 (two) times daily.  . potassium chloride SA (K-DUR,KLOR-CON) 20 MEQ tablet Take 20 mEq by mouth daily.  . risperiDONE (RISPERDAL) 0.5 MG tablet Take 0.5 mg by mouth at bedtime.  . senna-docusate (SENOKOT-S) 8.6-50 MG tablet Take 1 tablet by mouth at bedtime.  . traMADol (ULTRAM) 50 MG tablet Take 1 tablet (50 mg total) by mouth every 12 (twelve) hours as needed for severe pain.   No facility-administered encounter medications on file as of 11/13/2017.     Review of Systems  Unable to perform ROS: Dementia    Vitals:   11/13/17 1124  BP: 118/70  Pulse: 68  Resp: 16  Temp: (!) 95.5 F (35.3 C)  TempSrc: Oral  SpO2: 95%  Weight: 103 lb 9.6 oz (47 kg)  Height: 5' (1.524 m)   Body mass index is 20.23 kg/m. Physical Exam  Constitutional: She is oriented to person, place, and time. She appears well-developed.  Frail appearing sitting up in chair in NAD  HENT:  Mouth/Throat: Oropharynx is clear and moist. No oropharyngeal exudate.  MMM; no oral thrush  Eyes: Pupils are equal, round, and reactive to  light. No scleral icterus.  Neck: Neck supple. Carotid bruit is not present. No tracheal deviation present. No thyromegaly present.  Cardiovascular: Normal rate, regular rhythm and intact distal pulses. Exam reveals no gallop and no friction rub.  Murmur (1/6 SEM) heard. No LE edema b/l. no calf TTP.   Pulmonary/Chest: Effort normal and breath sounds normal. No stridor. No respiratory distress. She has no wheezes. She has no rales.  Poor inspiratory effort  Abdominal: Soft. Normal appearance and bowel sounds are normal. She exhibits no distension and no mass. There is no hepatomegaly. There is no tenderness. There is no rigidity, no rebound and no guarding. No hernia.  Musculoskeletal: She exhibits deformity (thoracic kyphosis).  Lymphadenopathy:  She has no cervical adenopathy.  Neurological: She is alert and oriented to person, place, and time. She has normal reflexes.  Skin: Skin is warm and dry. No rash noted.  Psychiatric: She has a normal mood and affect. Her behavior is normal. Judgment and thought content normal.    Labs reviewed: Basic Metabolic Panel: Recent Labs    11/04/17 0615  11/06/17 0513 11/07/17 0505 11/08/17 0610  NA 152*   < > 141 141 140  K 4.1   < > 4.7 3.4* 3.6  CL 120*   < > 113* 110 112*  CO2 27   < > 25 25 25   GLUCOSE 126*   < > 114* 80 82  BUN 24*   < > 9 11 11   CREATININE 0.81   < > 0.64 0.73 0.66  CALCIUM 8.6*   < > 8.2* 8.4* 8.0*  MG 2.1  --   --  1.8 2.2  PHOS 1.8*  --  3.1  --   --    < > = values in this interval not displayed.   Liver Function Tests: Recent Labs    08/16/17 1056 11/02/17 1439 11/06/17 0513  AST 26 45* 41  ALT 22 47 46  ALKPHOS 58 93 63  BILITOT 0.6 1.0 1.1  PROT 6.6 8.9* 5.5*  ALBUMIN 3.4* 4.3 2.5*   No results for input(s): LIPASE, AMYLASE in the last 8760 hours. No results for input(s): AMMONIA in the last 8760 hours. CBC: Recent Labs    11/02/17 1439  11/06/17 0513 11/07/17 0505 11/08/17 0610  WBC 6.6    < > 4.9 3.8* 4.1  NEUTROABS 5.1  --   --  2.1 2.1  HGB 16.7*   < > 12.3 12.8 11.2*  HCT 52.4*   < > 38.0 39.5 33.4*  MCV 100.2*   < > 95.7 95.4 94.6  PLT 235   < > 133* 154 172   < > = values in this interval not displayed.   Cardiac Enzymes: No results for input(s): CKTOTAL, CKMB, CKMBINDEX, TROPONINI in the last 8760 hours. BNP: Invalid input(s): POCBNP No results found for: HGBA1C Lab Results  Component Value Date   TSH 1.352 09/30/2016   No results found for: VITAMINB12 No results found for: FOLATE No results found for: IRON, TIBC, FERRITIN  Imaging and Procedures obtained prior to SNF admission: Dg Chest 2 View  Result Date: 11/02/2017 CLINICAL DATA:  Shortness of breath EXAM: CHEST  2 VIEW COMPARISON:  08/16/2017 FINDINGS: There is a large right pneumothorax measuring greater than 90%. There is leftward shift of the trachea concerning for a tension pneumothorax. The left lung is clear. There is no left pneumothorax. There is no pleural effusion. The heart mediastinum are stable. The osseous structures are unremarkable. IMPRESSION: Large right pneumothorax measuring greater than 90% with leftward shift of the trachea concerning for a tension pneumothorax. Critical Value/emergent results were called by telephone at the time of interpretation on 11/02/2017 at 2:12 pm to Dr. Lorre Nick , who verbally acknowledged these results. Electronically Signed   By: Elige Ko   On: 11/02/2017 14:14   Ct Head Wo Contrast  Result Date: 11/02/2017 CLINICAL DATA:  Altered mental status. EXAM: CT HEAD WITHOUT CONTRAST TECHNIQUE: Contiguous axial images were obtained from the base of the skull through the vertex without intravenous contrast. COMPARISON:  03/10/2017. FINDINGS: Brain: Diffusely enlarged ventricles and subarachnoid spaces. Patchy white matter low density in both cerebral hemispheres. No intracranial hemorrhage, mass  lesion or CT evidence of acute infarction. Vascular: No hyperdense  vessel or unexpected calcification. Skull: Normal. Negative for fracture or focal lesion. Sinuses/Orbits: Status post bilateral cataract extraction. Unremarkable paranasal sinuses. Chronic bilateral mastoid air cell opacification and underdevelopment. Other: None. IMPRESSION: 1. No acute abnormality. 2. Stable cerebral and cerebellar atrophy and chronic small vessel white matter ischemic changes. Electronically Signed   By: Beckie Salts M.D.   On: 11/02/2017 14:33   Dg Chest Port 1 View  Result Date: 11/03/2017 CLINICAL DATA:  Pneumothorax EXAM: PORTABLE CHEST 1 VIEW COMPARISON:  11/03/2017 FINDINGS: Right basilar chest tube unchanged in position. Small right apical pneumothorax slightly improved. No effusion. Lungs are clear without infiltrate effusion or edema. IMPRESSION: Right chest tube remains in place. Improvement in small right apical pneumothorax since earlier today. Electronically Signed   By: Marlan Palau M.D.   On: 11/03/2017 15:40   Dg Chest Port 1 View  Result Date: 11/03/2017 CLINICAL DATA:  Follow-up pneumothorax. EXAM: PORTABLE CHEST 1 VIEW COMPARISON:  Portable chest x-ray of November 02, 2017 FINDINGS: The right apical pneumothorax is again demonstrated and appears stable allowing for differences in positioning. The right chest tube tip projects over the posteromedial aspect of the right ninth rib. There is no significant pleural effusion. The right hemidiaphragm remains elevated. There is air in the right axillary soft tissues. There is no mediastinal shift. There is hazy increased density in the right mid lung more conspicuous today. The left lung is well-expanded and clear. The heart and pulmonary vascularity are normal. There calcification in the wall of the aortic arch. IMPRESSION: Stable approximately 10% right apical pneumothorax. Stable positioning of the right-sided chest tube. Small amount of subcutaneous emphysema in the right axillary region. Hazy increased density in the right  mid to lower lung which is new and may reflect developing infiltrate. No CHF. Thoracic aortic atherosclerosis. Electronically Signed   By: [name]  [place] M.D.   On: 11/03/2017 08:47   Dg Chest Port 1 View  Result Date: 11/02/2017 CLINICAL DATA:  Right pneumothorax status post chest tube insertion. EXAM: PORTABLE CHEST 1 VIEW COMPARISON:  11/02/2017 FINDINGS: Right-sided chest tube has been placed, with significant decrease in right pneumothorax. Small right apical pneumothorax persists, measuring 12 millimeters at the apex. There is persistent elevation of the right hemidiaphragm. Lungs are clear. There is a bone island within the right posterior 5th rib. IMPRESSION: Smaller right pneumothorax following placement of right-sided chest tube. Electronically Signed   By: Norva Pavlov M.D.   On: 11/02/2017 15:54    Assessment/Plan   ICD-10-CM   1. Physical deconditioning R53.81   2. Pneumothorax on right J93.9   3. Vascular dementia without behavioral disturbance F01.50   4. Essential hypertension, benign I10   5. Electrolyte disturbance E87.8    Cont current meds as ordered  PT/OT/ST as ordered  F/u with specialists as scheduled  GOAL: short term rehab then d/c to ALF when medically appropriate. Communicated with pt and nursing.  Will follow  Labs/tests ordered: CXR in 2 weeks; cmp and Mg pending    Faizah Kandler S. Ancil Linsey  [facility] [address] [place], [place] [address] [phone] Cell (Monday-Friday 8 AM - 5 PM) [phone] After 5 PM and follow prompts

## 2017-11-18 ENCOUNTER — Encounter: Payer: Self-pay | Admitting: Adult Health

## 2017-11-18 ENCOUNTER — Non-Acute Institutional Stay (SKILLED_NURSING_FACILITY): Payer: Medicare Other | Admitting: Adult Health

## 2017-11-18 DIAGNOSIS — J189 Pneumonia, unspecified organism: Secondary | ICD-10-CM

## 2017-11-18 DIAGNOSIS — F0150 Vascular dementia without behavioral disturbance: Secondary | ICD-10-CM | POA: Diagnosis not present

## 2017-11-18 DIAGNOSIS — I10 Essential (primary) hypertension: Secondary | ICD-10-CM

## 2017-11-18 DIAGNOSIS — J939 Pneumothorax, unspecified: Secondary | ICD-10-CM

## 2017-11-18 NOTE — Progress Notes (Signed)
[name] Room Number: 117 Place of Service:  SNF (31)    CODE STATUS: dnr  No Known Allergies  Chief Complaint  Patient presents with  . Discharge Note     to ALF     HPI:  She is being discharged to assisted living. She will not need any dme. Her medications will be given at the receiving facility. She will follow up medically with the provider at the assisted living. She will need home health for pt/ot.  She had been hospitalized for a spontaneous pneumothorax. She was admitted to this facility for short term rehab and is now ready for discharge back to facility.    Past Medical History:  Diagnosis Date  . Dementia   . Hearing loss   . Hypertension   . Seizures (HCC)   . Vascular dementia   . Weight loss     History reviewed. No pertinent surgical history.  Social History   Socioeconomic History  . Marital status: Widowed    Spouse name: Not on file  . Number of children: Not on file  . Years of education: Not on file  . Highest education level: Not on file  Social Needs  . Financial resource strain: Not on file  . Food insecurity - worry: Not on file  . Food insecurity - inability: Not on file  . Transportation needs - medical: Not on file  . Transportation needs - non-medical: Not on file  Occupational History  . Not on file  Tobacco Use  . Smoking status: Never Smoker  . Smokeless tobacco: Never Used  Substance and Sexual Activity  . Alcohol use: No  . Drug use: No  . Sexual activity: Not on file  Other Topics Concern  . Not on file  Social History Narrative  . Not on file   Family History  Family history unknown: Yes    VITAL SIGNS BP (!) 162/60   Pulse 100   Temp (!) 97.1 F (36.2 C)   Resp (!) 22   Ht 5' (1.524 m)   Wt 103 lb 9.6 oz (47 kg)   SpO2 93%   BMI 20.23 kg/m    Patient's Medications  New Prescriptions   No medications on file  Previous Medications   ACETAMINOPHEN (TYLENOL) 500 MG  TABLET    Take 1 tablet (500 mg total) by mouth 2 (two) times daily.   ASPIRIN 81 MG CHEWABLE TABLET    Chew 81 mg by mouth daily.   CHOLECALCIFEROL (VITAMIN D) 2000 UNITS TABLET    Take 2,000 Units by mouth daily.   HYPROMELLOSE (ARTIFICIAL TEARS) 0.4 % SOLN    One drop both eyes daily   LEVETIRACETAM (KEPPRA) 250 MG TABLET    Take 250 mg every evening by mouth.   LEVETIRACETAM (KEPPRA) 500 MG TABLET    Take 1 tablet (500 mg total) by mouth every morning.   LORAZEPAM (ATIVAN) 0.5 MG TABLET    Take 0.5 tablets (0.25 mg total) by mouth daily as needed (agitation).   MAGNESIUM OXIDE (MAG-OX) 400 (241.3 MG) MG TABLET    Take 1 tablet (400 mg total) by mouth daily.   MELATONIN 3 MG CAPS    Take 3 mg by mouth at bedtime.   MIRTAZAPINE (REMERON) 15 MG TABLET    Take 7.5 mg by mouth at bedtime.   POLYETHYL GLYCOL-PROPYL GLYCOL (SYSTANE) 0.4-0.3 % SOLN    Place 1 drop into both eyes 2 (  two) times daily.   POTASSIUM CHLORIDE SA (K-DUR,KLOR-CON) 20 MEQ TABLET    Take 20 mEq by mouth daily.   RISPERIDONE (RISPERDAL) 0.5 MG TABLET    Take 0.5 mg by mouth at bedtime.   SENNA-DOCUSATE (SENOKOT-S) 8.6-50 MG TABLET    Take 1 tablet by mouth at bedtime.   TRAMADOL (ULTRAM) 50 MG TABLET    Take 1 tablet (50 mg total) by mouth every 12 (twelve) hours as needed for severe pain.  Modified Medications   No medications on file  Discontinued Medications   No medications on file     SIGNIFICANT DIAGNOSTIC EXAMS  PREVIOUS:   11-02-17: chest x-ray: Large right pneumothorax measuring greater than 90% with leftward shift of the trachea concerning for a tension pneumothorax.  11-02-17: ct of head:  1. No acute abnormality. 2. Stable cerebral and cerebellar atrophy and chronic small vessel white matter ischemic changes.  11-03-17: chest x-ray: Right chest tube remains in place. Improvement in small right apical pneumothorax since earlier today.  11-06-17: chest x-ray:  1.  Stable small right apical pneumothorax.  No  tension component. 2.  New left base infiltrate with small left pleural effusion. 3.  Stable cardiac silhouette.  Aortic atherosclerosis present.  11-07-17: chest x-ray; Stable right pneumothorax. Improved aeration in the left base.  NO NEW EXAMS    LABS REVIEWED: PREVOIUS:   11-02-17: wbc 6.6; hgb 16.7; hct 52.4 ;mcv 100.2; plt 235; glucose 110; bun 67 creat 1.40; k+ 3.5; an++ 164; ca 8.9; liver normal albumin 4.3; urine culture: no growth 11-04-17: glucose 126; bun 24; creat 0.81; k+ 4.1; na++ 152 mag 2.1; phos 1.8  11-06-17: wbc 4.9; hgb 12.3; hct 38.0; mcv 95.7plt 133; glucose 114; bun 9; creat 0.64; k+ 4.7; na++ 141; ca 8.2; liver normal albumin 2.5 11-08-17: wbc 4.1; hgb 11.2; hct 33.4 ;mcv 94.6 plt 172; glucose 82; bun 11; creat 0.66; k+ 3.6; na++ 140; ca 8.0; mag 2.2     NO NEW LABS   Review of Systems  Unable to perform ROS: Dementia (unable to participate)   Physical Exam  Constitutional: She appears well-developed and well-nourished. No distress.  Frail   Neck: No thyromegaly present.  Cardiovascular: Normal rate, regular rhythm and intact distal pulses.  Murmur heard. 1/6  Pulmonary/Chest: Effort normal and breath sounds normal. No respiratory distress.  Abdominal: Soft. Bowel sounds are normal. She exhibits no distension.  Musculoskeletal: Normal range of motion. She exhibits no edema.  Lymphadenopathy:    She has no cervical adenopathy.  Neurological: She is alert.  Skin: Skin is warm and dry. She is not diaphoretic.  Psychiatric: She has a normal mood and affect.     ASSESSMENT/ PLAN:   Patient is being discharged with the following home health services: pt/ot: to evaluate and treat as indicated for gait balance strength adl training    Patient is being discharged with the following durable medical equipment:  None needed   Patient has been advised to f/u with their PCP in 1-2 weeks to bring them up to date on their rehab stay.  Social services at facility was  responsible for arranging this appointment.  Pt was provided with a 30 day supply of prescriptions for medications and refills must be obtained from their PCP.  For controlled substances, a more limited supply may be provided adequate until PCP appointment only.   Medications will be provided by the ALF Narcotics written as follows:  Ativan 0.5 mg tabs #10 Ultram 50 mg  tabs #20    Time spent with the patient the discharge process: 35 minutes; reviewed medical record; discussed with staff her needs for dme and therapy.    Synthia Innocenteborah Makela Niehoff NP [facility]  Contact [phone] Monday through Friday 8am- 5pm  After hours call [phone]

## 2017-11-20 LAB — BASIC METABOLIC PANEL
BUN: 8 (ref 4–21)
CREATININE: 0.6 (ref 0.5–1.1)
GLUCOSE: 82
Potassium: 3.8 (ref 3.4–5.3)
SODIUM: 141 (ref 137–147)

## 2017-11-20 LAB — HEPATIC FUNCTION PANEL
ALK PHOS: 85 (ref 25–125)
ALT: 11 (ref 7–35)
AST: 14 (ref 13–35)
Bilirubin, Total: 0.3

## 2018-06-23 ENCOUNTER — Inpatient Hospital Stay (HOSPITAL_COMMUNITY)
Admission: EM | Admit: 2018-06-23 | Discharge: 2018-07-07 | DRG: 199 | Disposition: A | Discharge status: Skilled Nursing Facility | Payer: Medicare Other | Attending: Internal Medicine | Admitting: Internal Medicine

## 2018-06-23 ENCOUNTER — Encounter (HOSPITAL_COMMUNITY): Payer: Self-pay | Admitting: Emergency Medicine

## 2018-06-23 DIAGNOSIS — N183 Chronic kidney disease, stage 3 (moderate): Secondary | ICD-10-CM | POA: Diagnosis present

## 2018-06-23 DIAGNOSIS — R4781 Slurred speech: Secondary | ICD-10-CM | POA: Diagnosis present

## 2018-06-23 DIAGNOSIS — Z515 Encounter for palliative care: Secondary | ICD-10-CM | POA: Diagnosis not present

## 2018-06-23 DIAGNOSIS — R4182 Altered mental status, unspecified: Secondary | ICD-10-CM | POA: Diagnosis not present

## 2018-06-23 DIAGNOSIS — I10 Essential (primary) hypertension: Secondary | ICD-10-CM | POA: Diagnosis present

## 2018-06-23 DIAGNOSIS — E870 Hyperosmolality and hypernatremia: Secondary | ICD-10-CM | POA: Diagnosis present

## 2018-06-23 DIAGNOSIS — Z79899 Other long term (current) drug therapy: Secondary | ICD-10-CM

## 2018-06-23 DIAGNOSIS — Z4682 Encounter for fitting and adjustment of non-vascular catheter: Secondary | ICD-10-CM

## 2018-06-23 DIAGNOSIS — F0150 Vascular dementia without behavioral disturbance: Secondary | ICD-10-CM | POA: Diagnosis present

## 2018-06-23 DIAGNOSIS — E43 Unspecified severe protein-calorie malnutrition: Secondary | ICD-10-CM | POA: Diagnosis present

## 2018-06-23 DIAGNOSIS — J9383 Other pneumothorax: Principal | ICD-10-CM | POA: Diagnosis present

## 2018-06-23 DIAGNOSIS — Z66 Do not resuscitate: Secondary | ICD-10-CM | POA: Diagnosis present

## 2018-06-23 DIAGNOSIS — Z7982 Long term (current) use of aspirin: Secondary | ICD-10-CM

## 2018-06-23 DIAGNOSIS — N179 Acute kidney failure, unspecified: Secondary | ICD-10-CM | POA: Diagnosis present

## 2018-06-23 DIAGNOSIS — G40909 Epilepsy, unspecified, not intractable, without status epilepticus: Secondary | ICD-10-CM | POA: Diagnosis present

## 2018-06-23 DIAGNOSIS — H9190 Unspecified hearing loss, unspecified ear: Secondary | ICD-10-CM | POA: Diagnosis present

## 2018-06-23 DIAGNOSIS — G9341 Metabolic encephalopathy: Secondary | ICD-10-CM | POA: Diagnosis present

## 2018-06-23 DIAGNOSIS — E860 Dehydration: Secondary | ICD-10-CM | POA: Diagnosis present

## 2018-06-23 DIAGNOSIS — I129 Hypertensive chronic kidney disease with stage 1 through stage 4 chronic kidney disease, or unspecified chronic kidney disease: Secondary | ICD-10-CM | POA: Diagnosis present

## 2018-06-23 DIAGNOSIS — E876 Hypokalemia: Secondary | ICD-10-CM | POA: Diagnosis not present

## 2018-06-23 DIAGNOSIS — J939 Pneumothorax, unspecified: Secondary | ICD-10-CM

## 2018-06-23 DIAGNOSIS — Z681 Body mass index (BMI) 19 or less, adult: Secondary | ICD-10-CM

## 2018-06-23 NOTE — ED Triage Notes (Signed)
BIB [facility] with c/o of AMS. Per EMS pt was walking and talking at the facility then found unresponsive in bed. Pt alert but not oriented on arrival. Hx of dementia.

## 2018-06-24 ENCOUNTER — Emergency Department (HOSPITAL_COMMUNITY): Payer: Medicare Other

## 2018-06-24 ENCOUNTER — Encounter (HOSPITAL_COMMUNITY): Payer: Self-pay

## 2018-06-24 DIAGNOSIS — N183 Chronic kidney disease, stage 3 (moderate): Secondary | ICD-10-CM | POA: Diagnosis present

## 2018-06-24 DIAGNOSIS — F0150 Vascular dementia without behavioral disturbance: Secondary | ICD-10-CM | POA: Diagnosis present

## 2018-06-24 DIAGNOSIS — N179 Acute kidney failure, unspecified: Secondary | ICD-10-CM | POA: Diagnosis present

## 2018-06-24 DIAGNOSIS — R4182 Altered mental status, unspecified: Secondary | ICD-10-CM | POA: Diagnosis present

## 2018-06-24 DIAGNOSIS — Z4682 Encounter for fitting and adjustment of non-vascular catheter: Secondary | ICD-10-CM

## 2018-06-24 DIAGNOSIS — E43 Unspecified severe protein-calorie malnutrition: Secondary | ICD-10-CM | POA: Diagnosis present

## 2018-06-24 DIAGNOSIS — G9341 Metabolic encephalopathy: Secondary | ICD-10-CM | POA: Diagnosis present

## 2018-06-24 DIAGNOSIS — E870 Hyperosmolality and hypernatremia: Secondary | ICD-10-CM

## 2018-06-24 DIAGNOSIS — I129 Hypertensive chronic kidney disease with stage 1 through stage 4 chronic kidney disease, or unspecified chronic kidney disease: Secondary | ICD-10-CM | POA: Diagnosis present

## 2018-06-24 DIAGNOSIS — J939 Pneumothorax, unspecified: Secondary | ICD-10-CM | POA: Diagnosis not present

## 2018-06-24 DIAGNOSIS — E876 Hypokalemia: Secondary | ICD-10-CM | POA: Diagnosis not present

## 2018-06-24 DIAGNOSIS — H9190 Unspecified hearing loss, unspecified ear: Secondary | ICD-10-CM | POA: Diagnosis present

## 2018-06-24 DIAGNOSIS — Z515 Encounter for palliative care: Secondary | ICD-10-CM | POA: Diagnosis not present

## 2018-06-24 DIAGNOSIS — Z79899 Other long term (current) drug therapy: Secondary | ICD-10-CM | POA: Diagnosis not present

## 2018-06-24 DIAGNOSIS — G40909 Epilepsy, unspecified, not intractable, without status epilepticus: Secondary | ICD-10-CM | POA: Diagnosis present

## 2018-06-24 DIAGNOSIS — E860 Dehydration: Secondary | ICD-10-CM | POA: Diagnosis present

## 2018-06-24 DIAGNOSIS — J9383 Other pneumothorax: Secondary | ICD-10-CM | POA: Diagnosis present

## 2018-06-24 DIAGNOSIS — Z7982 Long term (current) use of aspirin: Secondary | ICD-10-CM | POA: Diagnosis not present

## 2018-06-24 DIAGNOSIS — Z66 Do not resuscitate: Secondary | ICD-10-CM | POA: Diagnosis present

## 2018-06-24 DIAGNOSIS — R4781 Slurred speech: Secondary | ICD-10-CM | POA: Diagnosis present

## 2018-06-24 DIAGNOSIS — Z681 Body mass index (BMI) 19 or less, adult: Secondary | ICD-10-CM | POA: Diagnosis not present

## 2018-06-24 LAB — I-STAT CG4 LACTIC ACID, ED
Lactic Acid, Venous: 1.03 mmol/L (ref 0.5–1.9)
Lactic Acid, Venous: 1.61 mmol/L (ref 0.5–1.9)

## 2018-06-24 LAB — COMPREHENSIVE METABOLIC PANEL
ALT: 12 U/L (ref 0–44)
AST: 22 U/L (ref 15–41)
Albumin: 3.4 g/dL — ABNORMAL LOW (ref 3.5–5.0)
Alkaline Phosphatase: 74 U/L (ref 38–126)
Anion gap: 9 (ref 5–15)
BUN: 21 mg/dL (ref 8–23)
CHLORIDE: 110 mmol/L (ref 98–111)
CO2: 27 mmol/L (ref 22–32)
Calcium: 9.5 mg/dL (ref 8.9–10.3)
Creatinine, Ser: 1.08 mg/dL — ABNORMAL HIGH (ref 0.44–1.00)
GFR, EST AFRICAN AMERICAN: 49 mL/min — AB (ref >60)
GFR, EST NON AFRICAN AMERICAN: 42 mL/min — AB (ref >60)
Glucose, Bld: 130 mg/dL — ABNORMAL HIGH (ref 70–99)
POTASSIUM: 4 mmol/L (ref 3.5–5.1)
SODIUM: 146 mmol/L — AB (ref 135–145)
Total Bilirubin: 0.5 mg/dL (ref 0.3–1.2)
Total Protein: 7.2 g/dL (ref 6.5–8.1)

## 2018-06-24 LAB — CBC
HEMATOCRIT: 41.8 % (ref 36.0–46.0)
HEMOGLOBIN: 12.8 g/dL (ref 12.0–15.0)
MCH: 29.8 pg (ref 26.0–34.0)
MCHC: 30.6 g/dL (ref 30.0–36.0)
MCV: 97.4 fL (ref 78.0–100.0)
Platelets: 178 10*3/uL (ref 150–400)
RBC: 4.29 MIL/uL (ref 3.87–5.11)
RDW: 14.6 % (ref 11.5–15.5)
WBC: 5.7 10*3/uL (ref 4.0–10.5)

## 2018-06-24 LAB — URINALYSIS, ROUTINE W REFLEX MICROSCOPIC
Bilirubin Urine: NEGATIVE
GLUCOSE, UA: NEGATIVE mg/dL
HGB URINE DIPSTICK: NEGATIVE
Ketones, ur: NEGATIVE mg/dL
Leukocytes, UA: NEGATIVE
Nitrite: NEGATIVE
Protein, ur: NEGATIVE mg/dL
Specific Gravity, Urine: 1.02 (ref 1.005–1.030)
pH: 6 (ref 5.0–8.0)

## 2018-06-24 LAB — CBG MONITORING, ED: Glucose-Capillary: 107 mg/dL — ABNORMAL HIGH (ref 70–99)

## 2018-06-24 LAB — MRSA PCR SCREENING: MRSA by PCR: NEGATIVE

## 2018-06-24 LAB — TSH: TSH: 1.186 u[IU]/mL (ref 0.350–4.500)

## 2018-06-24 LAB — VITAMIN B12: Vitamin B-12: 316 pg/mL (ref 180–914)

## 2018-06-24 LAB — TROPONIN I

## 2018-06-24 LAB — GLUCOSE, CAPILLARY: Glucose-Capillary: 74 mg/dL (ref 70–99)

## 2018-06-24 LAB — AMMONIA: Ammonia: 14 umol/L (ref 9–35)

## 2018-06-24 MED ORDER — SENNOSIDES-DOCUSATE SODIUM 8.6-50 MG PO TABS
1.0000 | ORAL_TABLET | Freq: Every day | ORAL | Status: DC
  Filled 2018-06-24: qty 1

## 2018-06-24 MED ORDER — MIDAZOLAM HCL 2 MG/2ML IJ SOLN
0.5000 mg | Freq: Once | INTRAMUSCULAR | Status: AC
  Administered 2018-06-24: 0.5 mg via INTRAVENOUS
  Filled 2018-06-24: qty 2

## 2018-06-24 MED ORDER — LIDOCAINE HCL (PF) 1 % IJ SOLN
30.0000 mL | Freq: Once | INTRAMUSCULAR | Status: AC
  Administered 2018-06-24: 30 mL
  Filled 2018-06-24: qty 30

## 2018-06-24 MED ORDER — LEVETIRACETAM ER 500 MG PO TB24
500.0000 mg | ORAL_TABLET | Freq: Every day | ORAL | Status: DC
  Filled 2018-06-24: qty 1

## 2018-06-24 MED ORDER — POLYVINYL ALCOHOL 1.4 % OP SOLN
1.0000 [drp] | Freq: Two times a day (BID) | OPHTHALMIC | Status: DC
  Administered 2018-06-25 – 2018-07-07 (×23): 1 [drp] via OPHTHALMIC
  Filled 2018-06-24 (×2): qty 15

## 2018-06-24 MED ORDER — HYDROMORPHONE HCL 1 MG/ML IJ SOLN
0.5000 mg | Freq: Once | INTRAMUSCULAR | Status: AC
  Administered 2018-06-24: 0.5 mg via INTRAVENOUS
  Filled 2018-06-24: qty 1

## 2018-06-24 MED ORDER — VITAMIN D 1000 UNITS PO TABS
2000.0000 [IU] | ORAL_TABLET | Freq: Every day | ORAL | Status: DC
  Administered 2018-06-25: 2000 [IU] via ORAL
  Filled 2018-06-24: qty 2

## 2018-06-24 MED ORDER — SODIUM CHLORIDE 0.9 % IV SOLN
INTRAVENOUS | Status: DC
  Administered 2018-06-24 (×2): via INTRAVENOUS

## 2018-06-24 MED ORDER — LORAZEPAM 2 MG/ML IJ SOLN
0.2500 mg | Freq: Four times a day (QID) | INTRAMUSCULAR | Status: DC | PRN
  Administered 2018-06-24 – 2018-06-27 (×3): 0.25 mg via INTRAVENOUS
  Filled 2018-06-24 (×4): qty 1

## 2018-06-24 MED ORDER — LEVETIRACETAM IN NACL 500 MG/100ML IV SOLN
500.0000 mg | INTRAVENOUS | Status: AC
  Administered 2018-06-24 – 2018-06-29 (×6): 500 mg via INTRAVENOUS
  Filled 2018-06-24 (×8): qty 100

## 2018-06-24 MED ORDER — RISPERIDONE 0.25 MG PO TABS: 0.2500 mg | ORAL_TABLET | Freq: Every day | ORAL | Status: DC

## 2018-06-24 MED ORDER — SODIUM CHLORIDE 0.45 % IV SOLN: INTRAVENOUS | Status: DC

## 2018-06-24 MED ORDER — ACETAMINOPHEN 500 MG PO TABS
500.0000 mg | ORAL_TABLET | Freq: Two times a day (BID) | ORAL | Status: DC
  Administered 2018-06-25: 500 mg via ORAL
  Filled 2018-06-24 (×2): qty 1

## 2018-06-24 MED ORDER — LEVETIRACETAM 250 MG PO TABS: 250.0000 mg | ORAL_TABLET | Freq: Every day | ORAL | Status: DC

## 2018-06-24 MED ORDER — MORPHINE SULFATE (PF) 2 MG/ML IV SOLN: 1.0000 mg | INTRAVENOUS | Status: DC | PRN

## 2018-06-24 MED ORDER — POTASSIUM CHLORIDE CRYS ER 20 MEQ PO TBCR
20.0000 meq | EXTENDED_RELEASE_TABLET | Freq: Every day | ORAL | Status: DC
  Administered 2018-06-25: 20 meq via ORAL
  Filled 2018-06-24: qty 1

## 2018-06-24 MED ORDER — SODIUM CHLORIDE 0.9 % IV BOLUS
500.0000 mL | Freq: Once | INTRAVENOUS | Status: AC
  Administered 2018-06-24: 500 mL via INTRAVENOUS

## 2018-06-24 MED ORDER — MAGNESIUM OXIDE 400 (241.3 MG) MG PO TABS
400.0000 mg | ORAL_TABLET | Freq: Every day | ORAL | Status: DC
  Administered 2018-06-25: 400 mg via ORAL
  Filled 2018-06-24: qty 1

## 2018-06-24 MED ORDER — LEVETIRACETAM 250 MG PO TABS: 250.0000 mg | ORAL_TABLET | Freq: Every evening | ORAL | Status: DC

## 2018-06-24 MED ORDER — LEVETIRACETAM 500 MG PO TABS: 500.0000 mg | ORAL_TABLET | Freq: Every day | ORAL | Status: DC

## 2018-06-24 MED ORDER — ENOXAPARIN SODIUM 30 MG/0.3ML ~~LOC~~ SOLN
30.0000 mg | SUBCUTANEOUS | Status: DC
  Administered 2018-06-24 – 2018-07-07 (×14): 30 mg via SUBCUTANEOUS
  Filled 2018-06-24 (×14): qty 0.3

## 2018-06-24 MED ORDER — HYDROMORPHONE HCL 1 MG/ML IJ SOLN
0.5000 mg | Freq: Once | INTRAMUSCULAR | Status: AC
  Administered 2018-06-24: 0.5 mg via INTRAVENOUS

## 2018-06-24 MED ORDER — SODIUM CHLORIDE 0.9 % IV SOLN
250.0000 mg | INTRAVENOUS | Status: DC
  Administered 2018-06-25 – 2018-06-28 (×5): 250 mg via INTRAVENOUS
  Filled 2018-06-24 (×6): qty 2.5

## 2018-06-24 MED ORDER — MIRTAZAPINE 7.5 MG PO TABS: 7.5000 mg | ORAL_TABLET | Freq: Every day | ORAL | Status: DC

## 2018-06-24 MED ORDER — FUROSEMIDE 20 MG PO TABS: 20.0000 mg | ORAL_TABLET | ORAL | Status: DC

## 2018-06-24 MED ORDER — MIDAZOLAM HCL 2 MG/2ML IJ SOLN
1.0000 mg | Freq: Once | INTRAMUSCULAR | Status: AC
  Administered 2018-06-24: 1 mg via INTRAVENOUS

## 2018-06-24 NOTE — ED Notes (Signed)
Attempted report 

## 2018-06-24 NOTE — H&P (Addendum)
TRH H&P   Patient Demographics:    Jocabed Cheese, is a 84 y.o. female  MRN: [id number]   DOB - January 25, 1921  Admit Date - 06/23/2018  Outpatient Primary MD for the patient is Mortimer Fries, [place]  Referring MD: Dr. Manus Gunning  Outpatient Specialists: None  Patient coming from: Skilled nursing facility [facility])  Chief Complaint  Patient presents with  . Altered Mental Status    History obtained from ED notes.  Patient is somnolent and unable to provide any history.  HPI:    Ameirah Khatoon  is a 84 y.o. female, with a history of vascular dementia (moderate to severe), seizure disorder, hypertension, history of spontaneous pneumothorax (had tension pneumothorax in February this year), brought in to med [employer] [facility] by EMS from SNF with altered mental status.  She was last seen to be normal yesterday.  Per the staff at SNF she had slurring of speech (speaks normally at baseline).  No fevers, chills, nausea, vomiting, complains of dysuria, abdominal pain, diarrhea or shortness of breath.  No witnessed fall or trauma.  No recent seizures.  At baseline she needs assistance with her ADLs. In the ED she was found to have normal vitals with decreased breath sounds on the right side.  Patient was not following any commands no meningeal signs are noted. Labs showed normal CBC, sodium of 146, normal potassium, BUN of 21 and creatinine of 1.08.  Glucose of 130.  Lactic acid was normal. Chest x-ray showed moderate to large right-sided pneumothorax increasing size since her last chest x-ray in February this year with a hazy perihilar and lower lung opacity suspecting atelectasis.  Head CT negative for acute findings.  Critical care consulted and patient had a right chest pigtail catheter placed.  Patient admitted to stepdown.   Review of systems:    As outlined in history of present illness.   Review of system limited due to patient's encephalopathy.   With Past History of the following :    Past Medical History:  Diagnosis Date  . Dementia   . Hearing loss   . Hypertension   . Seizures (HCC)   . Vascular dementia   . Weight loss       History reviewed. No pertinent surgical history.    Social History:     Social History   Tobacco Use  . Smoking status: Never Smoker  . Smokeless tobacco: Never Used  Substance Use Topics  . Alcohol use: No     Lives -at skilled nursing facility. Mobility -ambulates with assistance.     Family History :     Family History  No known family history of heart disease, stroke or diabetes.      Home Medications:   Prior to Admission medications   Medication Sig Start Date End Date Taking? Authorizing Provider  acetaminophen (TYLENOL) 500 MG tablet  Take 1 tablet (500 mg total) by mouth 2 (two) times daily. 11/08/17  Yes Albertine GratesXu, Fang, MD  acetaminophen (TYLENOL) 500 MG tablet Take 1,000 mg by mouth every 6 (six) hours as needed for mild pain or fever.   Yes [provider]  aspirin 81 MG chewable tablet Chew 81 mg by mouth daily.   Yes [provider]  Cholecalciferol (VITAMIN D) 2000 units tablet Take 2,000 Units by mouth daily.   Yes [provider]  furosemide (LASIX) 20 MG tablet Take 20 mg by mouth See admin instructions. On Monday, Wednesday, Saturday   Yes [provider]  levETIRAcetam (KEPPRA) 250 MG tablet Take 250 mg every evening by mouth.   Yes [provider]  levETIRAcetam (KEPPRA) 500 MG tablet Take 1 tablet (500 mg total) by mouth every morning. Patient taking differently: Take 500 mg by mouth daily.  11/08/17  Yes Albertine GratesXu, Fang, MD  LORazepam (ATIVAN) 0.5 MG tablet Take 0.5 tablets (0.25 mg total) by mouth daily as needed (agitation). 11/08/17  Yes Albertine GratesXu, Fang, MD  magnesium oxide (MAG-OX) 400 (241.3 Mg) MG tablet Take 1 tablet (400 mg total) by mouth daily. 11/08/17  Yes Albertine GratesXu, Fang,  MD  Melatonin 3 MG CAPS Take 3 mg by mouth at bedtime.   Yes [provider]  mirtazapine (REMERON) 15 MG tablet Take 7.5 mg by mouth at bedtime.   Yes [provider]  Polyethyl Glycol-Propyl Glycol (SYSTANE) 0.4-0.3 % SOLN Place 1 drop into both eyes 2 (two) times daily.   Yes [provider]  potassium chloride SA (K-DUR,KLOR-CON) 20 MEQ tablet Take 20 mEq by mouth daily.   Yes [provider]  risperiDONE (RISPERDAL) 0.25 MG tablet Take 0.25 mg by mouth at bedtime.   Yes [provider]  senna-docusate (SENOKOT-S) 8.6-50 MG tablet Take 1 tablet by mouth at bedtime. Patient taking differently: Take 1 tablet by mouth daily.  11/08/17  Yes Albertine GratesXu, Fang, MD  traMADol (ULTRAM) 50 MG tablet Take 1 tablet (50 mg total) by mouth every 12 (twelve) hours as needed for severe pain. 11/08/17  Yes Albertine GratesXu, Fang, MD  UNABLE TO FIND Take 1 each by mouth daily. Med Name: Medpass   Yes [provider]     Allergies:    No Known Allergies   Physical Exam:   Vitals  Blood pressure (!) 183/83, pulse 80, temperature 97.7 F (36.5 C), temperature source Axillary, resp. rate 16, SpO2 100 %.  General: Elderly female lying in bed somnolent, moaning to physical stimuli HEENT: Pupils reactive bilaterally, no pallor, no icterus, moist mucosa, supple neck Chest: Right-sided pigtail catheter, diminished breath sounds on the right, normal breath sounds on the left, no rhonchi or wheeze CVS: Normal S1 and S2, no murmurs rub or gallop GI: Soft, nondistended, nontender, bowel sounds present Musculoskeletal: Warm, no edema, normal skin, moving all extremities CNS: Somnolent, moaning to physical stimuli only   Data Review:    CBC Recent Labs  Lab 06/23/18 2353  WBC 5.7  HGB 12.8  HCT 41.8  PLT 178  MCV 97.4  MCH 29.8  MCHC 30.6  RDW 14.6    ------------------------------------------------------------------------------------------------------------------  Chemistries  Recent Labs  Lab 06/23/18 2353  NA 146*  K 4.0  CL 110  CO2 27  GLUCOSE 130*  BUN 21  CREATININE 1.08*  CALCIUM 9.5  AST 22  ALT 12  ALKPHOS 74  BILITOT 0.5   ------------------------------------------------------------------------------------------------------------------ CrCl cannot be calculated (Unknown ideal weight.). ------------------------------------------------------------------------------------------------------------------ No results  for input(s): TSH, T4TOTAL, T3FREE, THYROIDAB in the last 72 hours.  Invalid input(s): FREET3  Coagulation profile No results for input(s): INR, PROTIME in the last 168 hours. ------------------------------------------------------------------------------------------------------------------- No results for input(s): DDIMER in the last 72 hours. -------------------------------------------------------------------------------------------------------------------  Cardiac Enzymes Recent Labs  Lab 06/24/18 0130  TROPONINI <0.03   ------------------------------------------------------------------------------------------------------------------ No results found for: BNP   ---------------------------------------------------------------------------------------------------------------  Urinalysis    Component Value Date/Time   COLORURINE YELLOW 06/24/2018 0452   APPEARANCEUR CLEAR 06/24/2018 0452   LABSPEC 1.020 06/24/2018 0452   PHURINE 6.0 06/24/2018 0452   GLUCOSEU NEGATIVE 06/24/2018 0452   HGBUR NEGATIVE 06/24/2018 0452   BILIRUBINUR NEGATIVE 06/24/2018 0452   KETONESUR NEGATIVE 06/24/2018 0452   PROTEINUR NEGATIVE 06/24/2018 0452   UROBILINOGEN 0.2 04/30/2015 2118   NITRITE NEGATIVE 06/24/2018 0452   LEUKOCYTESUR NEGATIVE 06/24/2018 0452     ----------------------------------------------------------------------------------------------------------------   Imaging Results:    Dg Chest 1 View  Result Date: 06/24/2018 CLINICAL DATA:  Altered mental status EXAM: CHEST  1 VIEW COMPARISON:  11/07/2017 chest radiograph. FINDINGS: Stable cardiomediastinal silhouette with normal heart size. Moderate to large right pneumothorax. No left pneumothorax. No pleural effusion. Hazy parahilar and lower right lung opacity. Clear left lung. IMPRESSION: Moderate to large right pneumothorax, significantly increased in size since the most recent chest radiograph of 11/07/2017. Hazy parahilar and lower right lung opacity, favor atelectasis. No definite contralateral mediastinal shift to suggest tension. Critical Value/emergent results were called by telephone at the time of interpretation on 06/24/2018 at 1:30 am to Dr. Glynn Octave , who verbally acknowledged these results. Electronically Signed   By: Delbert Phenix M.D.   On: 06/24/2018 01:32   Ct Head Wo Contrast  Result Date: 06/24/2018 CLINICAL DATA:  84 year old female with altered mental status. EXAM: CT HEAD WITHOUT CONTRAST TECHNIQUE: Contiguous axial images were obtained from the base of the skull through the vertex without intravenous contrast. COMPARISON:  Head CT dated 11/02/2017 FINDINGS: Brain: There is age-related atrophy and chronic microvascular ischemic changes. There is no acute intracranial hemorrhage. No mass effect or midline shift. No extra-axial fluid collection. Vascular: No hyperdense vessel or unexpected calcification. Skull: Normal. Negative for fracture or focal lesion. Sinuses/Orbits: The visualized paranasal sinuses are clear. There is chronic bilateral mastoid opacification and underdevelopment. Other: None IMPRESSION: 1. No acute intracranial pathology. 2. Age-related atrophy and chronic microvascular ischemic changes. Electronically Signed   By: Elgie Collard M.D.   On:  06/24/2018 01:39   Dg Chest Port 1 View  Result Date: 06/24/2018 CLINICAL DATA:  Follow-up examination status post chest tube adjustment. EXAM: PORTABLE CHEST 1 VIEW COMPARISON:  Prior radiograph from earlier the same day. FINDINGS: Stable cardiomegaly. Mediastinal silhouette unchanged. Aortic atherosclerosis. Right-sided chest tube again seen with tip now overlying the mid right upper lobe. Probable trace residual right apical pneumothorax, decreased from previous exam. Persistent bibasilar atelectatic changes. Right hemidiaphragm is elevated. No other new cardiopulmonary abnormality. Osseous structures unchanged. Soft tissue emphysema within the right chest wall related to chest tube placement. IMPRESSION: 1. Right-sided chest tube in place with tip now overlying the mid right upper lobe. Trace residual right apical pneumothorax, decreased from previous. 2. Persistent mild bibasilar atelectatic changes. No other new active cardiopulmonary disease. Electronically Signed   By: Rise Mu M.D.   On: 06/24/2018 04:47   Dg Chest Portable 1 View  Result Date: 06/24/2018 CLINICAL DATA:  Initial evaluation for right-sided chest tube placement. EXAM: PORTABLE CHEST 1 VIEW COMPARISON:  Prior radiograph from earlier the  same day. FINDINGS: Stable cardiomegaly. Mediastinal silhouette within normal limits. Aortic atherosclerosis. Interval placement of right-sided pigtail chest tube with tip overlying the medial right upper lobe. Interval rule read span shin of previously seen right-sided pneumothorax. Residual small pneumothorax seen at the right lung apex, pleural edge overlying the inferior margin of the right third rib. Hazy and linear right basilar opacity likely reflects atelectatic lung. Additional atelectatic changes at the left lung base. Mild perihilar vascular congestion without frank pulmonary edema. Osseous structures are unchanged. Soft tissue emphysema within the right chest wall related to  chest tube placement. IMPRESSION: 1. Interval placement of pigtail right-sided chest tube with tip overlying the medial right upper lobe. Previously seen right-sided pneumothorax has largely re-expanded, with small residual right apical pneumothorax as above. 2. Hazy bibasilar opacities, likely atelectasis. 3. Cardiomegaly with perihilar vascular congestion without frank pulmonary edema. Electronically Signed   By: Rise Mu M.D.   On: 06/24/2018 03:30    My personal review of EKG: Normal sinus rhythm at 82 with LAFB, prolonged QTC of 504.   Assessment & Plan:    Principal Problem:   Recurrent spontaneous pneumothorax Right-sided chest tube (pigtail) placed by PCCM in the ED.  Monitor in stepdown.  Follow-up chest x-ray showed trace residual right apical pneumothorax decreased from previous.  Also shows persistent mild bibasilar atelectatic changes. Further management per pulmonary.  Active Problems: Acute metabolic encephalopathy No clinical signs of infection.  Ammonia was normal.  Follow blood cultures.  Normal UA.  Cannot rule out aspiration.  Will hold off on antibiotic for now.  She is getting low-dose morphine PRN for pain following chest tube insertion.  Had also received a dose of Dilaudid and Versed in the ED.  Will avoid benzos and minimize narcotics as much as possible.  Hold home dose of Risperdal and and Remeron. Check TSH and B12.   Hypernatremia Mild.  Will start on gentle hydration.  Seizure disorder Switch Keppra to IV.    Essential hypertension, benign Blood pressure currently stable.  Monitor for now.  Holding home medications.      DVT Prophylaxis: Lovenox  AM Labs Ordered, also please review Full Orders  Family Communication: Admission, patients condition and plan of care including tests being ordered have been discussed with daughter on the phone  Code Status DNR.  (Family was okay with chest tube placement)  Likely DC to  SNF  Condition  GUARDED    Consults called: PCCM following  Admission status: Inpatient  Time spent in minutes : 70   English Craighead M.D on 06/24/2018 at 8:26 AM  Between 7am to 7pm - Pager - [phone]. After 7pm go to [email or web address] - password [facility]  [facility] - Office  [phone]

## 2018-06-24 NOTE — ED Provider Notes (Signed)
[facility] EMERGENCY DEPARTMENT Provider Note   CSN: [id number] Arrival date & time: 06/23/18  2332     History   Chief Complaint Chief Complaint  Patient presents with  . Altered Mental Status    HPI Isabella [name] is a 84 y.o. female.  Level 5 caveat for dementia and altered mental status.  Patient brought by EMS from her nursing home with change in mental status.  Last seen normal was yesterday.  Discussed with staff at [facility] who states patient was slurring her words when she normally speaks clearly.  The last time she was speaking normally was yesterday.  She does have a history of dementia and is normally confused.  They deny any recent falls or trauma.  Patient does have a history of seizures but they deny any recent seizures.  No known fever, chills, nausea, vomiting, chest pain or shortness of breath.  Patient unable to give any history.  The history is provided by the patient, a relative, the EMS personnel and the nursing home. The history is limited by the condition of the patient.  Altered Mental Status      Past Medical History:  Diagnosis Date  . Dementia   . Hearing loss   . Hypertension   . Seizures (HCC)   . Vascular dementia   . Weight loss     Patient Active Problem List   Diagnosis Date Noted  . Essential hypertension, benign 11/10/2017  . Vascular dementia without behavioral disturbance 11/10/2017  . Slow transit constipation 11/10/2017  . Hypokalemia 11/10/2017  . Hypomagnesemia 11/10/2017  . Chronic generalized pain disorder 11/10/2017  . Psychosis in elderly 11/10/2017  . Palliative care encounter   . Acute respiratory failure (HCC)   . Pneumothorax on right 11/02/2017  . Hypernatremia 11/02/2017  . CAP (community acquired pneumonia) 09/29/2016  . Altered mental status 09/27/2016  . Seizures (HCC) 09/27/2016    History reviewed. No pertinent surgical history.   OB History   None      Home Medications     Prior to Admission medications   Medication Sig Start Date End Date Taking? Authorizing Provider  acetaminophen (TYLENOL) 500 MG tablet Take 1 tablet (500 mg total) by mouth 2 (two) times daily. 11/08/17   Albertine Grates, MD  aspirin 81 MG chewable tablet Chew 81 mg by mouth daily.    [provider]  Cholecalciferol (VITAMIN D) 2000 units tablet Take 2,000 Units by mouth daily.    [provider]  Hypromellose (ARTIFICIAL TEARS) 0.4 % SOLN One drop both eyes daily    [provider]  levETIRAcetam (KEPPRA) 250 MG tablet Take 250 mg every evening by mouth.    [provider]  levETIRAcetam (KEPPRA) 500 MG tablet Take 1 tablet (500 mg total) by mouth every morning. 11/08/17   Albertine Grates, MD  LORazepam (ATIVAN) 0.5 MG tablet Take 0.5 tablets (0.25 mg total) by mouth daily as needed (agitation). 11/08/17   Albertine Grates, MD  magnesium oxide (MAG-OX) 400 (241.3 Mg) MG tablet Take 1 tablet (400 mg total) by mouth daily. 11/08/17   Albertine Grates, MD  Melatonin 3 MG CAPS Take 3 mg by mouth at bedtime.    [provider]  mirtazapine (REMERON) 15 MG tablet Take 7.5 mg by mouth at bedtime.    [provider]  Polyethyl Glycol-Propyl Glycol (SYSTANE) 0.4-0.3 % SOLN Place 1 drop into both eyes 2 (two) times daily.    [provider]  potassium  chloride SA (K-DUR,KLOR-CON) 20 MEQ tablet Take 20 mEq by mouth daily.    [provider]  risperiDONE (RISPERDAL) 0.5 MG tablet Take 0.5 mg by mouth at bedtime.    [provider]  senna-docusate (SENOKOT-S) 8.6-50 MG tablet Take 1 tablet by mouth at bedtime. 11/08/17   Albertine Grates, MD  traMADol (ULTRAM) 50 MG tablet Take 1 tablet (50 mg total) by mouth every 12 (twelve) hours as needed for severe pain. 11/08/17   Albertine Grates, MD    Family History Family History  Family history unknown: Yes    Social History Social History   Tobacco Use  . Smoking status: Never Smoker  . Smokeless tobacco: Never Used   Substance Use Topics  . Alcohol use: No  . Drug use: No     Allergies   Patient has no known allergies.   Review of Systems Review of Systems  Unable to perform ROS: Dementia     Physical Exam Updated Vital Signs BP (!) 183/83 (BP Location: Right Arm)   Pulse 80   Temp 97.7 F (36.5 C) (Axillary)   Resp 16   SpO2 100%   Physical Exam  Constitutional: She appears well-developed and well-nourished. No distress.  HENT:  Head: Normocephalic and atraumatic.  Mouth/Throat: Oropharynx is clear and moist. No oropharyngeal exudate.  Eyes: Pupils are equal, round, and reactive to light. Conjunctivae and EOM are normal.  Irregular pupils bilaterally  Neck: Normal range of motion. Neck supple.  No meningismus.  Cardiovascular: Normal rate, regular rhythm, normal heart sounds and intact distal pulses.  No murmur heard. Pulmonary/Chest: Effort normal and breath sounds normal. No respiratory distress. She exhibits no tenderness.  Decreased breath sounds on R  Abdominal: Soft. There is no tenderness. There is no rebound and no guarding.  Musculoskeletal: Normal range of motion. She exhibits no edema or tenderness.  Neurological: She is alert. No cranial nerve deficit. She exhibits normal muscle tone. Coordination normal.  Patient is alert, unable to answer any questions.  She moves all extremities spontaneously, does not follow commands  Skin: Skin is warm. Capillary refill takes less than 2 seconds. No rash noted.  Psychiatric: She has a normal mood and affect. Her behavior is normal.  Nursing note and vitals reviewed.    ED Treatments / Results  Labs (all labs ordered are listed, but only abnormal results are displayed) Labs Reviewed  COMPREHENSIVE METABOLIC PANEL - Abnormal; Notable for the following components:      Result Value   Sodium 146 (*)    Glucose, Bld 130 (*)    Creatinine, Ser 1.08 (*)    Albumin 3.4 (*)    GFR calc non Af Amer 42 (*)    GFR calc Af Amer 49  (*)    All other components within normal limits  CBG MONITORING, ED - Abnormal; Notable for the following components:   Glucose-Capillary 107 (*)    All other components within normal limits  CULTURE, BLOOD (ROUTINE X 2)  CULTURE, BLOOD (ROUTINE X 2)  URINE CULTURE  MRSA PCR SCREENING  CBC  URINALYSIS, ROUTINE W REFLEX MICROSCOPIC  TROPONIN I  AMMONIA  I-STAT CG4 LACTIC ACID, ED  I-STAT CG4 LACTIC ACID, ED    EKG EKG Interpretation  Date/Time:  Tuesday June 23 2018 23:41:39 EDT Ventricular Rate:  82 PR Interval:    QRS Duration: 85 QT Interval:  431 QTC Calculation: 504 R Axis:   -50 Text Interpretation:  Sinus rhythm Left anterior fascicular block  Consider anterior infarct Nonspecific T abnormalities, lateral leads Prolonged QT interval No significant change was found Confirmed by Glynn Octave [phone]) on 06/24/2018 1:42:10 AM   Radiology Dg Chest 1 View  Result Date: 06/24/2018 CLINICAL DATA:  Altered mental status EXAM: CHEST  1 VIEW COMPARISON:  11/07/2017 chest radiograph. FINDINGS: Stable cardiomediastinal silhouette with normal heart size. Moderate to large right pneumothorax. No left pneumothorax. No pleural effusion. Hazy parahilar and lower right lung opacity. Clear left lung. IMPRESSION: Moderate to large right pneumothorax, significantly increased in size since the most recent chest radiograph of 11/07/2017. Hazy parahilar and lower right lung opacity, favor atelectasis. No definite contralateral mediastinal shift to suggest tension. Critical Value/emergent results were called by telephone at the time of interpretation on 06/24/2018 at 1:30 am to Dr. Glynn Octave , who verbally acknowledged these results. Electronically Signed   By: Delbert Phenix M.D.   On: 06/24/2018 01:32   Ct Head Wo Contrast  Result Date: 06/24/2018 CLINICAL DATA:  84 year old female with altered mental status. EXAM: CT HEAD WITHOUT CONTRAST TECHNIQUE: Contiguous axial images were  obtained from the base of the skull through the vertex without intravenous contrast. COMPARISON:  Head CT dated 11/02/2017 FINDINGS: Brain: There is age-related atrophy and chronic microvascular ischemic changes. There is no acute intracranial hemorrhage. No mass effect or midline shift. No extra-axial fluid collection. Vascular: No hyperdense vessel or unexpected calcification. Skull: Normal. Negative for fracture or focal lesion. Sinuses/Orbits: The visualized paranasal sinuses are clear. There is chronic bilateral mastoid opacification and underdevelopment. Other: None IMPRESSION: 1. No acute intracranial pathology. 2. Age-related atrophy and chronic microvascular ischemic changes. Electronically Signed   By: Elgie Collard M.D.   On: 06/24/2018 01:39   Dg Chest Port 1 View  Result Date: 06/24/2018 CLINICAL DATA:  Follow-up examination status post chest tube adjustment. EXAM: PORTABLE CHEST 1 VIEW COMPARISON:  Prior radiograph from earlier the same day. FINDINGS: Stable cardiomegaly. Mediastinal silhouette unchanged. Aortic atherosclerosis. Right-sided chest tube again seen with tip now overlying the mid right upper lobe. Probable trace residual right apical pneumothorax, decreased from previous exam. Persistent bibasilar atelectatic changes. Right hemidiaphragm is elevated. No other new cardiopulmonary abnormality. Osseous structures unchanged. Soft tissue emphysema within the right chest wall related to chest tube placement. IMPRESSION: 1. Right-sided chest tube in place with tip now overlying the mid right upper lobe. Trace residual right apical pneumothorax, decreased from previous. 2. Persistent mild bibasilar atelectatic changes. No other new active cardiopulmonary disease. Electronically Signed   By: Rise Mu M.D.   On: 06/24/2018 04:47   Dg Chest Portable 1 View  Result Date: 06/24/2018 CLINICAL DATA:  Initial evaluation for right-sided chest tube placement. EXAM: PORTABLE CHEST 1  VIEW COMPARISON:  Prior radiograph from earlier the same day. FINDINGS: Stable cardiomegaly. Mediastinal silhouette within normal limits. Aortic atherosclerosis. Interval placement of right-sided pigtail chest tube with tip overlying the medial right upper lobe. Interval rule read span shin of previously seen right-sided pneumothorax. Residual small pneumothorax seen at the right lung apex, pleural edge overlying the inferior margin of the right third rib. Hazy and linear right basilar opacity likely reflects atelectatic lung. Additional atelectatic changes at the left lung base. Mild perihilar vascular congestion without frank pulmonary edema. Osseous structures are unchanged. Soft tissue emphysema within the right chest wall related to chest tube placement. IMPRESSION: 1. Interval placement of pigtail right-sided chest tube with tip overlying the medial right upper lobe. Previously seen right-sided pneumothorax has largely re-expanded, with small residual  right apical pneumothorax as above. 2. Hazy bibasilar opacities, likely atelectasis. 3. Cardiomegaly with perihilar vascular congestion without frank pulmonary edema. Electronically Signed   By: Rise Mu M.D.   On: 06/24/2018 03:30    Procedures CHEST TUBE INSERTION Date/Time: 06/24/2018 3:18 AM Performed by: Glynn Octave, MD Authorized by: Glynn Octave, MD   Consent:    Consent obtained:  Verbal and emergent situation   Consent given by:  Guardian   Risks discussed:  Incomplete drainage, bleeding, damage to surrounding structures, infection, nerve damage and pain   Alternatives discussed:  No treatment Pre-procedure details:    Skin preparation:  ChloraPrep   Preparation: Patient was prepped and draped in the usual sterile fashion   Sedation:    Sedation type:  Deep Anesthesia (see MAR for exact dosages):    Anesthesia method:  Local infiltration   Local anesthetic:  Lidocaine 1% w/o epi Procedure details:    Placement  location:  R lateral   Scalpel size:  11   Tube size (Fr):  Minicatheter   Ultrasound guidance: no     Tension pneumothorax: no     Tube connected to:  Suction and water seal   Drainage characteristics:  Air only   Suture material:  0 silk   Dressing:  4x4 sterile gauze and Xeroform gauze Post-procedure details:    Post-insertion x-ray findings: tube in good position     Patient tolerance of procedure:  Tolerated well, no immediate complications .Sedation Date/Time: 06/24/2018 3:22 AM Performed by: Glynn Octave, MD Authorized by: Glynn Octave, MD   Consent:    Consent obtained:  Emergent situation and verbal   Consent given by:  Guardian   Risks discussed:  Prolonged hypoxia resulting in organ damage, prolonged sedation necessitating reversal, respiratory compromise necessitating ventilatory assistance and intubation, inadequate sedation and dysrhythmia   Alternatives discussed:  Analgesia without sedation Universal protocol:    Procedure explained and questions answered to patient or proxy's satisfaction: yes     Relevant documents present and verified: yes     Test results available and properly labeled: yes     Imaging studies available: yes     Site/side marked: yes     Immediately prior to procedure a time out was called: yes   Indications:    Procedure performed:  Chest tube placement   Procedure necessitating sedation performed by:  Physician performing sedation   Intended level of sedation:  Deep Pre-sedation assessment:    Time since last food or drink:  4   NPO status caution: unable to specify NPO status     ASA classification: class 3 - patient with severe systemic disease     Neck mobility: reduced     Mouth opening:  3 or more finger widths   Thyromental distance:  4 finger widths   Mallampati score:  II - soft palate, uvula, fauces visible   Pre-sedation assessments completed and reviewed: airway patency, cardiovascular function and mental status      Pre-sedation assessment completed:  06/24/2018 3:00 AM Immediate pre-procedure details:    Reassessment: Patient reassessed immediately prior to procedure     Reviewed: vital signs, relevant labs/tests and NPO status     Verified: bag valve mask available, emergency equipment available, intubation equipment available, IV patency confirmed and oxygen available   Procedure details (see MAR for exact dosages):    Preoxygenation:  Nasal cannula   Sedation:  Midazolam   Analgesia:  Hydromorphone   Intra-procedure monitoring:  Blood pressure  monitoring, continuous capnometry, continuous pulse oximetry and cardiac monitor   Intra-procedure events: none     Total Provider sedation time (minutes):  15 Post-procedure details:    Post-sedation assessment completed:  06/24/2018 3:23 AM   Attendance: Constant attendance by certified staff until patient recovered     Recovery: Patient returned to pre-procedure baseline     Post-sedation assessments completed and reviewed: airway patency, cardiovascular function and nausea/vomiting     Patient is stable for discharge or admission: yes     Patient tolerance:  Tolerated well, no immediate complications   (including critical care time)  Medications Ordered in ED Medications - No data to display   Initial Impression / Assessment and Plan / ED Course  I have reviewed the triage vital signs and the nursing notes.  Pertinent labs & imaging results that were available during my care of the patient were reviewed by me and considered in my medical decision making (see chart for details).    Patient sent from living facility with change in mental status, last seen normal yesterday.  Living facility states patient was having slurred speech and seemed different than her normal baseline.  Altered mental status work-up pursued.  Chest x-ray shows large right-sided pneumothorax.  Patient did have this in February and requiring a chest tube.  She does not have  any hypoxia or respiratory distress. Discussed with patient's daughter Darleen Moffitt who confirms DNR status but is agreeable to chest tube if necessary.  Discussed with Dr. Cornelius Moras of thoracic surgery.  He states he would only recommend a pigtail catheter if the family was willing.  He defers to pulmonology since they managed the pneumothorax last time.  Chest tube placed as above.  Lung risk reexpansion noted.  Altered mental status work-up is otherwise reassuring.  Head CT is stable.  Urinalysis is negative.  Labs are reassuring.  Patient will be admitted for altered mental status as well as management of her pneumothorax.    Discussed with critical care Dr. Carlota Raspberry as well as Dr. Antionette Char of the hospitalist service.  CRITICAL CARE Performed by: Glynn Octave Total critical care time: 35 minutes Critical care time was exclusive of separately billable procedures and treating other patients. Critical care was necessary to treat or prevent imminent or life-threatening deterioration. Critical care was time spent personally by me on the following activities: development of treatment plan with patient and/or surrogate as well as nursing, discussions with consultants, evaluation of patient's response to treatment, examination of patient, obtaining history from patient or surrogate, ordering and performing treatments and interventions, ordering and review of laboratory studies, ordering and review of radiographic studies, pulse oximetry and re-evaluation of patient's condition.  Final Clinical Impressions(s) / ED Diagnoses   Final diagnoses:  Recurrent spontaneous pneumothorax  Altered mental status, unspecified altered mental status type    ED Discharge Orders    None       Coryn Mosso, Jeannett Senior, MD 06/24/18 [phone]

## 2018-06-24 NOTE — Progress Notes (Signed)
..   NAME:  Isabella [name], MRN:  [id number], DOB:  01-20-1921, LOS: 0 ADMISSION DATE:  06/23/2018, CONSULTATION DATE:  06/24/2018 REFERRING MD:  Manus Gunning MD, CHIEF COMPLAINT:  Altered mental status   Brief History   84 yr old patient with PMHx of spontaneous Tension PTX in February presents to [facility] on [facility]/25 with Altered mental status, unresponsive at facility, on evaluation found to have a Right sided PTX with no signs of tension. Hemodynamically stable. PCCM consulted for mgmt. CT placed by EDP  Significant Hospital Events   Right sided Pneumothorax on presentation  Consults: date of consult/date signed off & final recs:  PCCM  Procedures (surgical and bedside):  Chest Tube (pigtail)- 06/24/18  Significant Diagnostic Tests:    Subjective:  Sleeping this afternoon, drowsy.   Objective   Blood pressure (!) 161/74, pulse 75, temperature 98 F (36.7 C), temperature source Axillary, resp. rate 16, weight 47.7 kg, SpO2 99 %.        Intake/Output Summary (Last 24 hours) at 06/24/2018 1332 Last data filed at 06/24/2018 1100 Gross per 24 hour  Intake 555.12 ml  Output 50 ml  Net 505.12 ml   Filed Weights   06/24/18 0842  Weight: 47.7 kg    Examination: General: frail elderly female HENT: [place]/AT, Temporal wasting.  Lungs: clear bilateral Cardiovascular: RRR Abdomen: Soft, non-tender, non-distended Extremities: thin extremities no gross abnormalities no edema Neuro: drowsy  Resolved Hospital Problem list     Assessment & Plan:  84 yr old female with PMHx significant for Vascular Dementia, HTN, Failure to thrive, severe malnutrition, hypernatremia from dehydration, seizures, and CKD stage III  Right sided pneumothorax without tension s/p chest tube placement - pigtail catheter placed 9/25 early AM. Remains on 20cm H20. Continue tonight and repeat CXR in AM.  - Continue on supplemental O2 via [place] to keep O2 sats >92% - Recurring pneumothorax in elderly patient - if pt has a  persistent leak or failure to wean from chest tube she may require pleurodesis.  Altered mental status ( h/o vascular dementia) - per primary  Of note pt is DNR in the event of cardiac or pulmonary arrest.  Limited interventions as specified: 1.  readmit to the hospital but not ICU 2.  DNI 3.  Antibiotics OK. 4.  IVFs for a defined trial period  5.  No feeding tube  [facility] Daughter Landree Fernholz     Disposition / Summary of Today's Plan 06/24/18   Telemetry/ keep CT to 20cmH20 and repeat CXR in AM    Diet: Crushed with puree( Aspiration risk)- pt has a h/o dysphagia DVT prophylaxis: defer to admitting hospitalist GI prophylaxis: not indicated at this time Hyperglycemia protocol: no h/o insulin dependence. Mobility: bedrest Code Status: DNR Family Communication: Daughter is NOK. Harlyn Rathmann updated by EDP  Labs   CBC: Recent Labs  Lab 06/23/18 2353  WBC 5.7  HGB 12.8  HCT 41.8  MCV 97.4  PLT 178    Basic Metabolic Panel: Recent Labs  Lab 06/23/18 2353  NA 146*  K 4.0  CL 110  CO2 27  GLUCOSE 130*  BUN 21  CREATININE 1.08*  CALCIUM 9.5   GFR: CrCl cannot be calculated (Unknown ideal weight.). Recent Labs  Lab 06/23/18 2353 06/24/18 0027 06/24/18 0148  WBC 5.7  --   --   LATICACIDVEN  --  1.61 1.03    Liver Function Tests: Recent Labs  Lab 06/23/18 2353  AST 22  ALT 12  ALKPHOS  74  BILITOT 0.5  PROT 7.2  ALBUMIN 3.4*   No results for input(s): LIPASE, AMYLASE in the last 168 hours. Recent Labs  Lab 06/24/18 0130  AMMONIA 14    ABG    Component Value Date/Time   TCO2 24 03/15/2016 1118     Coagulation Profile: No results for input(s): INR, PROTIME in the last 168 hours.  Cardiac Enzymes: Recent Labs  Lab 06/24/18 0130  TROPONINI <0.03    HbA1C: No results found for: HGBA1C  CBG: Recent Labs  Lab 06/24/18 0034  GLUCAP 107*    Admitting History of Present Illness.   84 yr old female with PMHx significant for  vascular Dementia, HTN, Failure to thrive, severe malnutrition, hypernatremia from dehydration, seizures, and CKD stage III [facility] with increasing altered mental status, they stated that the pt had slurred speech and seemed different from baseline and was found unresponsive in bed at facility. [facility] was alert but not oriented.  Received workup including a CXR which showed large right sided pneumothorax. PCCM consulted to assist in management.  Joneen RoachPaul Martese Vanatta, AGACNP-BC [facility] Pager [phone] or [phone]  06/24/2018 1:33 PM

## 2018-06-24 NOTE — Progress Notes (Signed)
PCCM Interval Progress Note  PCXR reviewed post right chest pigtail catheter placement.  Catheter pulled back / withdrawn ~ 2.5 - 3cm as pt would tolerate (lots of discomfort). Repeat CXR in for 0700.   Isabella [name], [place] - [facility] Pager: [phone]  or [phone] 06/24/2018, 3:47 AM

## 2018-06-24 NOTE — Consult Note (Addendum)
..   NAME:  Azaya Goedde, MRN:  [id number], DOB:  [birth year], LOS: 0 ADMISSION DATE:  06/23/2018, CONSULTATION DATE:  06/24/2018 REFERRING MD:  Manus Gunning MD, CHIEF COMPLAINT:  Altered mental status   Brief History   84 yr old patient with PMHx of spontaneous Tension PTX in February presents to [facility] on 9/25 with Altered mental status, unresponsive at facility, on evaluation found to have a Right sided PTX with no signs of tension. Hemodynamically stable. PCCM consulted for mgmt. CT placed by EDP  Significant Hospital Events   Right sided Pneumothorax on presentation  Consults: date of consult/date signed off & final recs:  PCCM  Procedures (surgical and bedside):  Chest Tube (pigtail)- 06/24/18  Significant Diagnostic Tests:  CXR: Moderate to large right pneumothorax, significantly increased in size since the most recent chest radiograph of 11/07/2017. Hazy parahilar and lower right lung opacity, favor atelectasis. No definite contralateral mediastinal shift to suggest tension  Subjective:  Pt alert and awake not working to breathe. Difficult to obtain further subjective details as patient is severely demented and as documented in EMR has worsening AMS.  Objective   Blood pressure (!) 134/92, pulse 70, temperature 97.7 F (36.5 C), temperature source Axillary, resp. rate 11, SpO2 100 %.       No intake or output data in the 24 hours ending 06/24/18 0311 There were no vitals filed for this visit.  Examination: General: thin undernourished female HENT: temporal wasting dry oral mucosa Lungs: decreased breath sounds on right  Cardiovascular: S1 and S2 appreciated increased rate Abdomen: soft scaphoid non distended with + BS Extremities: thin extremities no gross abnormalities no edema Neuro: no focal deficits  Resolved Hospital Problem list   Right sided pneumothorax s/p chest tube- resolved 0255  Assessment & Plan:  84 yr old female with PMHx significant for Vascular Dementia,  HTN, Failure to thrive, severe malnutrition, hypernatremia from dehydration, seizures, and CKD stage III Active Issues: Right sided pneumothorax without tension s/p chest tube placement Altered mental status ( h/o vascular dementia)  Plan: Pigtail catheter placed- I was present for procedure preformed by EDP Reviewed CXR post CT insertion and lung re-expansion appreciated- pigtail may need repositioning Continue chest tube via pleur-vac @-20 cmH20 suction Continue on supplemental O2 via [place] to keep O2 sats >92% Recurring pneumothorax in elderly patient - if pt has a persistent leak or failure to wean from chest tube she may require pleurodesis.  Of note pt is DNR in the event of cardiac or pulmonary arrest.  Limited interventions as specified: 1.  readmit to the hospital but not ICU 2.  DNI 3.  Antibiotics OK. 4.  IVFs for a defined trial period  5.  No feeding tube  [facility] Daughter Kevin Fenton     Disposition / Summary of Today's Plan 06/24/18   Admit to Hospitalist Pulmonary consult following    Diet: Crushed with puree( Aspiration risk)- pt has a h/o dysphagia Pain/Anxiety/Delirium protocol (if indicated): yes VAP protocol (if indicated): not intubated not intiated DVT prophylaxis: defer to admitting hospitalist GI prophylaxis: not indicated at this time Hyperglycemia protocol: no h/o insulin dependence. Mobility: bedrest Code Status: DNR Family Communication: Daughter is NOK. Verlee Pope updated by EDP  Labs   CBC: Recent Labs  Lab 06/23/18 2353  WBC 5.7  HGB 12.8  HCT 41.8  MCV 97.4  PLT 178    Basic Metabolic Panel: Recent Labs  Lab 06/23/18 2353  NA 146*  K 4.0  CL 110  CO2 27  GLUCOSE 130*  BUN 21  CREATININE 1.08*  CALCIUM 9.5   GFR: CrCl cannot be calculated (Unknown ideal weight.). Recent Labs  Lab 06/23/18 2353 06/24/18 0027 06/24/18 0148  WBC 5.7  --   --   LATICACIDVEN  --  1.61 1.03    Liver Function Tests: Recent Labs  Lab  06/23/18 2353  AST 22  ALT 12  ALKPHOS 74  BILITOT 0.5  PROT 7.2  ALBUMIN 3.4*   No results for input(s): LIPASE, AMYLASE in the last 168 hours. Recent Labs  Lab 06/24/18 0130  AMMONIA 14    ABG    Component Value Date/Time   TCO2 24 03/15/2016 1118     Coagulation Profile: No results for input(s): INR, PROTIME in the last 168 hours.  Cardiac Enzymes: Recent Labs  Lab 06/24/18 0130  TROPONINI <0.03    HbA1C: No results found for: HGBA1C  CBG: Recent Labs  Lab 06/24/18 0034  GLUCAP 107*    Admitting History of Present Illness.   84 yr old female with PMHx significant for vascular Dementia, HTN, Failure to thrive, severe malnutrition, hypernatremia from dehydration, seizures, and CKD stage III presents from [facility] with increasing altered mental status, they stated that the pt had slurred speech and seemed different from baseline and was found unresponsive in bed at facility. On arrival to [facility] she was alert but not oriented.  Received workup including a CXR which showed large right sided pneumothorax. PCCM consulted to assist in management.  Review of Systems:   Marland KitchenMarland KitchenReview of Systems  Unable to perform ROS: Dementia    Past Medical History  She,  has a past medical history of Dementia, Hearing loss, Hypertension, Seizures (HCC), Vascular dementia, and Weight loss.   Surgical History   History reviewed. No pertinent surgical history.   Social History   Social History   Socioeconomic History  . Marital status: Widowed    Spouse name: Not on file  . Number of children: Not on file  . Years of education: Not on file  . Highest education level: Not on file  Occupational History  . Not on file  Social Needs  . Financial resource strain: Not on file  . Food insecurity:    Worry: Not on file    Inability: Not on file  . Transportation needs:    Medical: Not on file    Non-medical: Not on file  Tobacco Use  . Smoking status: Never Smoker  .  Smokeless tobacco: Never Used  Substance and Sexual Activity  . Alcohol use: No  . Drug use: No  . Sexual activity: Not on file  Lifestyle  . Physical activity:    Days per week: Not on file    Minutes per session: Not on file  . Stress: Not on file  Relationships  . Social connections:    Talks on phone: Not on file    Gets together: Not on file    Attends religious service: Not on file    Active member of club or organization: Not on file    Attends meetings of clubs or organizations: Not on file    Relationship status: Not on file  . Intimate partner violence:    Fear of current or ex partner: Not on file    Emotionally abused: Not on file    Physically abused: Not on file    Forced sexual activity: Not on file  Other Topics Concern  . Not on file  Social History Narrative  . Not on file  ,  reports that she has never smoked. She has never used smokeless tobacco. She reports that she does not drink alcohol or use drugs.   Family History   Her Family history is unknown by patient.   Allergies No Known Allergies   Home Medications  Prior to Admission medications   Medication Sig Start Date End Date Taking? Authorizing Provider  acetaminophen (TYLENOL) 500 MG tablet Take 1 tablet (500 mg total) by mouth 2 (two) times daily. 11/08/17  Yes Albertine Grates, MD  acetaminophen (TYLENOL) 500 MG tablet Take 1,000 mg by mouth every 6 (six) hours as needed for mild pain or fever.   Yes [provider]  aspirin 81 MG chewable tablet Chew 81 mg by mouth daily.   Yes [provider]  Cholecalciferol (VITAMIN D) 2000 units tablet Take 2,000 Units by mouth daily.   Yes [provider]  furosemide (LASIX) 20 MG tablet Take 20 mg by mouth See admin instructions. On Monday, Wednesday, Saturday   Yes [provider]  levETIRAcetam (KEPPRA) 250 MG tablet Take 250 mg every evening by mouth.   Yes [provider]  levETIRAcetam (KEPPRA) 500 MG tablet Take 1  tablet (500 mg total) by mouth every morning. Patient taking differently: Take 500 mg by mouth daily.  11/08/17  Yes Albertine Grates, MD  LORazepam (ATIVAN) 0.5 MG tablet Take 0.5 tablets (0.25 mg total) by mouth daily as needed (agitation). 11/08/17  Yes Albertine Grates, MD  magnesium oxide (MAG-OX) 400 (241.3 Mg) MG tablet Take 1 tablet (400 mg total) by mouth daily. 11/08/17  Yes Albertine Grates, MD  Melatonin 3 MG CAPS Take 3 mg by mouth at bedtime.   Yes [provider]  mirtazapine (REMERON) 15 MG tablet Take 7.5 mg by mouth at bedtime.   Yes [provider]  Polyethyl Glycol-Propyl Glycol (SYSTANE) 0.4-0.3 % SOLN Place 1 drop into both eyes 2 (two) times daily.   Yes [provider]  potassium chloride SA (K-DUR,KLOR-CON) 20 MEQ tablet Take 20 mEq by mouth daily.   Yes [provider]  risperiDONE (RISPERDAL) 0.25 MG tablet Take 0.25 mg by mouth at bedtime.   Yes [provider]  senna-docusate (SENOKOT-S) 8.6-50 MG tablet Take 1 tablet by mouth at bedtime. Patient taking differently: Take 1 tablet by mouth daily.  11/08/17  Yes Albertine Grates, MD  traMADol (ULTRAM) 50 MG tablet Take 1 tablet (50 mg total) by mouth every 12 (twelve) hours as needed for severe pain. 11/08/17  Yes Albertine Grates, MD  UNABLE TO FIND Take 1 each by mouth daily. Med Name: Medpass   Yes [provider]      I, Dr Newell Coral have personally reviewed patient's available data, including medical history, events of note, physical examination and test results as part of my evaluation. I have discussed with other care providers such as EDPand Elink.  In addition,  I personally evaluated patient  The patient required high complexity decision making for assessment and support, pulmonary evaluation and initiation of therapies, application of advanced monitoring technologies and extensive interpretation of multiple databases.   Pulmonary Consult Time devoted to patient care services described in this note  is  55 Minutes. This time reflects time of care of this signee Dr Newell Coral. This critical care time does not reflect procedure time, or teaching time or supervisory time but could involve care discussion time   CC TIME: 55  minutes   Dr. Newell Coral Pulmonary Critical Care Medicine  06/24/2018 3:42 AM

## 2018-06-25 ENCOUNTER — Inpatient Hospital Stay (HOSPITAL_COMMUNITY): Payer: Medicare Other

## 2018-06-25 LAB — BASIC METABOLIC PANEL
Anion gap: 5 (ref 5–15)
BUN: 10 mg/dL (ref 8–23)
CHLORIDE: 112 mmol/L — AB (ref 98–111)
CO2: 25 mmol/L (ref 22–32)
Calcium: 8.2 mg/dL — ABNORMAL LOW (ref 8.9–10.3)
Creatinine, Ser: 0.62 mg/dL (ref 0.44–1.00)
GFR calc Af Amer: >60 mL/min (ref >60)
GFR calc non Af Amer: >60 mL/min (ref >60)
Glucose, Bld: 79 mg/dL (ref 70–99)
Potassium: 2.8 mmol/L — ABNORMAL LOW (ref 3.5–5.1)
SODIUM: 142 mmol/L (ref 135–145)

## 2018-06-25 LAB — URINE CULTURE: CULTURE: NO GROWTH

## 2018-06-25 LAB — MAGNESIUM: Magnesium: 1.7 mg/dL (ref 1.7–2.4)

## 2018-06-25 MED ORDER — POTASSIUM CHLORIDE 10 MEQ/100ML IV SOLN
10.0000 meq | INTRAVENOUS | Status: AC
  Administered 2018-06-25 (×4): 10 meq via INTRAVENOUS
  Filled 2018-06-25 (×3): qty 100

## 2018-06-25 MED ORDER — MORPHINE SULFATE (PF) 2 MG/ML IV SOLN: 0.5000 mg | INTRAVENOUS | Status: DC | PRN

## 2018-06-25 MED ORDER — POTASSIUM CHLORIDE 10 MEQ/100ML IV SOLN: 10.0000 meq | INTRAVENOUS | Status: DC

## 2018-06-25 NOTE — Clinical Social Work Note (Signed)
Clinical Social Work Assessment  Patient Details  Name: Isabella [name] MRN: [id number] Date of Birth: 24-Jun-1921  Date of referral:  06/25/18               Reason for consult:  Facility Placement, Discharge Planning                Permission sought to share information with:  Facility Sport and exercise psychologist, Family Supports Permission granted to share information::  No(patient not oriented)  Name::     Isabella [name]  Agency::  [facility]  Relationship::  daughter  Contact Information:  [phone]  Housing/Transportation Living arrangements for the past 2 months:  [place] of Information:  Adult Children, Medical Team Patient Interpreter Needed:  None Criminal Activity/Legal Involvement Pertinent to Current Situation/Hospitalization:  No - Comment as needed Significant Relationships:  Adult Children Lives with:  Facility Resident Do you feel safe going back to the place where you live?  Yes Need for family participation in patient care:  Yes (Comment)  Care giving concerns: Patient from [place]. Continued medical workup. CSW to follow for disposition needs.   Social Worker assessment / plan: CSW met with patient and patient's daughter, Isabella [name], at bedside. Patient not oriented, though was alert and interacting with RNs. CSW introduced self and role and discussed disposition planning. Daughter indicated patient has lived at [place] for approximately three years. Daughter reported that at baseline, patient ambulates independently without mobility aids. Daughter also reported that ALF staff provide some minimal assistance with dressing and bathing, and patient can feed herself independently. Daughter reported that patient is normally fully oriented.   CSW explained that patient may require PT/OT evaluation, and if needing more assistance with mobility, may be recommended for SNF. Daughter expressed understanding. CSW will follow for continued medical workup  and will support with disposition planning.   Employment status:  Retired Forensic scientist:  Medicare PT Recommendations:  Not assessed at this time Information / Referral to community resources:     Patient/Family's Response to care: Patient daughter appreciative of care.   Patient/Family's Understanding of and Emotional Response to Diagnosis, Current Treatment, and Prognosis: Patient's daughter with understanding of patient's condition.  Emotional Assessment Appearance:  Appears stated age Attitude/Demeanor/Rapport:  Other(pleasantly confused) Affect (typically observed):  Calm, Pleasant Orientation:  Oriented to Place Alcohol / Substance use:  Not Applicable Psych involvement (Current and /or in the community):  No (Comment)  Discharge Needs  Concerns to be addressed:  Discharge Planning Concerns, Care Coordination Readmission within the last 30 days:  No Current discharge risk:  Physical Impairment, Cognitively Impaired Barriers to Discharge:  Continued Medical Work up   Estanislado Emms, LCSW 06/25/2018, 1:42 PM

## 2018-06-25 NOTE — Progress Notes (Signed)
Labs resulted, potassium 2.8. [facility] notified via pager. Awaiting replacement orders.

## 2018-06-25 NOTE — Progress Notes (Signed)
..   NAME:  Carmel [place], MRN:  [id number], DOB:  11-Aug-1921, LOS: 1 ADMISSION DATE:  06/23/2018, CONSULTATION DATE:  06/24/2018 REFERRING MD:  Manus Gunning MD, CHIEF COMPLAINT:  Altered mental status   Brief History   84 yr old patient with PMHx of spontaneous Tension PTX in February presents to [facility] on 9/25 with Altered mental status, unresponsive at facility, on evaluation found to have a Right sided PTX with no signs of tension. Hemodynamically stable. PCCM consulted for mgmt. CT placed by EDP. Follow up CXR with some mild residual apical pneumothorax.   Significant Hospital Events   Right sided Pneumothorax on presentation  Consults: date of consult/date signed off & final recs:  PCCM  Procedures (surgical and bedside):  Chest Tube (pigtail)- 06/24/18  Significant Diagnostic Tests:    Subjective:  Alert and agitated today. No complaints.    Objective   Blood pressure (!) 154/78, pulse 67, temperature (!) 97.4 F (36.3 C), temperature source Axillary, resp. rate 15, weight 49.8 kg, SpO2 96 %.        Intake/Output Summary (Last 24 hours) at 06/25/2018 1431 Last data filed at 06/25/2018 1319 Gross per 24 hour  Intake 2395.4 ml  Output 120 ml  Net 2275.4 ml   Filed Weights   06/24/18 0842 06/25/18 0549  Weight: 47.7 kg 49.8 kg    Examination: General:  Frail elderly female in NAD Neuro:  Spontaneously awake, alert, agitated. Trying to climb out of bed. Confused.  HEENT:  [place]/AT, No JVD noted, PERRL Cardiovascular:  RRR, no MRG Lungs:  Clear bilateral Abdomen:  Soft, non-distended Musculoskeletal:  No acute deformity Skin:  Intact, MMM   Resolved Hospital Problem list     Assessment & Plan:  84 yr old female with PMHx significant for Vascular Dementia, HTN, Failure to thrive, severe malnutrition, hypernatremia from dehydration, seizures, and CKD stage III  Right sided pneumothorax without tension s/p chest tube placement - pigtail catheter placed 9/25 early AM. - Change  to water seal and repeat CXR in a few hours. Then again in AM. - Continue on supplemental O2 via [place] to keep O2 sats >92% - Recurring pneumothorax in elderly patient - if pt has a persistent leak or failure to wean from chest tube she may require pleurodesis.  Altered mental status ( h/o vascular dementia) - per primary  Of note pt is DNR in the event of cardiac or pulmonary arrest.  Limited interventions as specified: 1.  readmit to the hospital but not ICU 2.  DNI 3.  Antibiotics OK. 4.  IVFs for a defined trial period  5.  No feeding tube  [facility] Daughter Kevin Fenton     Disposition / Summary of Today's Plan 06/25/18   Drop suction to waterseal. Repeat CXR tonight and in AM.     Diet: Crushed with puree( Aspiration risk)- pt has a h/o dysphagia DVT prophylaxis: defer to admitting hospitalist GI prophylaxis: not indicated at this time Hyperglycemia protocol: no h/o insulin dependence. Mobility: bedrest Code Status: DNR Family Communication: Daughter is NOK. Pearly Bartosik updated by EDP  Labs   CBC: Recent Labs  Lab 06/23/18 2353  WBC 5.7  HGB 12.8  HCT 41.8  MCV 97.4  PLT 178    Basic Metabolic Panel: Recent Labs  Lab 06/23/18 2353 06/25/18 0346  NA 146* 142  K 4.0 2.8*  CL 110 112*  CO2 27 25  GLUCOSE 130* 79  BUN 21 10  CREATININE 1.08* 0.62  CALCIUM 9.5 8.2*  MG  --  1.7   GFR: CrCl cannot be calculated (Unknown ideal weight.). Recent Labs  Lab 06/23/18 2353 06/24/18 0027 06/24/18 0148  WBC 5.7  --   --   LATICACIDVEN  --  1.61 1.03    Liver Function Tests: Recent Labs  Lab 06/23/18 2353  AST 22  ALT 12  ALKPHOS 74  BILITOT 0.5  PROT 7.2  ALBUMIN 3.4*   No results for input(s): LIPASE, AMYLASE in the last 168 hours. Recent Labs  Lab 06/24/18 0130  AMMONIA 14    ABG    Component Value Date/Time   TCO2 24 03/15/2016 1118     Coagulation Profile: No results for input(s): INR, PROTIME in the last 168 hours.  Cardiac  Enzymes: Recent Labs  Lab 06/24/18 0130  TROPONINI <0.03    HbA1C: No results found for: HGBA1C  CBG: Recent Labs  Lab 06/24/18 0034 06/24/18 1441  GLUCAP 107* 74    Admitting History of Present Illness.   84 yr old female with PMHx significant for vascular Dementia, HTN, Failure to thrive, severe malnutrition, hypernatremia from dehydration, seizures, and CKD stage III presents from [facility] with increasing altered mental status, they stated that the pt had slurred speech and seemed different from baseline and was found unresponsive in bed at facility. On arrival to [facility] she was alert but not oriented.  Received workup including a CXR which showed large right sided pneumothorax. PCCM consulted to assist in management.   Joneen Roach, AGACNP-BC [facility] Pulmonology/Critical Care Pager [phone] or [phone]  06/25/2018 2:31 PM

## 2018-06-25 NOTE — Progress Notes (Addendum)
PROGRESS NOTE [facility]   Isabella [name]   ZOX:[id number] DOB: September 28, 1921  DOA: 06/23/2018 PCP: Mortimer Fries, PA   Brief Narrative:  Isabella [name] 84 year old female with medical history of severe dementia, seizure disorder, hypertension, history of recurrent spontaneous pneumothorax was sent from SNF to the emergency department due to altered mental status and lethargy.  Upon ED evaluation patient was found to have decreased breath sounds on the right side, hemodynamically stable and chest x-ray showed large right-sided pneumothorax.  Head CT was negative for acute finding.  Patient was admitted with working diagnosis of recurrent spontaneous pneumothorax.  PCCM was admitted in place a chest tube.  Subjective: Patient seen and examined, she is alert but confused.  Has no complaints.  Assessment & Plan: Recurrent spontaneous pneumothorax Had admission on February 2019 for tension pneumothorax, now back with spontaneous non-tension pneumothorax.  PCCM place at right side chest tube.  Changed to waterseal today.  Monitor chest x-rays daily.  Continue management per pulmonary.  Acute metabolic encephalopathy in setting of severe dementia No signs of infection, x-ray with no signs of aspiration.  Patient more alert today.  Unknown baseline.  Avoid sedating agents.  Continue to monitor closely  Hypernatremia Likely due to dehydration, resolved after gentle hydration. Ok to d/c IV fluids   AKI Likely due to dehydration Creatinine back to baseline after IV fluids, okay to DC NS, encourage oral hydration.  Monitor renal function in a.m.  Seizure d/o Continue Keppra, Seizure precautions   HTN  BP stable, holding antihypertensive medications monitor BP closely   DVT prophylaxis: Lovenox  Code Status: DNR  Family Communication: None at bedside  Disposition Plan: SNF when medically stable   Consultants:   PCCM   Procedures:   R side chest tube   Antimicrobials:  None     Objective: Vitals:   06/25/18 0012 06/25/18 0549 06/25/18 0559 06/25/18 1300  BP: 112/87  129/79 (!) 154/78  Pulse: 77  63 67  Resp: 16  15   Temp: 98.1 F (36.7 C)  97.6 F (36.4 C) (!) 97.4 F (36.3 C)  TempSrc: Oral  Oral Axillary  SpO2: 97%  96%   Weight:  49.8 kg      Intake/Output Summary (Last 24 hours) at 06/25/2018 1716 Last data filed at 06/25/2018 1530 Gross per 24 hour  Intake 3251.04 ml  Output 120 ml  Net 3131.04 ml   Filed Weights   06/24/18 0842 06/25/18 0549  Weight: 47.7 kg 49.8 kg    Examination:  General exam: NAD HEENT: OP moist and clear Respiratory system: Decreased BS on the right, chest tube in place.  Good aeration on the left Cardiovascular system: S1 & S2 heard, RRR. Gastrointestinal system: Abdomen is nondistended, soft and nontender.  Central nervous system: Drowsy but responsive to verbal stimuli, does not follow commands and is very confused.  Unknown baseline Extremities: No pedal edema.  Skin: No rashes Psychiatry: Unable to perform   Data Reviewed: I have personally reviewed following labs and imaging studies  CBC: Recent Labs  Lab 06/23/18 2353  WBC 5.7  HGB 12.8  HCT 41.8  MCV 97.4  PLT 178   Basic Metabolic Panel: Recent Labs  Lab 06/23/18 2353 06/25/18 0346  NA 146* 142  K 4.0 2.8*  CL 110 112*  CO2 27 25  GLUCOSE 130* 79  BUN 21 10  CREATININE 1.08* 0.62  CALCIUM 9.5 8.2*  MG  --  1.7   GFR: CrCl cannot be calculated (  Unknown ideal weight.). Liver Function Tests: Recent Labs  Lab 06/23/18 2353  AST 22  ALT 12  ALKPHOS 74  BILITOT 0.5  PROT 7.2  ALBUMIN 3.4*   No results for input(s): LIPASE, AMYLASE in the last 168 hours. Recent Labs  Lab 06/24/18 0130  AMMONIA 14   Coagulation Profile: No results for input(s): INR, PROTIME in the last 168 hours. Cardiac Enzymes: Recent Labs  Lab 06/24/18 0130  TROPONINI <0.03   BNP (last 3 results) No results for input(s): PROBNP in the last  8760 hours. HbA1C: No results for input(s): HGBA1C in the last 72 hours. CBG: Recent Labs  Lab 06/24/18 0034 06/24/18 1441  GLUCAP 107* 74   Lipid Profile: No results for input(s): CHOL, HDL, LDLCALC, TRIG, CHOLHDL, LDLDIRECT in the last 72 hours. Thyroid Function Tests: Recent Labs    06/24/18 1037  TSH 1.186   Anemia Panel: Recent Labs    06/24/18 1037  VITAMINB12 316   Sepsis Labs: Recent Labs  Lab 06/24/18 0027 06/24/18 0148  LATICACIDVEN 1.61 1.03    Recent Results (from the past 240 hour(s))  Blood culture (routine x 2)     Status: None (Preliminary result)   Collection Time: 06/24/18  1:30 AM  Result Value Ref Range Status   Specimen Description BLOOD RIGHT FOREARM  Final   Special Requests   Final    BOTTLES DRAWN AEROBIC AND ANAEROBIC Blood Culture results may not be optimal due to an inadequate volume of blood received in culture bottles   Culture   Final    NO GROWTH 1 DAY Performed at [facility], [address] [place], [place] [address]    Report Status PENDING  Incomplete  Blood culture (routine x 2)     Status: None (Preliminary result)   Collection Time: 06/24/18  1:45 AM  Result Value Ref Range Status   Specimen Description BLOOD LEFT ANTECUBITAL  Final   Special Requests   Final    BOTTLES DRAWN AEROBIC AND ANAEROBIC Blood Culture adequate volume   Culture   Final    NO GROWTH 1 DAY Performed at [facility], [address] [place], [place] [address]    Report Status PENDING  Incomplete  Urine Culture     Status: None   Collection Time: 06/24/18  4:52 AM  Result Value Ref Range Status   Specimen Description URINE, CATHETERIZED  Final   Special Requests NONE  Final   Culture   Final    NO GROWTH Performed at [facility], [address], [place], [place] [address]    Report Status 06/25/2018 FINAL  Final  MRSA PCR Screening     Status: None   Collection Time: 06/24/18  6:30 AM  Result Value Ref Range Status    MRSA by PCR NEGATIVE NEGATIVE Final    Comment:        The GeneXpert MRSA Assay (FDA approved for NASAL specimens only), is one component of a comprehensive MRSA colonization surveillance program. It is not intended to diagnose MRSA infection nor to guide or monitor treatment for MRSA infections. Performed at [facility], [address], [place], [place] [address]       Radiology Studies: Dg Chest 1 View  Result Date: 06/24/2018 CLINICAL DATA:  Altered mental status EXAM: CHEST  1 VIEW COMPARISON:  11/07/2017 chest radiograph. FINDINGS: Stable cardiomediastinal silhouette with normal heart size. Moderate to large right pneumothorax. No left pneumothorax. No pleural  effusion. Hazy parahilar and lower right lung opacity. Clear left lung. IMPRESSION: Moderate to large right pneumothorax, significantly increased in size since the most recent chest radiograph of 11/07/2017. Hazy parahilar and lower right lung opacity, favor atelectasis. No definite contralateral mediastinal shift to suggest tension. Critical Value/emergent results were called by telephone at the time of interpretation on 06/24/2018 at 1:30 am to Dr. Glynn Octave , who verbally acknowledged these results. Electronically Signed   By: Delbert Phenix M.D.   On: 06/24/2018 01:32   Ct Head Wo Contrast  Result Date: 06/24/2018 CLINICAL DATA:  84 year old female with altered mental status. EXAM: CT HEAD WITHOUT CONTRAST TECHNIQUE: Contiguous axial images were obtained from the base of the skull through the vertex without intravenous contrast. COMPARISON:  Head CT dated 11/02/2017 FINDINGS: Brain: There is age-related atrophy and chronic microvascular ischemic changes. There is no acute intracranial hemorrhage. No mass effect or midline shift. No extra-axial fluid collection. Vascular: No hyperdense vessel or unexpected calcification. Skull: Normal. Negative for fracture or focal lesion. Sinuses/Orbits: The visualized paranasal  sinuses are clear. There is chronic bilateral mastoid opacification and underdevelopment. Other: None IMPRESSION: 1. No acute intracranial pathology. 2. Age-related atrophy and chronic microvascular ischemic changes. Electronically Signed   By: Elgie Collard M.D.   On: 06/24/2018 01:39   Dg Chest Port 1 View  Result Date: 06/25/2018 CLINICAL DATA:  Shortness of Breath EXAM: PORTABLE CHEST 1 VIEW COMPARISON:  06/24/2018 FINDINGS: Cardiac shadow is stable. Aortic calcifications are again seen. Left lung remains clear. Pigtail catheter is noted on the right. Minimal pneumothorax remains although likely related to the tube positioning. No sizable effusion is noted. No bony abnormality is seen. IMPRESSION: Minimal stable right apical pneumothorax. Electronically Signed   By: Alcide Clever M.D.   On: 06/25/2018 10:53   Dg Chest Port 1 View  Result Date: 06/24/2018 CLINICAL DATA:  Follow-up examination status post chest tube adjustment. EXAM: PORTABLE CHEST 1 VIEW COMPARISON:  Prior radiograph from earlier the same day. FINDINGS: Stable cardiomegaly. Mediastinal silhouette unchanged. Aortic atherosclerosis. Right-sided chest tube again seen with tip now overlying the mid right upper lobe. Probable trace residual right apical pneumothorax, decreased from previous exam. Persistent bibasilar atelectatic changes. Right hemidiaphragm is elevated. No other new cardiopulmonary abnormality. Osseous structures unchanged. Soft tissue emphysema within the right chest wall related to chest tube placement. IMPRESSION: 1. Right-sided chest tube in place with tip now overlying the mid right upper lobe. Trace residual right apical pneumothorax, decreased from previous. 2. Persistent mild bibasilar atelectatic changes. No other new active cardiopulmonary disease. Electronically Signed   By: Rise Mu M.D.   On: 06/24/2018 04:47   Dg Chest Portable 1 View  Result Date: 06/24/2018 CLINICAL DATA:  Initial evaluation  for right-sided chest tube placement. EXAM: PORTABLE CHEST 1 VIEW COMPARISON:  Prior radiograph from earlier the same day. FINDINGS: Stable cardiomegaly. Mediastinal silhouette within normal limits. Aortic atherosclerosis. Interval placement of right-sided pigtail chest tube with tip overlying the medial right upper lobe. Interval rule read span shin of previously seen right-sided pneumothorax. Residual small pneumothorax seen at the right lung apex, pleural edge overlying the inferior margin of the right third rib. Hazy and linear right basilar opacity likely reflects atelectatic lung. Additional atelectatic changes at the left lung base. Mild perihilar vascular congestion without frank pulmonary edema. Osseous structures are unchanged. Soft tissue emphysema within the right chest wall related to chest tube placement. IMPRESSION: 1. Interval placement of pigtail right-sided chest tube with tip  overlying the medial right upper lobe. Previously seen right-sided pneumothorax has largely re-expanded, with small residual right apical pneumothorax as above. 2. Hazy bibasilar opacities, likely atelectasis. 3. Cardiomegaly with perihilar vascular congestion without frank pulmonary edema. Electronically Signed   By: Rise Mu M.D.   On: 06/24/2018 03:30      Scheduled Meds: . acetaminophen  500 mg Oral BID  . cholecalciferol  2,000 Units Oral Daily  . enoxaparin (LOVENOX) injection  30 mg Subcutaneous Q24H  . magnesium oxide  400 mg Oral Daily  . polyvinyl alcohol  1 drop Both Eyes BID  . potassium chloride SA  20 mEq Oral Daily  . senna-docusate  1 tablet Oral QHS   Continuous Infusions: . sodium chloride 75 mL/hr at 06/25/18 0600  . levETIRAcetam Stopped (06/25/18 0026)  . levETIRAcetam 500 mg (06/25/18 1303)     LOS: 1 day    Time spent: Total of 25 minutes spent with pt, greater than 50% of which was spent in discussion of  treatment, counseling and coordination of care  Latrelle Dodrill,  MD Pager: Text Page via [email or web address]   If 7PM-7AM, please contact night-coverage [email or web address] 06/25/2018, 5:16 PM   Note - This record has been created using [employer]. Chart creation errors have been sought, but may not always have been located. Such creation errors do not reflect on the standard of medical care.

## 2018-06-26 ENCOUNTER — Inpatient Hospital Stay (HOSPITAL_COMMUNITY): Payer: Medicare Other

## 2018-06-26 LAB — CBC WITH DIFFERENTIAL/PLATELET
ABS IMMATURE GRANULOCYTES: 0 10*3/uL (ref 0.0–0.1)
BASOS ABS: 0 10*3/uL (ref 0.0–0.1)
BASOS PCT: 0 %
Eosinophils Absolute: 0.3 10*3/uL (ref 0.0–0.7)
Eosinophils Relative: 5 %
HCT: 38.2 % (ref 36.0–46.0)
HEMOGLOBIN: 12.6 g/dL (ref 12.0–15.0)
IMMATURE GRANULOCYTES: 0 %
LYMPHS PCT: 24 %
Lymphs Abs: 1.2 10*3/uL (ref 0.7–4.0)
MCH: 30.3 pg (ref 26.0–34.0)
MCHC: 33 g/dL (ref 30.0–36.0)
MCV: 91.8 fL (ref 78.0–100.0)
MONOS PCT: 10 %
Monocytes Absolute: 0.5 10*3/uL (ref 0.1–1.0)
NEUTROS ABS: 3.1 10*3/uL (ref 1.7–7.7)
NEUTROS PCT: 61 %
PLATELETS: 247 10*3/uL (ref 150–400)
RBC: 4.16 MIL/uL (ref 3.87–5.11)
RDW: 13.6 % (ref 11.5–15.5)
WBC: 5.1 10*3/uL (ref 4.0–10.5)

## 2018-06-26 LAB — BASIC METABOLIC PANEL
ANION GAP: 8 (ref 5–15)
BUN: 8 mg/dL (ref 8–23)
CALCIUM: 8.7 mg/dL — AB (ref 8.9–10.3)
CO2: 25 mmol/L (ref 22–32)
CREATININE: 0.59 mg/dL (ref 0.44–1.00)
Chloride: 105 mmol/L (ref 98–111)
GLUCOSE: 81 mg/dL (ref 70–99)
Potassium: 3.1 mmol/L — ABNORMAL LOW (ref 3.5–5.1)
Sodium: 138 mmol/L (ref 135–145)

## 2018-06-26 LAB — MAGNESIUM: MAGNESIUM: 1.7 mg/dL (ref 1.7–2.4)

## 2018-06-26 MED ORDER — ADULT MULTIVITAMIN W/MINERALS CH: 1.0000 | ORAL_TABLET | Freq: Every day | ORAL | Status: DC

## 2018-06-26 MED ORDER — MAGNESIUM SULFATE 2 GM/50ML IV SOLN
2.0000 g | Freq: Once | INTRAVENOUS | Status: AC
  Administered 2018-06-26: 2 g via INTRAVENOUS
  Filled 2018-06-26: qty 50

## 2018-06-26 MED ORDER — POTASSIUM CHLORIDE 10 MEQ/100ML IV SOLN
10.0000 meq | INTRAVENOUS | Status: AC
  Administered 2018-06-26 (×3): 10 meq via INTRAVENOUS
  Filled 2018-06-26 (×3): qty 100

## 2018-06-26 MED ORDER — METOPROLOL TARTRATE 5 MG/5ML IV SOLN
5.0000 mg | Freq: Three times a day (TID) | INTRAVENOUS | Status: DC
  Administered 2018-06-26 – 2018-06-28 (×6): 5 mg via INTRAVENOUS
  Filled 2018-06-26 (×6): qty 5

## 2018-06-26 MED ORDER — ACETAMINOPHEN 650 MG RE SUPP: 650.0000 mg | RECTAL | Status: DC | PRN

## 2018-06-26 MED ORDER — BOOST / RESOURCE BREEZE PO LIQD CUSTOM
1.0000 | Freq: Three times a day (TID) | ORAL | Status: DC
  Administered 2018-06-27 – 2018-07-07 (×26): 1 via ORAL

## 2018-06-26 MED ORDER — MORPHINE SULFATE (PF) 2 MG/ML IV SOLN: 0.5000 mg | INTRAVENOUS | Status: DC | PRN

## 2018-06-26 NOTE — Progress Notes (Signed)
[facility] TEAM 1 - Stepdown/ICU TEAM  Lisaann Atha  ZOX:[id number] DOB: 11-17-1920 DOA: 06/23/2018 PCP: Mortimer Fries, PA    Brief Narrative:  84 year old female with a history of severe dementia, seizure disorder, hypertension, and recurrent spontaneous pneumothorax who was sent from her SNF to the ED due to altered mental status and lethargy.  In the ED she was found to have decreased breath sounds on the right, and a chest x-ray showed a large right-sided pneumothorax.  Head CT was negative for acute finding.  Patient was admitted with working diagnosis of recurrent spontaneous pneumothorax.    Subjective: No evidence of acute resp distress.  No evidence of uncontrolled pain.  Unable to provide a hx due to dementia.    Assessment & Plan:  Recurrent spontaneous pneumothorax Admit February 2019 w/ tension pneumothorax, now back with spontaneous non-tension pneumothorax - PCCM placed R sided pigtail chest tube 9/25 - management per PCCM   Acute metabolic encephalopathy in setting of severe dementia Baseline not clear - mental status appears stable presently   Hypernatremia due to dehydration - resolved after hydration  Hypokalemia Supplement and follow  Modest hypomagnesemia  Supplement and follow in setting of hypokalemia   AKI due to dehydration - creatinine back to baseline after IV fluids  Seizure d/o Continue Keppra  HTN  BP poorly controlled as pt not alert enough to take oral meds - converted to IV meds - follow   DVT prophylaxis: lovenox  Code Status: DNR - NO CODE Family Communication: no family present at time of exam  Disposition Plan: SDU  Consultants:  PCCM  Antimicrobials:  none  Objective: Blood pressure (!) 188/113, pulse 78, temperature 98.8 F (37.1 C), temperature source Axillary, resp. rate 16, weight 47.9 kg, SpO2 99 %.  Intake/Output Summary (Last 24 hours) at 06/26/2018 1703 Last data filed at 06/26/2018 1056 Gross per 24 hour  Intake  601.44 ml  Output 90 ml  Net 511.44 ml   Filed Weights   06/24/18 0842 06/25/18 0549 06/26/18 0605  Weight: 47.7 kg 49.8 kg 47.9 kg    Examination: General: No acute respiratory distress Lungs: Clear to auscultation bilaterally without wheezes or crackles Cardiovascular: Regular rate and rhythm without murmur  Abdomen: Nontender, nondistended, soft, bowel sounds positive, no rebound Extremities: No significant cyanosis, clubbing, or edema bilateral lower extremities  CBC: Recent Labs  Lab 06/23/18 2353 06/26/18 0436  WBC 5.7 5.1  NEUTROABS  --  3.1  HGB 12.8 12.6  HCT 41.8 38.2  MCV 97.4 91.8  PLT 178 247   Basic Metabolic Panel: Recent Labs  Lab 06/23/18 2353 06/25/18 0346 06/26/18 0436  NA 146* 142 138  K 4.0 2.8* 3.1*  CL 110 112* 105  CO2 27 25 25   GLUCOSE 130* 79 81  BUN 21 10 8   CREATININE 1.08* 0.62 0.59  CALCIUM 9.5 8.2* 8.7*  MG  --  1.7 1.7   GFR: CrCl cannot be calculated (Unknown ideal weight.).  Liver Function Tests: Recent Labs  Lab 06/23/18 2353  AST 22  ALT 12  ALKPHOS 74  BILITOT 0.5  PROT 7.2  ALBUMIN 3.4*   Recent Labs  Lab 06/24/18 0130  AMMONIA 14   Cardiac Enzymes: Recent Labs  Lab 06/24/18 0130  TROPONINI <0.03   CBG: Recent Labs  Lab 06/24/18 0034 06/24/18 1441  GLUCAP 107* 74    Recent Results (from the past 240 hour(s))  Blood culture (routine x 2)     Status: None (Preliminary result)  Collection Time: 06/24/18  1:30 AM  Result Value Ref Range Status   Specimen Description BLOOD RIGHT FOREARM  Final   Special Requests   Final    BOTTLES DRAWN AEROBIC AND ANAEROBIC Blood Culture results may not be optimal due to an inadequate volume of blood received in culture bottles   Culture   Final    NO GROWTH 2 DAYS Performed at [facility], [address] [place], [place] [address]    Report Status PENDING  Incomplete  Blood culture (routine x 2)     Status: None (Preliminary result)   Collection Time:  06/24/18  1:45 AM  Result Value Ref Range Status   Specimen Description BLOOD LEFT ANTECUBITAL  Final   Special Requests   Final    BOTTLES DRAWN AEROBIC AND ANAEROBIC Blood Culture adequate volume   Culture   Final    NO GROWTH 2 DAYS Performed at [facility], [address], [place], [place] [address]    Report Status PENDING  Incomplete  Urine Culture     Status: None   Collection Time: 06/24/18  4:52 AM  Result Value Ref Range Status   Specimen Description URINE, CATHETERIZED  Final   Special Requests NONE  Final   Culture   Final    NO GROWTH Performed at [facility], [address], [place], [place] [address]    Report Status 06/25/2018 FINAL  Final  MRSA PCR Screening     Status: None   Collection Time: 06/24/18  6:30 AM  Result Value Ref Range Status   MRSA by PCR NEGATIVE NEGATIVE Final    Comment:        The GeneXpert MRSA Assay (FDA approved for NASAL specimens only), is one component of a comprehensive MRSA colonization surveillance program. It is not intended to diagnose MRSA infection nor to guide or monitor treatment for MRSA infections. Performed at [facility], [address] [place], [place] [address]      Scheduled Meds: . acetaminophen  500 mg Oral BID  . cholecalciferol  2,000 Units Oral Daily  . enoxaparin (LOVENOX) injection  30 mg Subcutaneous Q24H  . feeding supplement  1 Container Oral TID BM  . magnesium oxide  400 mg Oral Daily  . multivitamin with minerals  1 tablet Oral Daily  . polyvinyl alcohol  1 drop Both Eyes BID  . potassium chloride SA  20 mEq Oral Daily  . senna-docusate  1 tablet Oral QHS     LOS: 2 days   Lonia Blood, MD [facility] Office  [phone] Pager - Text Page per Amion  If 7PM-7AM, please contact night-coverage per Amion 06/26/2018, 5:03 PM

## 2018-06-26 NOTE — Progress Notes (Signed)
Initial Nutrition Assessment  DOCUMENTATION CODES:   (Will assess for malnutrition at follow-up. )  INTERVENTION:  - Will order Boost Breeze TID, each supplement provides 250 kcal and 9 grams of protein. - Will order daily multivitamin with minerals. - Diet advancement as medically feasible.  - Will perform NFPE at follow-up, if feasible.   NUTRITION DIAGNOSIS:   Inadequate oral intake related to acute illness, lethargy/confusion as evidenced by meal completion < 25%.  GOAL:   Patient will meet greater than or equal to 90% of their needs  MONITOR:   PO intake, Supplement acceptance, Weight trends, Labs  REASON FOR ASSESSMENT:   Malnutrition Screening Tool  ASSESSMENT:   84 year old female with medical history of severe dementia, seizure disorder, HTN, history of recurrent spontaneous pneumothorax was sent from SNF to the ED due to AMS and lethargy.  Upon ED evaluation patient was found to have decreased breath sounds on the right side, hemodynamically stable and chest x-ray showed large right-sided pneumothorax.  Head CT was negative for acute finding.  Patient was admitted with working diagnosis of recurrent spontaneous pneumothorax.  BMI indicates normal weight. Per chart review, patient consumed 25% of breakfast and 10% of lunch yesterday. Breakfast tray on bedside table, untouched. Patient sleeping. Flowsheet indicates patient is disoriented. No family/visitors present at this time to provide PTA information. Per chart review, weight has been stable since February 2019.   Medications reviewed; 2000 units vitamin D/day, 400 mg Mag-ox/day, 10 mEq IV KCl x4 runs yesterday, 20 mEq oral KCl/day, 1 tablet Senokot/day.  Labs reviewed; K: 3.1 mmol/L, Ca: 8.7 mg/dL.       NUTRITION - FOCUSED PHYSICAL EXAM:  Unable to perform at this time as patient sleeping, disoriented, no family present.   Diet Order:   Diet Order            Diet clear liquid Room service appropriate?  Yes; Fluid consistency: Thin  Diet effective now              EDUCATION NEEDS:   Not appropriate for education at this time  Skin:  Skin Assessment: Reviewed RN Assessment  Last BM:  9/26  Height:   Ht Readings from Last 1 Encounters:  11/18/17 5' (1.524 m)    Weight:   Wt Readings from Last 1 Encounters:  06/26/18 47.9 kg    Ideal Body Weight:     BMI:  Body mass index is 20.62 kg/m.  Estimated Nutritional Needs:   Kcal:  4098-1191  Protein:  50-60 grams  Fluid:  >/= 1.5 L/day     Trenton Gammon, MS, RD, LDN, [facility] Inpatient Clinical Dietitian Pager # [phone] After hours/weekend pager # [phone]

## 2018-06-26 NOTE — Progress Notes (Signed)
NAME:  Isabella [name], MRN:  [id number], DOB:  12/19/1920, LOS: 2 ADMISSION DATE:  06/23/2018, CONSULTATION DATE:  06/24/2018 REFERRING MD:  Manus Gunning MD, CHIEF COMPLAINT:  Altered mental status   Brief History   84 yr old patient with PMHx of spontaneous Tension PTX in February presents to [facility] on 9/25 with Altered mental status, unresponsive at facility, on evaluation found to have a Right sided PTX with no signs of tension. Hemodynamically stable. PCCM consulted for mgmt. CT placed by EDP. Follow up CXR with some mild residual apical pneumothorax.   Significant Hospital Events   Right sided Pneumothorax on presentation  Consults: date of consult/date signed off & final recs:  PCCM  Procedures (surgical and bedside):  Chest Tube (pigtail)- 06/24/18 9/26 > PTX resolved on 20 cmH20 suction, transitioned to water seal 9/27 > Recurrent R PTX on water seal. Placed back to 20 cmH20  Significant Diagnostic Tests:    Subjective:  unable  Objective   Blood pressure (!) 182/93, pulse 79, temperature 99.2 F (37.3 C), resp. rate 16, weight 47.9 kg, SpO2 95 %.        Intake/Output Summary (Last 24 hours) at 06/26/2018 1359 Last data filed at 06/26/2018 1056 Gross per 24 hour  Intake 1082.22 ml  Output 90 ml  Net 992.22 ml   Filed Weights   06/24/18 0842 06/25/18 0549 06/26/18 0605  Weight: 47.7 kg 49.8 kg 47.9 kg    Examination: General:  Frail elderly female in NAD Neuro:  Spontaneously awake, alert, agitated. Trying to climb out of bed. Confused.  HEENT:  [place]/AT, No JVD noted, PERRL Cardiovascular:  RRR, no MRG Lungs:  Clear bilateral Abdomen:  Soft, non-distended Musculoskeletal:  No acute deformity Skin:  Intact, MMM   Resolved Hospital Problem list     Assessment & Plan:  84 yr old female with PMHx significant for Vascular Dementia, HTN, Failure to thrive, severe malnutrition, hypernatremia from dehydration, seizures, and CKD stage III  Right sided pneumothorax without  tension s/p chest tube placement - pigtail catheter placed 9/25 early AM. Changed to waterseal after improvement 9/26, then 9/27 recurrence of PTX, so placed back to 20cm H20 - Return to suction 20cm H20. CXR in AM - Continue on supplemental O2 via [place] to keep O2 sats >92% - Monitor CT output - Poor candidate for pleurodesis, but may need to consider it this will not resolve.   Advanced dementia  - per primary.  - DNR    Disposition / Summary of Today's Plan 06/26/18   Suction returned to 20cmH20. CXR in AM.     Diet: Crushed with puree( Aspiration risk)- pt has a h/o dysphagia DVT prophylaxis: enoxaparin  GI prophylaxis: not indicated at this time Hyperglycemia protocol: no h/o insulin dependence. Mobility: bedrest Code Status: DNR Family Communication: Daughter is NOK. Wilma Michaelson updated by EDP  Labs   CBC: Recent Labs  Lab 06/23/18 2353 06/26/18 0436  WBC 5.7 5.1  NEUTROABS  --  3.1  HGB 12.8 12.6  HCT 41.8 38.2  MCV 97.4 91.8  PLT 178 247    Basic Metabolic Panel: Recent Labs  Lab 06/23/18 2353 06/25/18 0346 06/26/18 0436  NA 146* 142 138  K 4.0 2.8* 3.1*  CL 110 112* 105  CO2 27 25 25   GLUCOSE 130* 79 81  BUN 21 10 8   CREATININE 1.08* 0.62 0.59  CALCIUM 9.5 8.2* 8.7*  MG  --  1.7 1.7   GFR: CrCl cannot be calculated (Unknown  ideal weight.). Recent Labs  Lab 06/23/18 2353 06/24/18 0027 06/24/18 0148 06/26/18 0436  WBC 5.7  --   --  5.1  LATICACIDVEN  --  1.61 1.03  --     Liver Function Tests: Recent Labs  Lab 06/23/18 2353  AST 22  ALT 12  ALKPHOS 74  BILITOT 0.5  PROT 7.2  ALBUMIN 3.4*   No results for input(s): LIPASE, AMYLASE in the last 168 hours. Recent Labs  Lab 06/24/18 0130  AMMONIA 14    ABG    Component Value Date/Time   TCO2 24 03/15/2016 1118     Coagulation Profile: No results for input(s): INR, PROTIME in the last 168 hours.  Cardiac Enzymes: Recent Labs  Lab 06/24/18 0130  TROPONINI <0.03     HbA1C: No results found for: HGBA1C  CBG: Recent Labs  Lab 06/24/18 0034 06/24/18 1441  GLUCAP 107* 74    Admitting History of Present Illness.   84 yr old female with PMHx significant for vascular Dementia, HTN, Failure to thrive, severe malnutrition, hypernatremia from dehydration, seizures, and CKD stage III presents from [facility] with increasing altered mental status, they stated that the pt had slurred speech and seemed different from baseline and was found unresponsive in bed at facility. On arrival to [facility] she was alert but not oriented.  Received workup including a CXR which showed large right sided pneumothorax. PCCM consulted to assist in management.   Joneen Roach, AGACNP-BC [facility] Pulmonology/Critical Care Pager [phone] or [phone]  06/26/2018 1:59 PM

## 2018-06-27 ENCOUNTER — Inpatient Hospital Stay (HOSPITAL_COMMUNITY): Payer: Medicare Other

## 2018-06-27 LAB — CBC
HCT: 41.3 % (ref 36.0–46.0)
Hemoglobin: 13.3 g/dL (ref 12.0–15.0)
MCH: 30.2 pg (ref 26.0–34.0)
MCHC: 32.2 g/dL (ref 30.0–36.0)
MCV: 93.7 fL (ref 78.0–100.0)
PLATELETS: 247 10*3/uL (ref 150–400)
RBC: 4.41 MIL/uL (ref 3.87–5.11)
RDW: 13.5 % (ref 11.5–15.5)
WBC: 5.7 10*3/uL (ref 4.0–10.5)

## 2018-06-27 LAB — BASIC METABOLIC PANEL
ANION GAP: 12 (ref 5–15)
BUN: 15 mg/dL (ref 8–23)
CALCIUM: 8.6 mg/dL — AB (ref 8.9–10.3)
CHLORIDE: 105 mmol/L (ref 98–111)
CO2: 19 mmol/L — AB (ref 22–32)
Creatinine, Ser: 0.77 mg/dL (ref 0.44–1.00)
GFR calc Af Amer: >60 mL/min (ref >60)
GFR calc non Af Amer: >60 mL/min (ref >60)
Glucose, Bld: 70 mg/dL (ref 70–99)
Potassium: 3.4 mmol/L — ABNORMAL LOW (ref 3.5–5.1)
SODIUM: 136 mmol/L (ref 135–145)

## 2018-06-27 LAB — MAGNESIUM: MAGNESIUM: 2.2 mg/dL (ref 1.7–2.4)

## 2018-06-27 NOTE — Progress Notes (Signed)
NAME:  Isabella [name], MRN:  [id number], DOB:  04-23-1921, LOS: 3 ADMISSION DATE:  06/23/2018, CONSULTATION DATE:  06/24/2018 REFERRING MD:  Manus Gunning MD, CHIEF COMPLAINT:  Altered mental status   Brief History   84 yr old patient with PMHx of spontaneous Tension PTX in February presents to [facility] on 9/25 with Altered mental status, unresponsive at facility, on evaluation found to have a Right sided PTX with no signs of tension. Hemodynamically stable. PCCM consulted for mgmt. CT placed by EDP. Follow up CXR with some mild residual apical pneumothorax.   Significant Hospital Events   Right sided Pneumothorax on presentation  Consults: date of consult/date signed off & final recs:  PCCM  Procedures (surgical and bedside):  Chest Tube (pigtail)- 06/24/18 9/26 > PTX resolved on 20 cmH20 suction, transitioned to water seal 9/27 > Recurrent R PTX on water seal. Placed back to 20 cmH20  Significant Diagnostic Tests:    Subjective:  Unable. NAD.   Objective   Blood pressure (!) 147/90, pulse 69, temperature 97.6 F (36.4 C), temperature source Oral, resp. rate 14, weight 44.8 kg, SpO2 95 %.        Intake/Output Summary (Last 24 hours) at 06/27/2018 1128 Last data filed at 06/27/2018 1055 Gross per 24 hour  Intake 450 ml  Output 60 ml  Net 390 ml   Filed Weights   06/25/18 0549 06/26/18 0605 06/27/18 0610  Weight: 49.8 kg 47.9 kg 44.8 kg    Examination: General:  Frail elderly female in NAD Neuro: opens eyes, does not seem to track, does not follow commands, calm.  HEENT:  [place]/AT, No JVD noted, PERRL Cardiovascular:  RRR, no MRG Lungs:  Clear bilateral, R chest tube to suction, no air leak Abdomen:  Soft, non-distended Musculoskeletal:  No acute deformity Skin:  Intact, MMM   Resolved Hospital Problem list     Assessment & Plan:  84 yr old female with PMHx significant for Vascular Dementia, HTN, Failure to thrive, severe malnutrition, hypernatremia from dehydration, seizures,  and CKD stage III  Right sided pneumothorax without tension s/p chest tube placement - pigtail catheter placed 9/25 early AM. Changed to waterseal after improvement 9/26, then 9/27 recurrence of PTX, so placed back to 20cm H20.  9/28 CXR with resolution of apical ptx.  PLAN -  Given recurrence of ptx in this frail, elderly pt would recommend additional 24hrs of suction  F/u CXR in am and consider re-try water seal if ptx still resolved  Poor candidate for pleurodesis   Advanced dementia  - per primary. - DNR   Disposition / Summary of Today's Plan 06/27/18   Continue suction x24 hrs  Repeat CXR in am     Diet: Crushed with puree( Aspiration risk)- pt has a h/o dysphagia DVT prophylaxis: enoxaparin  GI prophylaxis: not indicated at this time Hyperglycemia protocol: no h/o insulin dependence. Mobility: bedrest Code Status: DNR Family Communication: no family at bedside on NP rounds 9/28  Labs   CBC: Recent Labs  Lab 06/23/18 2353 06/26/18 0436 06/27/18 0621  WBC 5.7 5.1 5.7  NEUTROABS  --  3.1  --   HGB 12.8 12.6 13.3  HCT 41.8 38.2 41.3  MCV 97.4 91.8 93.7  PLT 178 247 247    Basic Metabolic Panel: Recent Labs  Lab 06/23/18 2353 06/25/18 0346 06/26/18 0436 06/27/18 0621  NA 146* 142 138 136  K 4.0 2.8* 3.1* 3.4*  CL 110 112* 105 105  CO2 27 25 25  19*  GLUCOSE 130* 79 81 70  BUN 21 10 8 15   CREATININE 1.08* 0.62 0.59 0.77  CALCIUM 9.5 8.2* 8.7* 8.6*  MG  --  1.7 1.7 2.2   GFR: CrCl cannot be calculated (Unknown ideal weight.). Recent Labs  Lab 06/23/18 2353 06/24/18 0027 06/24/18 0148 06/26/18 0436 06/27/18 0621  WBC 5.7  --   --  5.1 5.7  LATICACIDVEN  --  1.61 1.03  --   --     Liver Function Tests: Recent Labs  Lab 06/23/18 2353  AST 22  ALT 12  ALKPHOS 74  BILITOT 0.5  PROT 7.2  ALBUMIN 3.4*   No results for input(s): LIPASE, AMYLASE in the last 168 hours. Recent Labs  Lab 06/24/18 0130  AMMONIA 14    ABG    Component  Value Date/Time   TCO2 24 03/15/2016 1118     Coagulation Profile: No results for input(s): INR, PROTIME in the last 168 hours.  Cardiac Enzymes: Recent Labs  Lab 06/24/18 0130  TROPONINI <0.03    HbA1C: No results found for: HGBA1C  CBG: Recent Labs  Lab 06/24/18 0034 06/24/18 1441  GLUCAP 107* 74    Admitting History of Present Illness.   84 yr old female with PMHx significant for vascular Dementia, HTN, Failure to thrive, severe malnutrition, hypernatremia from dehydration, seizures, and CKD stage III presents from [facility] with increasing altered mental status, they stated that the pt had slurred speech and seemed different from baseline and was found unresponsive in bed at facility. On arrival to [facility] she was alert but not oriented.  Received workup including a CXR which showed large right sided pneumothorax. PCCM consulted to assist in management.   Dirk Dress, NP 06/27/2018  11:28 AM Pager: [phone] or [phone]

## 2018-06-27 NOTE — Progress Notes (Signed)
[facility] TEAM 1 - Stepdown/ICU TEAM  Isabella [name]  ZOX:[id number] DOB: 06-20-1921 DOA: 06/23/2018 PCP: Mortimer Fries, PA    Brief Narrative:  84 year old female with a history of severe dementia, seizure disorder, hypertension, and recurrent spontaneous pneumothorax who was sent from her SNF to the ED due to altered mental status and lethargy.  In the ED she was found to have decreased breath sounds on the right, and a chest x-ray showed a large right-sided pneumothorax.  Head CT was negative for acute finding.  Patient was admitted with working diagnosis of recurrent spontaneous pneumothorax.    Subjective: More alert and interactive today, though is severely demented and unable to communicate. No evidence of resp distress.    Assessment & Plan:  Recurrent spontaneous pneumothorax Admit February 2019 w/ tension pneumothorax, now back with spontaneous non-tension pneumothorax - PCCM placed R sided pigtail chest tube 9/25 - management per PCCM   Acute metabolic encephalopathy in setting of severe dementia dgtr at bedside today confirms pt is very close to her baseline mental status   Hypernatremia due to dehydration - resolved after hydration  Hypokalemia Cont to supplement and follow  Modest hypomagnesemia  Corrected   AKI due to dehydration - creatinine back to baseline after IV fluids  Seizure d/o Continue Keppra  HTN  BP poorly controlled as pt not alert enough to take oral meds - converted to IV meds - no change in tx today   DVT prophylaxis: lovenox  Code Status: DNR - NO CODE Family Communication: spoke w/ daughter at bedside   Disposition Plan: SDU  Consultants:  PCCM  Antimicrobials:  none  Objective: Blood pressure (!) 147/87, pulse 61, temperature 98.1 F (36.7 C), resp. rate 16, weight 44.8 kg, SpO2 100 %.  Intake/Output Summary (Last 24 hours) at 06/27/2018 1820 Last data filed at 06/27/2018 1237 Gross per 24 hour  Intake 810 ml  Output 60 ml  Net  750 ml   Filed Weights   06/25/18 0549 06/26/18 0605 06/27/18 0610  Weight: 49.8 kg 47.9 kg 44.8 kg    Examination: General: No acute respiratory distress in bed Lungs: CTA B  Cardiovascular: RRR Abdomen: Nontender, nondistended, soft, bowel sounds positive Extremities: no signif C/C/E B LE   CBC: Recent Labs  Lab 06/23/18 2353 06/26/18 0436 06/27/18 0621  WBC 5.7 5.1 5.7  NEUTROABS  --  3.1  --   HGB 12.8 12.6 13.3  HCT 41.8 38.2 41.3  MCV 97.4 91.8 93.7  PLT 178 247 247   Basic Metabolic Panel: Recent Labs  Lab 06/25/18 0346 06/26/18 0436 06/27/18 0621  NA 142 138 136  K 2.8* 3.1* 3.4*  CL 112* 105 105  CO2 25 25 19*  GLUCOSE 79 81 70  BUN 10 8 15   CREATININE 0.62 0.59 0.77  CALCIUM 8.2* 8.7* 8.6*  MG 1.7 1.7 2.2   GFR: CrCl cannot be calculated (Unknown ideal weight.).  Liver Function Tests: Recent Labs  Lab 06/23/18 2353  AST 22  ALT 12  ALKPHOS 74  BILITOT 0.5  PROT 7.2  ALBUMIN 3.4*   Recent Labs  Lab 06/24/18 0130  AMMONIA 14   Cardiac Enzymes: Recent Labs  Lab 06/24/18 0130  TROPONINI <0.03   CBG: Recent Labs  Lab 06/24/18 0034 06/24/18 1441  GLUCAP 107* 74    Recent Results (from the past 240 hour(s))  Blood culture (routine x 2)     Status: None (Preliminary result)   Collection Time: 06/24/18  1:30 AM  Result Value Ref Range Status   Specimen Description BLOOD RIGHT FOREARM  Final   Special Requests   Final    BOTTLES DRAWN AEROBIC AND ANAEROBIC Blood Culture results may not be optimal due to an inadequate volume of blood received in culture bottles   Culture   Final    NO GROWTH 3 DAYS Performed at [facility], [address], [place], [place] [address]    Report Status PENDING  Incomplete  Blood culture (routine x 2)     Status: None (Preliminary result)   Collection Time: 06/24/18  1:45 AM  Result Value Ref Range Status   Specimen Description BLOOD LEFT ANTECUBITAL  Final   Special Requests   Final     BOTTLES DRAWN AEROBIC AND ANAEROBIC Blood Culture adequate volume   Culture   Final    NO GROWTH 3 DAYS Performed at [facility], [address], [place], [place] [address]    Report Status PENDING  Incomplete  Urine Culture     Status: None   Collection Time: 06/24/18  4:52 AM  Result Value Ref Range Status   Specimen Description URINE, CATHETERIZED  Final   Special Requests NONE  Final   Culture   Final    NO GROWTH Performed at [facility], [address] [place], [place] [address]    Report Status 06/25/2018 FINAL  Final  MRSA PCR Screening     Status: None   Collection Time: 06/24/18  6:30 AM  Result Value Ref Range Status   MRSA by PCR NEGATIVE NEGATIVE Final    Comment:        The GeneXpert MRSA Assay (FDA approved for NASAL specimens only), is one component of a comprehensive MRSA colonization surveillance program. It is not intended to diagnose MRSA infection nor to guide or monitor treatment for MRSA infections. Performed at [facility], [address], [place], [place] [address]      Scheduled Meds: . enoxaparin (LOVENOX) injection  30 mg Subcutaneous Q24H  . feeding supplement  1 Container Oral TID BM  . metoprolol tartrate  5 mg Intravenous Q8H  . polyvinyl alcohol  1 drop Both Eyes BID     LOS: 3 days   Lonia Blood, MD [facility] Office  [phone] Pager - Text Page per Loretha Stapler  If 7PM-7AM, please contact night-coverage per Amion 06/27/2018, 6:20 PM

## 2018-06-28 ENCOUNTER — Inpatient Hospital Stay (HOSPITAL_COMMUNITY): Payer: Medicare Other

## 2018-06-28 LAB — BASIC METABOLIC PANEL
Anion gap: 11 (ref 5–15)
BUN: 12 mg/dL (ref 8–23)
CALCIUM: 8.9 mg/dL (ref 8.9–10.3)
CO2: 24 mmol/L (ref 22–32)
Chloride: 103 mmol/L (ref 98–111)
Creatinine, Ser: 0.64 mg/dL (ref 0.44–1.00)
Glucose, Bld: 84 mg/dL (ref 70–99)
Potassium: 3.2 mmol/L — ABNORMAL LOW (ref 3.5–5.1)
SODIUM: 138 mmol/L (ref 135–145)

## 2018-06-28 MED ORDER — ACETAMINOPHEN 325 MG PO TABS: 650.0000 mg | ORAL_TABLET | Freq: Four times a day (QID) | ORAL | Status: DC | PRN

## 2018-06-28 MED ORDER — ACETAMINOPHEN 325 MG PO TABS
650.0000 mg | ORAL_TABLET | ORAL | Status: DC | PRN
  Administered 2018-07-04: 650 mg via ORAL
  Filled 2018-06-28: qty 2

## 2018-06-28 MED ORDER — METOPROLOL TARTRATE 12.5 MG HALF TABLET
12.5000 mg | ORAL_TABLET | Freq: Two times a day (BID) | ORAL | Status: DC
  Administered 2018-06-28 – 2018-07-07 (×18): 12.5 mg via ORAL
  Filled 2018-06-28 (×18): qty 1

## 2018-06-28 MED ORDER — POTASSIUM CHLORIDE CRYS ER 20 MEQ PO TBCR
40.0000 meq | EXTENDED_RELEASE_TABLET | Freq: Two times a day (BID) | ORAL | Status: DC
  Administered 2018-06-28 – 2018-06-29 (×2): 40 meq via ORAL
  Filled 2018-06-28 (×3): qty 2

## 2018-06-28 NOTE — Progress Notes (Signed)
TEAM 1 - Stepdown/ICU TEAM  Cing [place]  ZOX:[id number] DOB: 28-Jul-1921 DOA: 06/23/2018 PCP: Mortimer Fries, PA    Brief Narrative:  84 year old female with a history of severe dementia, seizure disorder, hypertension, and recurrent spontaneous pneumothorax who was sent from her SNF to the ED due to altered mental status and lethargy.  In the ED she was found to have decreased breath sounds on the right, and a chest x-ray showed a large right-sided pneumothorax.  Head CT was negative for acute finding.  Patient was admitted with working diagnosis of recurrent spontaneous pneumothorax.    Subjective: Fully alert and interactive today, but signif demented and unable to carry on an actual conversation. Did not appear uncomfortable. Was in no appreciable resp distress.   Assessment & Plan:  Recurrent spontaneous pneumothorax Admit February 2019 w/ tension pneumothorax, now back with spontaneous non-tension pneumothorax - PCCM placed R sided pigtail chest tube 9/25 - management per PCCM - appears to be stable on suction at this time   Acute metabolic encephalopathy in setting of severe dementia Pt has returned to her baseline   Hypernatremia due to dehydration - resolved after hydration  Hypokalemia Cont to supplement and follow j- goal is 4.0  Modest hypomagnesemia  Corrected   AKI due to dehydration - creatinine back to baseline after IV fluids  Seizure d/o Continue Keppra  HTN  BP variable - attempt to resume oral meds   DVT prophylaxis: lovenox  Code Status: DNR - NO CODE Family Communication: no family present at time of exam today  Disposition Plan: SDU  Consultants:  PCCM  Antimicrobials:  none  Objective: Blood pressure 117/77, pulse 64, temperature (!) 97.3 F (36.3 C), temperature source Oral, resp. rate 17, weight 44.6 kg, SpO2 98 %.  Intake/Output Summary (Last 24 hours) at 06/28/2018 1617 Last data filed at 06/28/2018 0900 Gross per 24 hour    Intake 220 ml  Output 70 ml  Net 150 ml   Filed Weights   06/26/18 0605 06/27/18 0610 06/28/18 0619  Weight: 47.9 kg 44.8 kg 44.6 kg    Examination: General: No acute respiratory distress in bed - more alert today  Lungs: CTA B w/ air movement in all fields  Cardiovascular: RRR Abdomen: NT/ND, soft, bs+ Extremities: no signif E B LE   CBC: Recent Labs  Lab 06/23/18 2353 06/26/18 0436 06/27/18 0621  WBC 5.7 5.1 5.7  NEUTROABS  --  3.1  --   HGB 12.8 12.6 13.3  HCT 41.8 38.2 41.3  MCV 97.4 91.8 93.7  PLT 178 247 247   Basic Metabolic Panel: Recent Labs  Lab 06/25/18 0346 06/26/18 0436 06/27/18 0621 06/28/18 0644  NA 142 138 136 138  K 2.8* 3.1* 3.4* 3.2*  CL 112* 105 105 103  CO2 25 25 19* 24  GLUCOSE 79 81 70 84  BUN 10 8 15 12   CREATININE 0.62 0.59 0.77 0.64  CALCIUM 8.2* 8.7* 8.6* 8.9  MG 1.7 1.7 2.2  --    GFR: CrCl cannot be calculated (Unknown ideal weight.).  Liver Function Tests: Recent Labs  Lab 06/23/18 2353  AST 22  ALT 12  ALKPHOS 74  BILITOT 0.5  PROT 7.2  ALBUMIN 3.4*   Recent Labs  Lab 06/24/18 0130  AMMONIA 14   Cardiac Enzymes: Recent Labs  Lab 06/24/18 0130  TROPONINI <0.03   CBG: Recent Labs  Lab 06/24/18 0034 06/24/18 1441  GLUCAP 107* 74    Recent Results (from the  past 240 hour(s))  Blood culture (routine x 2)     Status: None (Preliminary result)   Collection Time: 06/24/18  1:30 AM  Result Value Ref Range Status   Specimen Description BLOOD RIGHT FOREARM  Final   Special Requests   Final    BOTTLES DRAWN AEROBIC AND ANAEROBIC Blood Culture results may not be optimal due to an inadequate volume of blood received in culture bottles   Culture   Final    NO GROWTH 4 DAYS Performed at [facility], [address], [place], [place] [address]    Report Status PENDING  Incomplete  Blood culture (routine x 2)     Status: None (Preliminary result)   Collection Time: 06/24/18  1:45 AM  Result Value Ref  Range Status   Specimen Description BLOOD LEFT ANTECUBITAL  Final   Special Requests   Final    BOTTLES DRAWN AEROBIC AND ANAEROBIC Blood Culture adequate volume   Culture   Final    NO GROWTH 4 DAYS Performed at [facility], [address] [place], [place] [address]    Report Status PENDING  Incomplete  Urine Culture     Status: None   Collection Time: 06/24/18  4:52 AM  Result Value Ref Range Status   Specimen Description URINE, CATHETERIZED  Final   Special Requests NONE  Final   Culture   Final    NO GROWTH Performed at [facility], [address] [place], [place] [address]    Report Status 06/25/2018 FINAL  Final  MRSA PCR Screening     Status: None   Collection Time: 06/24/18  6:30 AM  Result Value Ref Range Status   MRSA by PCR NEGATIVE NEGATIVE Final    Comment:        The GeneXpert MRSA Assay (FDA approved for NASAL specimens only), is one component of a comprehensive MRSA colonization surveillance program. It is not intended to diagnose MRSA infection nor to guide or monitor treatment for MRSA infections. Performed at [facility], [address], [place], [place] [address]      Scheduled Meds: . enoxaparin (LOVENOX) injection  30 mg Subcutaneous Q24H  . feeding supplement  1 Container Oral TID BM  . metoprolol tartrate  5 mg Intravenous Q8H  . polyvinyl alcohol  1 drop Both Eyes BID     LOS: 4 days   Lonia Blood, MD [facility] Office  [phone] Pager - Text Page per Loretha Stapler  If 7PM-7AM, please contact night-coverage per Amion 06/28/2018, 4:17 PM

## 2018-06-29 LAB — CULTURE, BLOOD (ROUTINE X 2)
CULTURE: NO GROWTH
Culture: NO GROWTH
Special Requests: ADEQUATE

## 2018-06-29 LAB — BASIC METABOLIC PANEL
ANION GAP: 7 (ref 5–15)
BUN: 11 mg/dL (ref 8–23)
CALCIUM: 8.7 mg/dL — AB (ref 8.9–10.3)
CO2: 26 mmol/L (ref 22–32)
CREATININE: 0.61 mg/dL (ref 0.44–1.00)
Chloride: 107 mmol/L (ref 98–111)
GFR calc Af Amer: >60 mL/min (ref >60)
GLUCOSE: 97 mg/dL (ref 70–99)
Potassium: 3.1 mmol/L — ABNORMAL LOW (ref 3.5–5.1)
Sodium: 140 mmol/L (ref 135–145)

## 2018-06-29 LAB — MAGNESIUM: Magnesium: 1.7 mg/dL (ref 1.7–2.4)

## 2018-06-29 MED ORDER — POTASSIUM CHLORIDE CRYS ER 20 MEQ PO TBCR
40.0000 meq | EXTENDED_RELEASE_TABLET | Freq: Three times a day (TID) | ORAL | Status: AC
  Administered 2018-06-29 – 2018-07-01 (×6): 40 meq via ORAL
  Filled 2018-06-29 (×7): qty 2

## 2018-06-29 MED ORDER — LEVETIRACETAM 500 MG PO TABS
500.0000 mg | ORAL_TABLET | ORAL | Status: DC
  Administered 2018-06-30 – 2018-07-02 (×3): 500 mg via ORAL
  Filled 2018-06-29 (×4): qty 1

## 2018-06-29 MED ORDER — LEVETIRACETAM 250 MG PO TABS
250.0000 mg | ORAL_TABLET | Freq: Every day | ORAL | Status: DC
  Administered 2018-06-29 – 2018-07-02 (×4): 250 mg via ORAL
  Filled 2018-06-29 (×4): qty 1

## 2018-06-29 NOTE — Care Management Important Message (Signed)
Important Message  Patient Details  Name: Isabella [name] MRN: [id number] Date of Birth: 09-03-1921   Medicare Important Message Given:  Yes    Eleazar Kimmey P Darreld Hoffer 06/29/2018, 3:30 PM

## 2018-06-29 NOTE — Progress Notes (Signed)
NAME:  Isabella [name], MRN:  [id number], DOB:  Jun 08, 1921, LOS: 5 ADMISSION DATE:  06/23/2018, CONSULTATION DATE:  06/24/2018 REFERRING MD:  Manus Gunning MD, CHIEF COMPLAINT:  Altered mental status   Brief History   84 yr old patient with PMHx of spontaneous Tension PTX in February presents to [facility] on 9/25 with Altered mental status, unresponsive at facility, on evaluation found to have a Right sided PTX with no signs of tension. Hemodynamically stable. PCCM consulted for mgmt. CT placed by EDP. Follow up CXR with some mild residual apical pneumothorax.   Significant Hospital Events   Right sided Pneumothorax on presentation  Consults: date of consult/date signed off & final recs:  PCCM  Procedures (surgical and bedside):  Chest Tube (pigtail)- 06/24/18 9/26 > PTX resolved on 20 cmH20 suction, transitioned to water seal 9/27 > Recurrent R PTX on water seal. Placed back to 20 cmH20  Significant Diagnostic Tests:    Subjective:  I found her off suction this pm.  She won't interact with me much, did shake my hand and smiled at me  Objective   Blood pressure (!) 145/97, pulse 63, temperature 97.6 F (36.4 C), temperature source Oral, resp. rate 18, weight 44.5 kg, SpO2 92 %.        Intake/Output Summary (Last 24 hours) at 06/29/2018 1538 Last data filed at 06/29/2018 1300 Gross per 24 hour  Intake 600 ml  Output -  Net 600 ml   Filed Weights   06/27/18 0610 06/28/18 0619 06/29/18 0359  Weight: 44.8 kg 44.6 kg 44.5 kg    Examination: General:  Frail elderly female in NAD Neuro: opens eyes, does not seem to track, does not follow commands, calm.  HEENT:  [place]/AT, No JVD noted, PERRL Cardiovascular:  RRR, no MRG Lungs:  Clear bilateral, R chest tube to suction, no air leak Abdomen:  Soft, non-distended Musculoskeletal:  No acute deformity Skin:  Intact, MMM   Resolved Hospital Problem list     Assessment & Plan:  84 yr old female with PMHx significant for Vascular Dementia,  HTN, Failure to thrive, severe malnutrition, hypernatremia from dehydration, seizures, and CKD stage III  Right sided pneumothorax without tension s/p chest tube placement - pigtail catheter placed 9/25 early AM. Changed to waterseal after improvement 9/26, then 9/27 recurrence of PTX, so placed back to 20cm H20.  9/28 CXR with resolution of apical ptx.  PLAN -  Was off suction when I saw her today. I placed her back on -20 cmH2O, would leave her there until CXR tomorrow am. If no PTX tomorrow am then recommend change to water seal, repeat CXR several hours later. Agree that she is a poor candidate for any kind of surgical intervention. Could consider talc pleurodesis if no resolution.   Advanced dementia  - per primary. - DNR   Disposition / Summary of Today's Plan 06/29/18   Back to suction CXR in am    Diet: Crushed with puree( Aspiration risk)- pt has a h/o dysphagia DVT prophylaxis: enoxaparin  GI prophylaxis: not indicated at this time Hyperglycemia protocol: no h/o insulin dependence. Mobility: bedrest Code Status: DNR Family Communication: no family at bedside on NP rounds 9/28  Labs   CBC: Recent Labs  Lab 06/23/18 2353 06/26/18 0436 06/27/18 0621  WBC 5.7 5.1 5.7  NEUTROABS  --  3.1  --   HGB 12.8 12.6 13.3  HCT 41.8 38.2 41.3  MCV 97.4 91.8 93.7  PLT 178 247 247  Basic Metabolic Panel: Recent Labs  Lab 06/25/18 0346 06/26/18 0436 06/27/18 0621 06/28/18 0644 06/29/18 0427  NA 142 138 136 138 140  K 2.8* 3.1* 3.4* 3.2* 3.1*  CL 112* 105 105 103 107  CO2 25 25 19* 24 26  GLUCOSE 79 81 70 84 97  BUN 10 8 15 12 11   CREATININE 0.62 0.59 0.77 0.64 0.61  CALCIUM 8.2* 8.7* 8.6* 8.9 8.7*  MG 1.7 1.7 2.2  --  1.7   GFR: CrCl cannot be calculated (Unknown ideal weight.). Recent Labs  Lab 06/23/18 2353 06/24/18 0027 06/24/18 0148 06/26/18 0436 06/27/18 0621  WBC 5.7  --   --  5.1 5.7  LATICACIDVEN  --  1.61 1.03  --   --     Liver Function  Tests: Recent Labs  Lab 06/23/18 2353  AST 22  ALT 12  ALKPHOS 74  BILITOT 0.5  PROT 7.2  ALBUMIN 3.4*   No results for input(s): LIPASE, AMYLASE in the last 168 hours. Recent Labs  Lab 06/24/18 0130  AMMONIA 14    ABG    Component Value Date/Time   TCO2 24 03/15/2016 1118     Coagulation Profile: No results for input(s): INR, PROTIME in the last 168 hours.  Cardiac Enzymes: Recent Labs  Lab 06/24/18 0130  TROPONINI <0.03    HbA1C: No results found for: HGBA1C  CBG: Recent Labs  Lab 06/24/18 0034 06/24/18 1441  GLUCAP 107* 74    Admitting History of Present Illness.   84 yr old female with PMHx significant for vascular Dementia, HTN, Failure to thrive, severe malnutrition, hypernatremia from dehydration, seizures, and CKD stage III presents from [facility] with increasing altered mental status, they stated that the pt had slurred speech and seemed different from baseline and was found unresponsive in bed at facility. On arrival to [facility] she was alert but not oriented.  Received workup including a CXR which showed large right sided pneumothorax. PCCM consulted to assist in management.  Levy Pupa, MD, PhD 06/29/2018, 3:41 PM [facility] [phone] or if no answer [phone]

## 2018-06-29 NOTE — Progress Notes (Signed)
[place] TEAM 1 - Stepdown/ICU TEAM  Isabella [name]  QQV:[id number] DOB: [birth year] DOA: 06/23/2018 PCP: Mortimer Fries, PA    Brief Narrative:  84 year old female with a history of severe dementia, seizure disorder, hypertension, and recurrent spontaneous pneumothorax who was sent from her SNF to the ED due to altered mental status and lethargy.  In the ED she was found to have decreased breath sounds on the right, and a chest x-ray showed a large right-sided pneumothorax.  Head CT was negative for acute finding.  Patient was admitted with working diagnosis of recurrent spontaneous pneumothorax.    Subjective: Mental status is stable. No evidence of discomfort or resp distress.     Assessment & Plan:  Recurrent spontaneous pneumothorax Admit February 2019 w/ tension pneumothorax, now back with spontaneous non-tension pneumothorax - PCCM placed R sided pigtail chest tube 9/25 - management per PCCM - appears to be stable clinically  Acute metabolic encephalopathy in setting of severe dementia Pt has returned to her baseline   Hypernatremia due to dehydration - resolved after hydration  Hypokalemia Cont to supplement and follow - goal is 4.0  Modest hypomagnesemia  Corrected   AKI due to dehydration - creatinine back to baseline after IV fluids  Seizure d/o Continue Keppra  HTN  BP in acceptable range for this pt - resumed oral meds   DVT prophylaxis: lovenox  Code Status: DNR - NO CODE Family Communication: no family present at time of exam today  Disposition Plan: SDU  Consultants:  PCCM  Antimicrobials:  none  Objective: Blood pressure (!) 145/97, pulse 63, temperature 97.6 F (36.4 C), temperature source Oral, resp. rate 18, weight 44.5 kg, SpO2 92 %.  Intake/Output Summary (Last 24 hours) at 06/29/2018 1635 Last data filed at 06/29/2018 1300 Gross per 24 hour  Intake 600 ml  Output -  Net 600 ml   Filed Weights   06/27/18 0610 06/28/18 0619 06/29/18 0359    Weight: 44.8 kg 44.6 kg 44.5 kg    Examination: General: No acute respiratory distress   Lungs: CTA B w/ air movement in B fields  Cardiovascular: RRR Abdomen: NT/ND, soft, BS+ Extremities: no signif E B LE   CBC: Recent Labs  Lab 06/23/18 2353 06/26/18 0436 06/27/18 0621  WBC 5.7 5.1 5.7  NEUTROABS  --  3.1  --   HGB 12.8 12.6 13.3  HCT 41.8 38.2 41.3  MCV 97.4 91.8 93.7  PLT 178 247 247   Basic Metabolic Panel: Recent Labs  Lab 06/26/18 0436 06/27/18 0621 06/28/18 0644 06/29/18 0427  NA 138 136 138 140  K 3.1* 3.4* 3.2* 3.1*  CL 105 105 103 107  CO2 25 19* 24 26  GLUCOSE 81 70 84 97  BUN 8 15 12 11   CREATININE 0.59 0.77 0.64 0.61  CALCIUM 8.7* 8.6* 8.9 8.7*  MG 1.7 2.2  --  1.7   GFR: CrCl cannot be calculated (Unknown ideal weight.).  Liver Function Tests: Recent Labs  Lab 06/23/18 2353  AST 22  ALT 12  ALKPHOS 74  BILITOT 0.5  PROT 7.2  ALBUMIN 3.4*   Recent Labs  Lab 06/24/18 0130  AMMONIA 14   Cardiac Enzymes: Recent Labs  Lab 06/24/18 0130  TROPONINI <0.03   CBG: Recent Labs  Lab 06/24/18 0034 06/24/18 1441  GLUCAP 107* 74    Recent Results (from the past 240 hour(s))  Blood culture (routine x 2)     Status: None   Collection Time: 06/24/18  1:30 AM  Result Value Ref Range Status   Specimen Description BLOOD RIGHT FOREARM  Final   Special Requests   Final    BOTTLES DRAWN AEROBIC AND ANAEROBIC Blood Culture results may not be optimal due to an inadequate volume of blood received in culture bottles   Culture   Final    NO GROWTH 5 DAYS Performed at [facility], [address], [place], [place] [address]    Report Status 06/29/2018 FINAL  Final  Blood culture (routine x 2)     Status: None   Collection Time: 06/24/18  1:45 AM  Result Value Ref Range Status   Specimen Description BLOOD LEFT ANTECUBITAL  Final   Special Requests   Final    BOTTLES DRAWN AEROBIC AND ANAEROBIC Blood Culture adequate volume   Culture    Final    NO GROWTH 5 DAYS Performed at [facility], [address] [place], [place] [address]    Report Status 06/29/2018 FINAL  Final  Urine Culture     Status: None   Collection Time: 06/24/18  4:52 AM  Result Value Ref Range Status   Specimen Description URINE, CATHETERIZED  Final   Special Requests NONE  Final   Culture   Final    NO GROWTH Performed at [facility], [address], [place], [place] [address]    Report Status 06/25/2018 FINAL  Final  MRSA PCR Screening     Status: None   Collection Time: 06/24/18  6:30 AM  Result Value Ref Range Status   MRSA by PCR NEGATIVE NEGATIVE Final    Comment:        The GeneXpert MRSA Assay (FDA approved for NASAL specimens only), is one component of a comprehensive MRSA colonization surveillance program. It is not intended to diagnose MRSA infection nor to guide or monitor treatment for MRSA infections. Performed at [facility], [address], [place], [place] [address]      Scheduled Meds: . enoxaparin (LOVENOX) injection  30 mg Subcutaneous Q24H  . feeding supplement  1 Container Oral TID BM  . levETIRAcetam  250 mg Oral Q2000  . [START ON 06/30/2018] levETIRAcetam  500 mg Oral Q24H  . metoprolol tartrate  12.5 mg Oral BID  . polyvinyl alcohol  1 drop Both Eyes BID  . potassium chloride  40 mEq Oral BID     LOS: 5 days   Lonia Blood, MD [facility] Office  [phone] Pager - Text Page per Loretha Stapler  If 7PM-7AM, please contact night-coverage per Amion 06/29/2018, 4:35 PM

## 2018-06-30 ENCOUNTER — Inpatient Hospital Stay (HOSPITAL_COMMUNITY): Payer: Medicare Other

## 2018-06-30 LAB — BASIC METABOLIC PANEL
ANION GAP: 10 (ref 5–15)
BUN: 13 mg/dL (ref 8–23)
CO2: 23 mmol/L (ref 22–32)
Calcium: 9.1 mg/dL (ref 8.9–10.3)
Chloride: 107 mmol/L (ref 98–111)
Creatinine, Ser: 0.73 mg/dL (ref 0.44–1.00)
GFR calc non Af Amer: >60 mL/min (ref >60)
Glucose, Bld: 92 mg/dL (ref 70–99)
Potassium: 4.5 mmol/L (ref 3.5–5.1)
Sodium: 140 mmol/L (ref 135–145)

## 2018-06-30 NOTE — Progress Notes (Signed)
NAME:  Isabella [name], MRN:  [id number], DOB:  18-Aug-1921, LOS: 6 ADMISSION DATE:  06/23/2018, CONSULTATION DATE:  06/24/2018 REFERRING MD:  Manus Gunning MD, CHIEF COMPLAINT:  Altered mental status   Brief History   84 yr old patient with PMHx of spontaneous Tension PTX in February presents to [facility] on 9/25 with Altered mental status, unresponsive at facility, on evaluation found to have a Right sided PTX with no signs of tension. Hemodynamically stable. PCCM consulted for mgmt. CT placed by EDP. Follow up CXR with some mild residual apical pneumothorax.   Significant Hospital Events   Right sided Pneumothorax on presentation  Consults: date of consult/date signed off & final recs:  PCCM  Procedures (surgical and bedside):  Chest Tube (pigtail)- 06/24/18 9/26 > PTX resolved on 20 cmH20 suction, transitioned to water seal 9/27 > Recurrent R PTX on water seal. Placed back to 20 cmH20 06/30/2018 Place chest tube to waterseal  Significant Diagnostic Tests:    Subjective:  Currently on 20 cm of suction to right chest No acute distress at rest  Objective   Blood pressure (!) 151/93, pulse 80, temperature 98.3 F (36.8 C), temperature source Oral, resp. rate 14, weight 44.5 kg, SpO2 98 %.        Intake/Output Summary (Last 24 hours) at 06/30/2018 0925 Last data filed at 06/30/2018 0400 Gross per 24 hour  Intake 480 ml  Output 10 ml  Net 470 ml   Filed Weights   06/27/18 0610 06/28/18 0619 06/29/18 0359  Weight: 44.8 kg 44.6 kg 44.5 kg    Examination: General: Elderly female who arouses and follows commands HEENT: No JVD or lymphadenopathy Neuro: Pleasant follows commands CV: Sounds are regular PULM: even/non-labored, lungs bilaterally decreased throughout WU:JWJX, non-tender, bsx4 active  Extremities: warm/dry, negative edema  Skin: no rashes or lesions    Resolved Hospital Problem list     Assessment & Plan:  84 yr old female with PMHx significant for Vascular Dementia, HTN,  Failure to thrive, severe malnutrition, hypernatremia from dehydration, seizures, and CKD stage III  Right sided pneumothorax without tension s/p chest tube placement - pigtail catheter placed 9/25 early AM. Changed to waterseal after improvement 9/26, then 9/27 recurrence of PTX, so placed back to 20cm H20.  9/28 CXR with resolution of apical ptx.  PLAN -  Vent on suction 20 cm for 24-hour 06/30/2018 change to waterseal In a.m. of 07/01/2018 chest x-ray if pneumo remains resolved consider pulling chest. Consider not doing follow-up chest x-rays once chest tube is been removed.  Advanced dementia   -DNR  -Per primary   Disposition / Summary of Today's Plan 06/30/18   Right chest tube is been to suction for the last 24 hours.  Will change to waterseal on 06/30/2018 Check chest x-ray in the a.m. if no pneumo will consider discontinuing chest tube.     Diet: Crushed with puree( Aspiration risk)- pt has a h/o dysphagia DVT prophylaxis: enoxaparin  GI prophylaxis: not indicated at this time Hyperglycemia protocol: no h/o insulin dependence. Mobility: bedrest Code Status: DNR Family 09/2017 no family bedside  Labs   CBC: Recent Labs  Lab 06/23/18 2353 06/26/18 0436 06/27/18 0621  WBC 5.7 5.1 5.7  NEUTROABS  --  3.1  --   HGB 12.8 12.6 13.3  HCT 41.8 38.2 41.3  MCV 97.4 91.8 93.7  PLT 178 247 247    Basic Metabolic Panel: Recent Labs  Lab 06/25/18 0346 06/26/18 0436 06/27/18 9147 06/28/18 8295 06/29/18 0427  06/30/18 0328  NA 142 138 136 138 140 140  K 2.8* 3.1* 3.4* 3.2* 3.1* 4.5  CL 112* 105 105 103 107 107  CO2 25 25 19* 24 26 23   GLUCOSE 79 81 70 84 97 92  BUN 10 8 15 12 11 13   CREATININE 0.62 0.59 0.77 0.64 0.61 0.73  CALCIUM 8.2* 8.7* 8.6* 8.9 8.7* 9.1  MG 1.7 1.7 2.2  --  1.7  --    GFR: CrCl cannot be calculated (Unknown ideal weight.). Recent Labs  Lab 06/23/18 2353 06/24/18 0027 06/24/18 0148 06/26/18 0436 06/27/18 0621  WBC 5.7  --   --  5.1 5.7   LATICACIDVEN  --  1.61 1.03  --   --     Liver Function Tests: Recent Labs  Lab 06/23/18 2353  AST 22  ALT 12  ALKPHOS 74  BILITOT 0.5  PROT 7.2  ALBUMIN 3.4*   No results for input(s): LIPASE, AMYLASE in the last 168 hours. Recent Labs  Lab 06/24/18 0130  AMMONIA 14    ABG    Component Value Date/Time   TCO2 24 03/15/2016 1118     Coagulation Profile: No results for input(s): INR, PROTIME in the last 168 hours.  Cardiac Enzymes: Recent Labs  Lab 06/24/18 0130  TROPONINI <0.03    HbA1C: No results found for: HGBA1C  CBG: Recent Labs  Lab 06/24/18 0034 06/24/18 1441  GLUCAP 107* 74    Admitting History of Present Illness.   84 yr old female with PMHx significant for vascular Dementia, HTN, Failure to thrive, severe malnutrition, hypernatremia from dehydration, seizures, and CKD stage III presents from [facility] with increasing altered mental status, they stated that the pt had slurred speech and seemed different from baseline and was found unresponsive in bed at facility. On arrival to [facility] she was alert but not oriented.  Received workup including a CXR which showed large right sided pneumothorax. PCCM consulted to assist in management.  Brett Canales Minor ACNP [facility] PCCM Pager [phone] till 1 pm If no answer page [phone] 06/30/2018, 9:25 AM

## 2018-06-30 NOTE — Progress Notes (Signed)
[place] TEAM 1 - Stepdown/ICU TEAM  Cristela Stalder  BJY:[id number] DOB: January 20, 1921 DOA: 06/23/2018 PCP: Mortimer Fries, PA    Brief Narrative:  84 year old female with a history of severe dementia, seizure disorder, hypertension, and recurrent spontaneous pneumothorax who was sent from her SNF to the ED due to altered mental status and lethargy.  In the ED she was found to have decreased breath sounds on the right, and a chest x-ray showed a large right-sided pneumothorax.  Head CT was negative for acute finding.  Patient was admitted with working diagnosis of recurrent spontaneous pneumothorax.    Subjective: Resting comfortably in bed. No evidence of acute distress or uncontrolled pain.     Assessment & Plan:  Recurrent spontaneous pneumothorax Admit February 2019 w/ tension pneumothorax, now back with spontaneous non-tension pneumothorax - PCCM placed R sided pigtail chest tube 9/25 - management per PCCM - appears to be stable clinically - chest tube now on water seal - hopeful for no recurrence of PTX on f/u CXR in AM   Acute metabolic encephalopathy in setting of severe dementia Pt has returned to her baseline   Hypernatremia due to dehydration - resolved after hydration  Hypokalemia Corrected to goal   Modest hypomagnesemia  Corrected   AKI due to dehydration - creatinine back to baseline after IV fluids  Seizure d/o Continue Keppra  HTN  BP in acceptable range for this pt - resumed oral meds   DVT prophylaxis: lovenox  Code Status: DNR - NO CODE Family Communication: no family present at time of exam today  Disposition Plan: SDU - will be ready to return to SNF as soon as chest tube able to be removed  Consultants:  PCCM  Antimicrobials:  none  Objective: Blood pressure (!) 151/93, pulse 80, temperature 98.3 F (36.8 C), temperature source Oral, resp. rate 14, weight 44.5 kg, SpO2 98 %.  Intake/Output Summary (Last 24 hours) at 06/30/2018 1151 Last data  filed at 06/30/2018 0958 Gross per 24 hour  Intake 520 ml  Output 10 ml  Net 510 ml   Filed Weights   06/27/18 0610 06/28/18 0619 06/29/18 0359  Weight: 44.8 kg 44.6 kg 44.5 kg    Examination: General: No acute respiratory distress   Lungs: CTA B w/ air movement in B fields  Cardiovascular: RRR Abdomen: NT/ND, soft, BS+ Extremities: no edema B LE   CBC: Recent Labs  Lab 06/23/18 2353 06/26/18 0436 06/27/18 0621  WBC 5.7 5.1 5.7  NEUTROABS  --  3.1  --   HGB 12.8 12.6 13.3  HCT 41.8 38.2 41.3  MCV 97.4 91.8 93.7  PLT 178 247 247   Basic Metabolic Panel: Recent Labs  Lab 06/26/18 0436 06/27/18 0621 06/28/18 0644 06/29/18 0427 06/30/18 0328  NA 138 136 138 140 140  K 3.1* 3.4* 3.2* 3.1* 4.5  CL 105 105 103 107 107  CO2 25 19* 24 26 23   GLUCOSE 81 70 84 97 92  BUN 8 15 12 11 13   CREATININE 0.59 0.77 0.64 0.61 0.73  CALCIUM 8.7* 8.6* 8.9 8.7* 9.1  MG 1.7 2.2  --  1.7  --    GFR: CrCl cannot be calculated (Unknown ideal weight.).  Liver Function Tests: Recent Labs  Lab 06/23/18 2353  AST 22  ALT 12  ALKPHOS 74  BILITOT 0.5  PROT 7.2  ALBUMIN 3.4*   Recent Labs  Lab 06/24/18 0130  AMMONIA 14   Cardiac Enzymes: Recent Labs  Lab 06/24/18 0130  TROPONINI <0.03   CBG: Recent Labs  Lab 06/24/18 0034 06/24/18 1441  GLUCAP 107* 74    Recent Results (from the past 240 hour(s))  Blood culture (routine x 2)     Status: None   Collection Time: 06/24/18  1:30 AM  Result Value Ref Range Status   Specimen Description BLOOD RIGHT FOREARM  Final   Special Requests   Final    BOTTLES DRAWN AEROBIC AND ANAEROBIC Blood Culture results may not be optimal due to an inadequate volume of blood received in culture bottles   Culture   Final    NO GROWTH 5 DAYS Performed at [facility], [address] [place], [place] [address]    Report Status 06/29/2018 FINAL  Final  Blood culture (routine x 2)     Status: None   Collection Time: 06/24/18  1:45  AM  Result Value Ref Range Status   Specimen Description BLOOD LEFT ANTECUBITAL  Final   Special Requests   Final    BOTTLES DRAWN AEROBIC AND ANAEROBIC Blood Culture adequate volume   Culture   Final    NO GROWTH 5 DAYS Performed at [facility], [address], [place], [place] [address]    Report Status 06/29/2018 FINAL  Final  Urine Culture     Status: None   Collection Time: 06/24/18  4:52 AM  Result Value Ref Range Status   Specimen Description URINE, CATHETERIZED  Final   Special Requests NONE  Final   Culture   Final    NO GROWTH Performed at [facility], [address] [place], [place] [address]    Report Status 06/25/2018 FINAL  Final  MRSA PCR Screening     Status: None   Collection Time: 06/24/18  6:30 AM  Result Value Ref Range Status   MRSA by PCR NEGATIVE NEGATIVE Final    Comment:        The GeneXpert MRSA Assay (FDA approved for NASAL specimens only), is one component of a comprehensive MRSA colonization surveillance program. It is not intended to diagnose MRSA infection nor to guide or monitor treatment for MRSA infections. Performed at [facility], [address], [place], [place] [address]      Scheduled Meds: . enoxaparin (LOVENOX) injection  30 mg Subcutaneous Q24H  . feeding supplement  1 Container Oral TID BM  . levETIRAcetam  250 mg Oral Q2000  . levETIRAcetam  500 mg Oral Q24H  . metoprolol tartrate  12.5 mg Oral BID  . polyvinyl alcohol  1 drop Both Eyes BID  . potassium chloride  40 mEq Oral TID     LOS: 6 days   Lonia Blood, MD [facility] Office  [phone] Pager - Text Page per Loretha Stapler  If 7PM-7AM, please contact night-coverage per Amion 06/30/2018, 11:51 AM

## 2018-06-30 NOTE — Progress Notes (Signed)
Clinical Social Worker following patient for support and discharge needs. Patient is from [facility]. CSW following for disposition plan once patient becomes medically stable.   Marrianne Mood, MSW,  [employer] [phone]

## 2018-07-01 ENCOUNTER — Inpatient Hospital Stay (HOSPITAL_COMMUNITY): Payer: Medicare Other

## 2018-07-01 DIAGNOSIS — J9311 Primary spontaneous pneumothorax: Secondary | ICD-10-CM

## 2018-07-01 NOTE — Progress Notes (Signed)
NAME:  Isabella [name], MRN:  [id number], DOB:  [birth year], LOS: 7 ADMISSION DATE:  06/23/2018, CONSULTATION DATE:  06/24/2018 REFERRING MD:  Manus Gunning MD, CHIEF COMPLAINT:  Altered mental status   Brief History   84 yr old patient with PMHx of spontaneous tension PTX in February presents to [facility] on 9/25 with Altered mental status, unresponsive at facility, on evaluation found to have a Right sided PTX with no signs of tension. Hemodynamically stable. PCCM consulted for mgmt. CT placed by EDP. Follow up CXR with some mild residual apical pneumothorax.   Significant Hospital Events   Right sided Pneumothorax on presentation  Consults: date of consult/date signed off & final recs:  PCCM  Procedures (surgical and bedside):  Chest Tube (pigtail)- 06/24/18 9/26 > PTX resolved on 20 cmH20 suction, transitioned to water seal 9/27 > Recurrent R PTX on water seal. Placed back to 20 cmH20 06/30/2018 Place chest tube to waterseal 10/2 > chest tube placed back to suction  Significant Diagnostic Tests:    Subjective:  Repeat CXR with 15% PTX.  Chest tube placed back to suction.   Objective   Blood pressure 122/80, pulse 79, temperature 97.6 F (36.4 C), temperature source Axillary, resp. rate 18, weight 43.5 kg, SpO2 99 %.        Intake/Output Summary (Last 24 hours) at 07/01/2018 1038 Last data filed at 07/01/2018 0400 Gross per 24 hour  Intake 237 ml  Output 30 ml  Net 207 ml   Filed Weights   06/28/18 0619 06/29/18 0359 07/01/18 0349  Weight: 44.6 kg 44.5 kg 43.5 kg    Examination: General: Elderly female, in NAD  HEENT: No JVD or lymphadenopathy Neuro: Awake, tracks, but not answering questions or following commands CV: RRR, no M/R/G PULM: even/non-labored, lungs bilaterally decreased throughout, right chest tube in place with no air leak GB:TDVV, non-tender, bsx4 active  Extremities: warm/dry, negative edema  Skin: no rashes or lesions   Resolved Hospital Problem list      Assessment & Plan:  84 yr old female with PMHx significant for Vascular Dementia, HTN, Failure to thrive, severe malnutrition, hypernatremia from dehydration, seizures, and CKD stage III  Right sided pneumothorax without tension s/p chest tube placement - pigtail catheter placed 9/25 early AM. Changed to waterseal after improvement 9/26, then 9/27 recurrence of PTX, so placed back to suction 9/28. 10/1 placed to waterseal but unfortunately 10/2 recurrent 15% PTX so once again, placed to suction. Place chest tube back to suction Unfortunately difficult situation as likely not a surgical candidate and talc pleurodesis would be difficult to perform with small bore chest tube in place, will discuss with MD Repeat CXR in AM  Advanced dementia   -DNR  - Per primary   Rest per primary team.  Disposition / Summary of Today's Plan 07/01/18   Right chest tube back to suction this AM 10/2. Unfortunately difficult situation as likely not a surgical candidate and talc pleurodesis would be difficult to perform with small bore chest tube in place Repeat CXR in AM    Mobility: bedrest Code Status: DNR No family at bedside 10/2  Labs   CBC: Recent Labs  Lab 06/26/18 0436 06/27/18 0621  WBC 5.1 5.7  NEUTROABS 3.1  --   HGB 12.6 13.3  HCT 38.2 41.3  MCV 91.8 93.7  PLT 247 247    Basic Metabolic Panel: Recent Labs  Lab 06/25/18 0346 06/26/18 0436 06/27/18 0621 06/28/18 0644 06/29/18 0427 06/30/18 0328  NA 142  138 136 138 140 140  K 2.8* 3.1* 3.4* 3.2* 3.1* 4.5  CL 112* 105 105 103 107 107  CO2 25 25 19* 24 26 23   GLUCOSE 79 81 70 84 97 92  BUN 10 8 15 12 11 13   CREATININE 0.62 0.59 0.77 0.64 0.61 0.73  CALCIUM 8.2* 8.7* 8.6* 8.9 8.7* 9.1  MG 1.7 1.7 2.2  --  1.7  --    GFR: CrCl cannot be calculated (Unknown ideal weight.). Recent Labs  Lab 06/26/18 0436 06/27/18 0621  WBC 5.1 5.7    Liver Function Tests: No results for input(s): AST, ALT, ALKPHOS, BILITOT, PROT,  ALBUMIN in the last 168 hours. No results for input(s): LIPASE, AMYLASE in the last 168 hours. No results for input(s): AMMONIA in the last 168 hours.  ABG    Component Value Date/Time   TCO2 24 03/15/2016 1118     Coagulation Profile: No results for input(s): INR, PROTIME in the last 168 hours.  Cardiac Enzymes: No results for input(s): CKTOTAL, CKMB, CKMBINDEX, TROPONINI in the last 168 hours.  HbA1C: No results found for: HGBA1C  CBG: Recent Labs  Lab 06/24/18 1441  GLUCAP 74    Admitting History of Present Illness.   84 yr old female with PMHx significant for vascular Dementia, HTN, Failure to thrive, severe malnutrition, hypernatremia from dehydration, seizures, and CKD stage III presents from [facility] with increasing altered mental status, they stated that the pt had slurred speech and seemed different from baseline and was found unresponsive in bed at facility. On arrival to [facility] she was alert but not oriented.  Received workup including a CXR which showed large right sided pneumothorax. PCCM consulted to assist in management.   Rutherford Guys, [place] [facility] Pager: [phone]  or [phone] 07/01/2018, 10:46 AM

## 2018-07-01 NOTE — Progress Notes (Addendum)
[place] TEAM 1 - Stepdown/ICU TEAM  Tanda Morrissey  FAO:[id number] DOB: 09-28-1921 DOA: 06/23/2018 PCP: Mortimer Fries, PA    Brief Narrative:  84 year old female with a history of severe dementia, seizure disorder, hypertension, and recurrent spontaneous pneumothorax was transferred from SNF to the ED due to altered mental status and lethargy.  In the ED, patient was found to have decreased breath sounds on the right, and a chest x-ray showed a large right-sided pneumothorax.  Head CT was negative for acute finding.  Patient was admitted with recurrent spontaneous pneumothorax.    Subjective: Patient has advanced dementia.  Did not verbalize  Assessment & Plan:  Recurrent spontaneous pneumothorax.  Patient does have a history of recurrent pneumothorax in the past and was admitted in February 2019 for tension pneumothorax.  This time, patient does not have tension pneumothorax.  PCCM on board and patient received a right-sided pigtail catheter on 06/24/2018.  It was put on waterseal but today, due to increased percentage of pneumothorax repeat x-ray, chest tube has been put on suction.  PCCM to follow.  Acute metabolic encephalopathy in setting of severe dementia Patient has advanced dementia.  Hypernatremia Likely secondary to dehydration.  Resolved at this time.  We will continue to monitor.  Hypokalemia Has been corrected.  Will monitor BMP in a.m.  Decreased dose of potassium.  Hypomagnesemia. Corrected   Acute kidney injury  ikely secondary to volume depletion and dehydration.   Improved after IV fluids.  History of seizure disorder. Continue Keppra, no seizure episodes noted in the hospital.  HTN  We will closely monitor blood pressure.  On oral medication.  DVT prophylaxis: Enoxaparin   Code Status: DNR - NO CODE  Family Communication: no one available at bedside.  Disposition Plan: Skilled nursing facility.  Still on chest tube with suction.  Consultants:   PCCM  Antimicrobials:  none  Objective: Blood pressure 122/80, pulse 79, temperature 97.6 F (36.4 C), temperature source Axillary, resp. rate 18, weight 43.5 kg, SpO2 99 %.  Intake/Output Summary (Last 24 hours) at 07/01/2018 1215 Last data filed at 07/01/2018 0400 Gross per 24 hour  Intake 237 ml  Output 30 ml  Net 207 ml   Filed Weights   06/28/18 0619 06/29/18 0359 07/01/18 0349  Weight: 44.6 kg 44.5 kg 43.5 kg    Examination: General: No acute respiratory distress, cachectic Lungs: Diminished breath sounds bilaterally.  Right-sided chest tube on suction. Cardiovascular: RRR, S1-S2 heard Abdomen: NT/ND, soft, BS+ Extremities: no edema B LE  CNS: Moves all extremities, alert awake, nonverbal  CBC: Recent Labs  Lab 06/26/18 0436 06/27/18 0621  WBC 5.1 5.7  NEUTROABS 3.1  --   HGB 12.6 13.3  HCT 38.2 41.3  MCV 91.8 93.7  PLT 247 247   Basic Metabolic Panel: Recent Labs  Lab 06/26/18 0436 06/27/18 0621 06/28/18 0644 06/29/18 0427 06/30/18 0328  NA 138 136 138 140 140  K 3.1* 3.4* 3.2* 3.1* 4.5  CL 105 105 103 107 107  CO2 25 19* 24 26 23   GLUCOSE 81 70 84 97 92  BUN 8 15 12 11 13   CREATININE 0.59 0.77 0.64 0.61 0.73  CALCIUM 8.7* 8.6* 8.9 8.7* 9.1  MG 1.7 2.2  --  1.7  --    GFR: CrCl cannot be calculated (Unknown ideal weight.).  Liver Function Tests: No results for input(s): AST, ALT, ALKPHOS, BILITOT, PROT, ALBUMIN in the last 168 hours. No results for input(s): AMMONIA in the last 168 hours.  Cardiac Enzymes: No results for input(s): CKTOTAL, CKMB, CKMBINDEX, TROPONINI in the last 168 hours. CBG: Recent Labs  Lab 06/24/18 1441  GLUCAP 74    Recent Results (from the past 240 hour(s))  Blood culture (routine x 2)     Status: None   Collection Time: 06/24/18  1:30 AM  Result Value Ref Range Status   Specimen Description BLOOD RIGHT FOREARM  Final   Special Requests   Final    BOTTLES DRAWN AEROBIC AND ANAEROBIC Blood Culture results may  not be optimal due to an inadequate volume of blood received in culture bottles   Culture   Final    NO GROWTH 5 DAYS Performed at [facility], [address], [place], [place] [address]    Report Status 06/29/2018 FINAL  Final  Blood culture (routine x 2)     Status: None   Collection Time: 06/24/18  1:45 AM  Result Value Ref Range Status   Specimen Description BLOOD LEFT ANTECUBITAL  Final   Special Requests   Final    BOTTLES DRAWN AEROBIC AND ANAEROBIC Blood Culture adequate volume   Culture   Final    NO GROWTH 5 DAYS Performed at [facility], [address] [place], [place] [address]    Report Status 06/29/2018 FINAL  Final  Urine Culture     Status: None   Collection Time: 06/24/18  4:52 AM  Result Value Ref Range Status   Specimen Description URINE, CATHETERIZED  Final   Special Requests NONE  Final   Culture   Final    NO GROWTH Performed at [facility], [address], [place], [place] [address]    Report Status 06/25/2018 FINAL  Final  MRSA PCR Screening     Status: None   Collection Time: 06/24/18  6:30 AM  Result Value Ref Range Status   MRSA by PCR NEGATIVE NEGATIVE Final    Comment:        The GeneXpert MRSA Assay (FDA approved for NASAL specimens only), is one component of a comprehensive MRSA colonization surveillance program. It is not intended to diagnose MRSA infection nor to guide or monitor treatment for MRSA infections. Performed at [facility], [address] [place], [place] [address]      Scheduled Meds: . enoxaparin (LOVENOX) injection  30 mg Subcutaneous Q24H  . feeding supplement  1 Container Oral TID BM  . levETIRAcetam  250 mg Oral Q2000  . levETIRAcetam  500 mg Oral Q24H  . metoprolol tartrate  12.5 mg Oral BID  . polyvinyl alcohol  1 drop Both Eyes BID  . potassium chloride  40 mEq Oral TID     LOS: 7 days   Loistine Chance, MD  If 7PM-7AM, please contact night-coverage per Amion 07/01/2018,  12:15 PM

## 2018-07-02 ENCOUNTER — Inpatient Hospital Stay (HOSPITAL_COMMUNITY): Payer: Medicare Other

## 2018-07-02 DIAGNOSIS — E43 Unspecified severe protein-calorie malnutrition: Secondary | ICD-10-CM

## 2018-07-02 LAB — BASIC METABOLIC PANEL
Anion gap: 7 (ref 5–15)
BUN: 9 mg/dL (ref 8–23)
CO2: 28 mmol/L (ref 22–32)
Calcium: 8.9 mg/dL (ref 8.9–10.3)
Chloride: 101 mmol/L (ref 98–111)
Creatinine, Ser: 0.72 mg/dL (ref 0.44–1.00)
GFR calc Af Amer: >60 mL/min (ref >60)
GFR calc non Af Amer: >60 mL/min (ref >60)
GLUCOSE: 102 mg/dL — AB (ref 70–99)
POTASSIUM: 3.9 mmol/L (ref 3.5–5.1)
Sodium: 136 mmol/L (ref 135–145)

## 2018-07-02 LAB — CBC
HEMATOCRIT: 41 % (ref 36.0–46.0)
HEMOGLOBIN: 13.1 g/dL (ref 12.0–15.0)
MCH: 29.7 pg (ref 26.0–34.0)
MCHC: 32 g/dL (ref 30.0–36.0)
MCV: 93 fL (ref 78.0–100.0)
Platelets: 257 10*3/uL (ref 150–400)
RBC: 4.41 MIL/uL (ref 3.87–5.11)
RDW: 14.1 % (ref 11.5–15.5)
WBC: 5.4 10*3/uL (ref 4.0–10.5)

## 2018-07-02 MED ORDER — ADULT MULTIVITAMIN W/MINERALS CH
1.0000 | ORAL_TABLET | Freq: Every day | ORAL | Status: DC
  Administered 2018-07-02 – 2018-07-07 (×5): 1 via ORAL
  Filled 2018-07-02 (×5): qty 1

## 2018-07-02 NOTE — Care Management Important Message (Signed)
Important Message  Patient Details  Name: Isabella [name] MRN: [id number] Date of Birth: March 19, 1921   Medicare Important Message Given:  Yes    Katrinia Straker P Deneene Tarver 07/02/2018, 1:29 PM

## 2018-07-02 NOTE — Progress Notes (Addendum)
[facility] TEAM 1 - Stepdown/ICU TEAM  Isabella [name]  ZOX:[id number] DOB: 07/22/1921 DOA: 06/23/2018 PCP: Mortimer Fries, PA    Brief Narrative:  84 year old female with a history of severe dementia, seizure disorder, hypertension, and recurrent spontaneous pneumothorax was transferred today shows SNF to the ED due to altered mental status and lethargy.  In the ED, patient was found to have decreased breath sounds on the right, and a chest x-ray showed a large right-sided pneumothorax.  Head CT was negative for acute findings.  Patient was the admitted with recurrent spontaneous pneumothorax.    Subjective: Patient has advanced dementia.  Patient did verbalize some today but could not answer questions.  Assessment & Plan:  Recurrent spontaneous pneumothorax.  Patient does have a history of recurrent pneumothorax in the past and was admitted in February 2019 for tension pneumothorax.   PCCM on board status post right-sided pigtail catheter on 06/24/2018.  Chest tube has been put on suction at this time.  X-ray from today shows resolution of pneumothorax.  Mild effusion noted.   PCCM to follow.   Acute metabolic encephalopathy in setting of severe dementia Patient has advanced dementia.  Appears more alert awake and verbalizing today.  Hypernatremia  Likely secondary to dehydration.  Resolved at this time.  We will continue to monitor.  Hypokalemia Has been corrected.  Will monitor BMP in a.m.  Decreased dose of potassium.  Hypomagnesemia. Corrected   Acute kidney injury  Secondary to volume depletion and dehydration.   Improved after IV fluids.  History of seizure disorder. Continue Keppra, no seizure episodes noted in the hospital.  Severe protein calorie malnutrition on admission.   Nutrition on board.  Will provide nutritional support.  HTN  We will closely monitor blood pressure.  On oral medication.  DVT prophylaxis: Enoxaparin  Code Status: DNR - NO CODE  Family  Communication: I spoke with the patient's daughter on the phone and updated her about the clinical condition of the patient..  Disposition Plan: Skilled nursing facility.  Still on chest tube with suction.  Follow PCCM input.  Check labs in a.m.  Consultants:  PCCM  Antimicrobials:  none  Objective: Blood pressure 119/63, pulse 62, temperature 98 F (36.7 C), temperature source Oral, resp. rate 15, weight 45.6 kg, SpO2 99 %.  Intake/Output Summary (Last 24 hours) at 07/02/2018 0851 Last data filed at 07/02/2018 0600 Gross per 24 hour  Intake 471 ml  Output 20 ml  Net 451 ml   Filed Weights   06/29/18 0359 07/01/18 0349 07/02/18 0558  Weight: 44.5 kg 43.5 kg 45.6 kg    Examination: General: No acute respiratory distress, cachectic Lungs: Diminished breath sounds bilaterally.  Right-sided chest tube on suction. Cardiovascular: RRR, S1-S2 heard Abdomen: NT/ND, soft, BS+ Extremities: no edema B LE  CNS: Moves all extremities, alert awake, nonverbal  CBC: Recent Labs  Lab 06/26/18 0436 06/27/18 0621  WBC 5.1 5.7  NEUTROABS 3.1  --   HGB 12.6 13.3  HCT 38.2 41.3  MCV 91.8 93.7  PLT 247 247   Basic Metabolic Panel: Recent Labs  Lab 06/26/18 0436 06/27/18 0621 06/28/18 0644 06/29/18 0427 06/30/18 0328  NA 138 136 138 140 140  K 3.1* 3.4* 3.2* 3.1* 4.5  CL 105 105 103 107 107  CO2 25 19* 24 26 23   GLUCOSE 81 70 84 97 92  BUN 8 15 12 11 13   CREATININE 0.59 0.77 0.64 0.61 0.73  CALCIUM 8.7* 8.6* 8.9 8.7* 9.1  MG  1.7 2.2  --  1.7  --    GFR: CrCl cannot be calculated (Unknown ideal weight.).  Liver Function Tests: No results for input(s): AST, ALT, ALKPHOS, BILITOT, PROT, ALBUMIN in the last 168 hours. No results for input(s): AMMONIA in the last 168 hours. Cardiac Enzymes: No results for input(s): CKTOTAL, CKMB, CKMBINDEX, TROPONINI in the last 168 hours. CBG: No results for input(s): GLUCAP in the last 168 hours.  Recent Results (from the past 240  hour(s))  Blood culture (routine x 2)     Status: None   Collection Time: 06/24/18  1:30 AM  Result Value Ref Range Status   Specimen Description BLOOD RIGHT FOREARM  Final   Special Requests   Final    BOTTLES DRAWN AEROBIC AND ANAEROBIC Blood Culture results may not be optimal due to an inadequate volume of blood received in culture bottles   Culture   Final    NO GROWTH 5 DAYS Performed at [facility], [address], [place], [place] [address]    Report Status 06/29/2018 FINAL  Final  Blood culture (routine x 2)     Status: None   Collection Time: 06/24/18  1:45 AM  Result Value Ref Range Status   Specimen Description BLOOD LEFT ANTECUBITAL  Final   Special Requests   Final    BOTTLES DRAWN AEROBIC AND ANAEROBIC Blood Culture adequate volume   Culture   Final    NO GROWTH 5 DAYS Performed at [facility], [address] [place], [place] [address]    Report Status 06/29/2018 FINAL  Final  Urine Culture     Status: None   Collection Time: 06/24/18  4:52 AM  Result Value Ref Range Status   Specimen Description URINE, CATHETERIZED  Final   Special Requests NONE  Final   Culture   Final    NO GROWTH Performed at [facility], [address], [place], [place] [address]    Report Status 06/25/2018 FINAL  Final  MRSA PCR Screening     Status: None   Collection Time: 06/24/18  6:30 AM  Result Value Ref Range Status   MRSA by PCR NEGATIVE NEGATIVE Final    Comment:        The GeneXpert MRSA Assay (FDA approved for NASAL specimens only), is one component of a comprehensive MRSA colonization surveillance program. It is not intended to diagnose MRSA infection nor to guide or monitor treatment for MRSA infections. Performed at [facility], [address], [place], [place] [address]      Scheduled Meds: . enoxaparin (LOVENOX) injection  30 mg Subcutaneous Q24H  . feeding supplement  1 Container Oral TID BM  . levETIRAcetam  250 mg Oral Q2000  .  levETIRAcetam  500 mg Oral Q24H  . metoprolol tartrate  12.5 mg Oral BID  . polyvinyl alcohol  1 drop Both Eyes BID     LOS: 8 days   Loistine Chance, MD   If 7PM-7AM, please contact night-coverage per Amion 07/02/2018, 8:51 AM

## 2018-07-02 NOTE — Progress Notes (Signed)
NAME:  Isabella [name], MRN:  [id number], DOB:  May 02, 1921, LOS: 8 ADMISSION DATE:  06/23/2018, CONSULTATION DATE:  06/24/2018 REFERRING MD:  Manus Gunning MD, CHIEF COMPLAINT:  Altered mental status   Brief History   84 yr old patient with PMHx of spontaneous tension PTX in February presents to [facility] on 9/25 with Altered mental status, unresponsive at facility, on evaluation found to have a Right sided PTX with no signs of tension. Hemodynamically stable. PCCM consulted for mgmt. CT placed by EDP. Follow up CXR with some mild residual apical pneumothorax.   Significant Hospital Events   Right sided Pneumothorax on presentation  Consults: date of consult/date signed off & final recs:  PCCM  Procedures (surgical and bedside):  Chest Tube (pigtail)- 06/24/18 9/26 > PTX resolved on 20 cmH20 suction, transitioned to water seal 9/27 > Recurrent R PTX on water seal. Placed back to 20 cmH20 06/30/2018 Place chest tube to waterseal 10/2 > chest tube placed back to suction 07/02/2018 chest tube back to waterseal  Significant Diagnostic Tests:    Subjective:  Repeat CXR with 15% PTX.  Chest tube placed back to suction.   Objective   Blood pressure (!) 131/92, pulse 67, temperature 98 F (36.7 C), temperature source Oral, resp. rate 14, weight 45.6 kg, SpO2 99 %.        Intake/Output Summary (Last 24 hours) at 07/02/2018 1217 Last data filed at 07/02/2018 1121 Gross per 24 hour  Intake 591 ml  Output 20 ml  Net 571 ml   Filed Weights   06/29/18 0359 07/01/18 0349 07/02/18 0558  Weight: 44.5 kg 43.5 kg 45.6 kg    Examination: General: Frail elderly female no acute distress HEENT: No lymphadenopathy is appreciated Neuro: Poorly responsive CV: Sounds are distant PULM: even/non-labored, lungs bilaterally diminished right chest tube without air leak currently on 10 cm of suction NW:GNFA, non-tender, bsx4 active  Extremities: warm/dry, negative edema  Skin: no rashes or lesions    Resolved  Hospital Problem list     Assessment & Plan:  84 yr old female with PMHx significant for Vascular Dementia, HTN, Failure to thrive, severe malnutrition, hypernatremia from dehydration, seizures, and CKD stage III  Right sided pneumothorax without tension s/p chest tube placement - pigtail catheter placed 9/25 early AM. Changed to waterseal after improvement 9/26, then 9/27 recurrence of PTX, so placed back to suction 9/28. 10/1 placed to waterseal but unfortunately 10/2 recurrent 15% PTX so once again, placed to suction. We did not place a chest tube to waterseal on 07/02/2018 Repeat chest x-ray on 07/03/2018 Able to give consideration for just discontinuing chest tube and stop doing chest x-ray.  Advanced dementia  DNR Per primary   Rest per primary team.  Disposition / Summary of Today's Plan 07/02/18   Right chest tube on 10 cmH2O 07/02/2018 we will again attempt to put to waterseal     Mobility: bedrest Code Status: DNR No family at bedside 10/2  Labs   CBC: Recent Labs  Lab 06/26/18 0436 06/27/18 0621 07/02/18 0952  WBC 5.1 5.7 5.4  NEUTROABS 3.1  --   --   HGB 12.6 13.3 13.1  HCT 38.2 41.3 41.0  MCV 91.8 93.7 93.0  PLT 247 247 257    Basic Metabolic Panel: Recent Labs  Lab 06/26/18 0436 06/27/18 0621 06/28/18 0644 06/29/18 0427 06/30/18 0328 07/02/18 0952  NA 138 136 138 140 140 136  K 3.1* 3.4* 3.2* 3.1* 4.5 3.9  CL 105 105 103  107 107 101  CO2 25 19* 24 26 23 28   GLUCOSE 81 70 84 97 92 102*  BUN 8 15 12 11 13 9   CREATININE 0.59 0.77 0.64 0.61 0.73 0.72  CALCIUM 8.7* 8.6* 8.9 8.7* 9.1 8.9  MG 1.7 2.2  --  1.7  --   --    GFR: CrCl cannot be calculated (Unknown ideal weight.). Recent Labs  Lab 06/26/18 0436 06/27/18 0621 07/02/18 0952  WBC 5.1 5.7 5.4    Liver Function Tests: No results for input(s): AST, ALT, ALKPHOS, BILITOT, PROT, ALBUMIN in the last 168 hours. No results for input(s): LIPASE, AMYLASE in the last 168 hours. No results  for input(s): AMMONIA in the last 168 hours.  ABG    Component Value Date/Time   TCO2 24 03/15/2016 1118     Coagulation Profile: No results for input(s): INR, PROTIME in the last 168 hours.  Cardiac Enzymes: No results for input(s): CKTOTAL, CKMB, CKMBINDEX, TROPONINI in the last 168 hours.  HbA1C: No results found for: HGBA1C  CBG: No results for input(s): GLUCAP in the last 168 hours.  Admitting History of Present Illness.   84 yr old female with PMHx significant for vascular Dementia, HTN, Failure to thrive, severe malnutrition, hypernatremia from dehydration, seizures, and CKD stage III presents from [facility] with increasing altered mental status, they stated that the pt had slurred speech and seemed different from baseline and was found unresponsive in bed at facility. On arrival to [facility] she was alert but not oriented.  Received workup including a CXR which showed large right sided pneumothorax. PCCM consulted to assist in management.   Brett Canales Minor ACNP [facility] PCCM Pager [phone] till 1 pm If no answer page [phone] 07/02/2018, 12:17 PM

## 2018-07-02 NOTE — Progress Notes (Addendum)
Nutrition Follow-up  DOCUMENTATION CODES:   Severe malnutrition in context of chronic illness  INTERVENTION:    Continue Boost Breeze po TID, each supplement provides 250 kcal and 9 grams of protein  NUTRITION DIAGNOSIS:   Severe Malnutrition related to chronic illness(dementia) as evidenced by severe fat depletion, severe muscle depletion.  Ongoing  GOAL:   Patient will meet greater than or equal to 90% of their needs  Unmet  MONITOR:   PO intake, Supplement acceptance, Weight trends, Labs  ASSESSMENT:   84 year old female with medical history of severe dementia, seizure disorder, HTN, history of recurrent spontaneous pneumothorax was sent from SNF to the ED due to AMS and lethargy.  Upon ED evaluation patient was found to have decreased breath sounds on the right side, hemodynamically stable and chest x-ray showed large right-sided pneumothorax.  Head CT was negative for acute finding.  Patient was admitted with working diagnosis of recurrent spontaneous pneumothorax.  Per CXR today, pneumothorax has resolved.   Patient unable to provide any nutrition hx. Per discussion with nurse and nurse tech, patient has been eating fair. She likes the [employer] supplements, drinks three per day. Requires assistance with meals.   Labs and medications reviewed.   NUTRITION - FOCUSED PHYSICAL EXAM:    Most Recent Value  Orbital Region  Severe depletion  Upper Arm Region  Severe depletion  Thoracic and Lumbar Region  Moderate depletion  Buccal Region  Severe depletion  Temple Region  Severe depletion  Clavicle Bone Region  Severe depletion  Clavicle and Acromion Bone Region  Moderate depletion  Scapular Bone Region  Moderate depletion  Dorsal Hand  Mild depletion  Patellar Region  Moderate depletion  Anterior Thigh Region  Moderate depletion  Posterior Calf Region  Moderate depletion  Edema (RD Assessment)  None  Hair  Reviewed  Eyes  Reviewed  Mouth  Reviewed  Skin   Reviewed  Nails  Reviewed       Diet Order:   Diet Order            Diet regular Room service appropriate? Yes; Fluid consistency: Thin  Diet effective now              EDUCATION NEEDS:   Not appropriate for education at this time  Skin:  Skin Assessment: Reviewed RN Assessment  Last BM:  10/1  Height:   Ht Readings from Last 1 Encounters:  07/02/18 5' (1.524 m)    Weight:   Wt Readings from Last 1 Encounters:  07/02/18 45.6 kg    Ideal Body Weight:  45.5 kg  BMI:  Body mass index is 19.63 kg/m.  Estimated Nutritional Needs:   Kcal:  1610-9604  Protein:  60-70 gm  Fluid:  >/= 1.5 L/day    Joaquin Courts, RD, LDN, CNSC Pager [phone] After Hours Pager [phone]

## 2018-07-03 ENCOUNTER — Inpatient Hospital Stay (HOSPITAL_COMMUNITY): Payer: Medicare Other

## 2018-07-03 DIAGNOSIS — E43 Unspecified severe protein-calorie malnutrition: Secondary | ICD-10-CM

## 2018-07-03 MED ORDER — LEVETIRACETAM 100 MG/ML PO SOLN
500.0000 mg | ORAL | Status: DC
  Administered 2018-07-03 – 2018-07-07 (×5): 500 mg via ORAL
  Filled 2018-07-03 (×5): qty 5

## 2018-07-03 MED ORDER — LEVETIRACETAM 100 MG/ML PO SOLN
250.0000 mg | ORAL | Status: DC
  Administered 2018-07-03 – 2018-07-07 (×5): 250 mg via ORAL
  Filled 2018-07-03 (×5): qty 2.5

## 2018-07-03 NOTE — Progress Notes (Signed)
    NAME:  Isabella [name], MRN:  [id number], DOB:  Nov 17, 1920, LOS: 9 ADMISSION DATE:  06/23/2018, CONSULTATION DATE:  06/24/2018 REFERRING MD:  Manus Gunning MD, CHIEF COMPLAINT:  Altered mental status   Brief History   84 yo female with recurrent spontaneous Rt PTX (previous episode in February 2019).  Chest tube placed by EDP.  PCCM consulted to manage chest tube.  Past Medical History  Vascular dementia, seizures, HTN, Hearing loss  Significant Hospital Events   Right sided Pneumothorax on presentation  Consults: date of consult/date signed off & final recs:    Procedures (surgical and bedside):  Chest Tube (pigtail)- 9/25 >>  9/26 PTX resolved on 20 cmH20 suction, transitioned to water seal 9/27 Recurrent R PTX on water seal. Placed back to 20 cmH20 10/1 Place chest tube to waterseal 10/2 chest tube placed back to suction 10/3 chest tube back to waterseal 10/4 increased PTX  Significant Diagnostic Tests:    Subjective:  Unable to provide hx.  Objective   Blood pressure 111/76, pulse 69, temperature (!) 97.4 F (36.3 C), temperature source Axillary, resp. rate 18, height 5' (1.524 m), weight 45.9 kg, SpO2 97 %.        Intake/Output Summary (Last 24 hours) at 07/03/2018 1500 Last data filed at 07/03/2018 1300 Gross per 24 hour  Intake 351 ml  Output -  Net 351 ml   Filed Weights   07/01/18 0349 07/02/18 0558 07/03/18 0431  Weight: 43.5 kg 45.6 kg 45.9 kg    Examination:  General - frail Eyes - pupils reactive ENT - no sinus tenderness, no stridor Cardiac - regular rate/rhythm, no murmur Chest - decreased BS on Rt Abdomen - soft, non tender, + bowel sounds Extremities - no cyanosis, clubbing, or edema Skin - no rashes Neuro - doesn't follow commands  CXR 10/4 (reviewed by me) >> increased size of Rt PTX  Assessment & Plan:   Recurrent Rt PTX. - will place back on -10 cm H2O suction and leave on suction for next several days - f/u CXR - if PTX continues to recur  when transitioning to water seal, then might need consider trying pleurodesis - she would not be a candidate for surgical intervention given her advance age and comorbid conditions  Goals of care. - DNR  Disposition / Summary of Today's Plan 07/03/18   Place chest tube back on -10 suction.   Labs   CBC: Recent Labs  Lab 06/27/18 0621 07/02/18 0952  WBC 5.7 5.4  HGB 13.3 13.1  HCT 41.3 41.0  MCV 93.7 93.0  PLT 247 257    Basic Metabolic Panel: Recent Labs  Lab 06/27/18 0621 06/28/18 0644 06/29/18 0427 06/30/18 0328 07/02/18 0952  NA 136 138 140 140 136  K 3.4* 3.2* 3.1* 4.5 3.9  CL 105 103 107 107 101  CO2 19* 24 26 23 28   GLUCOSE 70 84 97 92 102*  BUN 15 12 11 13 9   CREATININE 0.77 0.64 0.61 0.73 0.72  CALCIUM 8.6* 8.9 8.7* 9.1 8.9  MG 2.2  --  1.7  --   --     Recent Labs  Lab 06/27/18 0621 07/02/18 0952  WBC 5.7 5.4    Coralyn Helling, MD [facility] Pulmonary/Critical Care 07/03/2018, 3:04 PM

## 2018-07-03 NOTE — Progress Notes (Signed)
Patient's pigtail catheter was noted to be out around 10:30 pm. Not sure how long it has been out. Patient is demented and she is unable explain what happened. On call Dr. Darrick Penna made aware; STAT chest xray ordered. VS WNL.  Will continue to monitor.

## 2018-07-03 NOTE — Evaluation (Signed)
Occupational Therapy Evaluation Patient Details Name: Isabella [name] MRN: [id number] DOB: 23-Jan-1921 Today's Date: 07/03/2018    History of Present Illness 84 yr old patient with PMHx of spontaneous tension PTX in February presents to [facility] on 9/25 with Altered mental status, unresponsive at facility, on evaluation found to have a Right sided PTX with no signs of tension.  has a past medical history of Dementia, Hearing loss, Hypertension, Seizures (HCC), Vascular dementia, and Weight loss.   Clinical Impression   Pt admitted with above. She demonstrates the below listed deficits and will benefit from continued OT to maximize safety and independence with BADLs.  Pt presents to OT with generalized weakness, decreased activity tolerance, impaired balance, and cognitive deficits (h/o dementia).  She currently requires max - total A for ADLs and mod A +2 for functional mobility.  PTA, she lived at [facility], and required assist for ADLs.  Anticipate she will require SNF level rehab at discharge, however, if ALF able to provide current level of care, return to ALF would be optimal as pt is familiar with facility, staff, and residents.  Will follow acutely.       Follow Up Recommendations  SNF;Supervision/Assistance - 24 hour (if ALF able to provide current level of assist, return to ALF would be optimal as pt would be familiar with staff, facility, and residents)   Equipment Recommendations  None recommended by OT    Recommendations for Other Services       Precautions / Restrictions Restrictions Weight Bearing Restrictions: No      Mobility Bed Mobility Overal bed mobility: Needs Assistance Bed Mobility: Supine to Sit     Supine to sit: Mod assist     General bed mobility comments: tactile cues to initiate movements, assist to move LEs off bed and to lift trunk   Transfers Overall transfer level: Needs assistance Equipment used: 2 person hand held assist Transfers: Sit  to/from Stand;Stand Pivot Transfers Sit to Stand: Mod assist;+2 physical assistance;+2 safety/equipment Stand pivot transfers: Mod assist;+2 physical assistance;+2 safety/equipment       General transfer comment: Pt requires assist to move into standing, assist to guide movement.  She tends to lean posteriorly.  As she fatigues, level of asisst increases     Balance Overall balance assessment: Needs assistance Sitting-balance support: Feet supported Sitting balance-Leahy Scale: Poor Sitting balance - Comments: requires min A for static sitting    Standing balance support: Single extremity supported Standing balance-Leahy Scale: Poor Standing balance comment: requires up to mod A and UE support                            ADL either performed or assessed with clinical judgement   ADL Overall ADL's : Needs assistance/impaired Eating/Feeding: Sitting;Maximal assistance   Grooming: Wash/dry hands;Wash/dry face;Total assistance;Standing Grooming Details (indicate cue type and reason): Performed hand over hand assist to initiate washing face.  She will assist with washing her mouth and chin area, but only assists minmally  Upper Body Bathing: Total assistance;Sitting   Lower Body Bathing: Total assistance;Sit to/from stand   Upper Body Dressing : Total assistance;Sitting   Lower Body Dressing: Total assistance;Sit to/from stand   Toilet Transfer: Moderate assistance;+2 for physical assistance;+2 for safety/equipment;Ambulation Toilet Transfer Details (indicate cue type and reason): Assist for baalcne and to guide movement  Toileting- Clothing Manipulation and Hygiene: Total assistance;Sit to/from stand       Functional mobility during ADLs: Moderate  assistance;+2 for physical assistance;+2 for safety/equipment       Vision   Additional Comments: Pt unable to provide info and unable to particpate in visual assessment      Perception     Praxis Praxis Praxis  tested?: Deficits Deficits: Initiation    Pertinent Vitals/Pain Pain Assessment: Faces Faces Pain Scale: Hurts little more Pain Location: Lt chest  Pain Descriptors / Indicators: Grimacing;Guarding Pain Intervention(s): Monitored during session;Repositioned;Limited activity within patient's tolerance     Hand Dominance (Pt with limited verbalizations )   Extremity/Trunk Assessment Upper Extremity Assessment Upper Extremity Assessment: Generalized weakness   Lower Extremity Assessment Lower Extremity Assessment: Defer to PT evaluation   Cervical / Trunk Assessment Cervical / Trunk Assessment: Kyphotic   Communication Communication Communication: Receptive difficulties;Expressive difficulties   Cognition Arousal/Alertness: Awake/alert Behavior During Therapy: WFL for tasks assessed/performed Overall Cognitive Status: No family/caregiver present to determine baseline cognitive functioning                                 General Comments: Pt with h/o severe to moderate dementia.  Pt with very limited verbalization.  She doesn't follow any verbal commands, but will follow commands with gestures and tactile cues to initiate and guide her     General Comments  VSS     Exercises     Shoulder Instructions      Home Living Family/patient expects to be discharged to:: Assisted living                                 Additional Comments: Pt resided at [facility].  Per chart review, pt went to SNF for rehab then back to [facility]       Prior Functioning/Environment Level of Independence: Needs assistance  Gait / Transfers Assistance Needed: hx dementia, poor historian, from ALF per chart review ADL's / Homemaking Assistance Needed: Per chart review, pt required assist for ADLs             OT Problem List: Decreased strength;Decreased activity tolerance;Impaired balance (sitting and/or standing);Decreased cognition;Decreased safety  awareness;Decreased knowledge of use of DME or AE;Pain      OT Treatment/Interventions: Self-care/ADL training;Energy conservation;DME and/or AE instruction;Therapeutic activities;Cognitive remediation/compensation;Patient/family education;Balance training    OT Goals(Current goals can be found in the care plan section) Acute Rehab OT Goals OT Goal Formulation: Patient unable to participate in goal setting Time For Goal Achievement: 07/17/18 Potential to Achieve Goals: Good ADL Goals Pt Will Perform Eating: with mod assist;sitting;bed level Pt Will Perform Grooming: standing;with max assist Pt Will Transfer to Toilet: with min assist;ambulating;regular height toilet Pt Will Perform Toileting - Clothing Manipulation and hygiene: with max assist;sit to/from stand  OT Frequency: Min 2X/week   Barriers to D/C: Decreased caregiver support          Co-evaluation PT/OT/SLP Co-Evaluation/Treatment: Yes Reason for Co-Treatment: Necessary to address cognition/behavior during functional activity;For patient/therapist safety;To address functional/ADL transfers   OT goals addressed during session: ADL's and self-care      AM-PAC PT "6 Clicks" Daily Activity     Outcome Measure Help from another person eating meals?: A Lot Help from another person taking care of personal grooming?: Total Help from another person toileting, which includes using toliet, bedpan, or urinal?: A Lot Help from another person bathing (including washing, rinsing, drying)?: Total Help from another person to put  on and taking off regular upper body clothing?: Total Help from another person to put on and taking off regular lower body clothing?: Total 6 Click Score: 8   End of Session Nurse Communication: Mobility status  Activity Tolerance: Patient limited by fatigue Patient left: in chair;with call bell/phone within reach;with chair alarm set  OT Visit Diagnosis: Unsteadiness on feet (R26.81);Cognitive  communication deficit (R41.841)                Time: 1610-9604 OT Time Calculation (min): 18 min Charges:  OT General Charges $OT Visit: 1 Visit OT Evaluation $OT Eval Moderate Complexity: 1 Mod  Jeani Hawking, OTR/L Acute Rehabilitation Services Pager [phone] Office [phone]   Jeani Hawking M 07/03/2018, 11:48 AM

## 2018-07-03 NOTE — Evaluation (Signed)
Physical Therapy Evaluation Patient Details Name: Isabella [name] MRN: [id number] DOB: 11/02/1920 Today's Date: 07/03/2018   History of Present Illness  84 yr old patient with PMHx of spontaneous tension PTX in February presents to [facility] on 9/25 with Altered mental status, unresponsive at facility, on evaluation found to have a Right sided PTX with no signs of tension.  has a past medical history of Dementia, Hearing loss, Hypertension, Seizures (HCC), Vascular dementia, and Weight loss.  Clinical Impression   Pt admitted with above diagnosis. Pt currently with functional limitations due to the deficits listed below (see PT Problem List). From [facility]F, and per chart review, had previous episode of going to SNF for rehab and successfully rehabbed back to her ALF; presents with decr activity tolerance, weakness, gait and balance dysfunction; Currently appropriate for SNF,  however, if ALF able to provide current level of care, return to ALF would be optimal as pt is familiar with facility, staff, and residents; Pt will benefit from skilled PT to increase their independence and safety with mobility to allow discharge to the venue listed below.       Follow Up Recommendations SNF SNF level rehab at discharge, however, if ALF able to provide current level of care, return to ALF would be optimal as pt is familiar with facility, staff, and residents.    Equipment Recommendations  Rolling walker with 5" wheels(Will consider RW)    Recommendations for Other Services       Precautions / Restrictions Precautions Precautions: Fall Precaution Comments: Chest tube      Mobility  Bed Mobility Overal bed mobility: Needs Assistance Bed Mobility: Supine to Sit     Supine to sit: Mod assist     General bed mobility comments: tactile cues to initiate movements, assist to move LEs off bed and to lift trunk   Transfers Overall transfer level: Needs assistance Equipment used: 2 person hand held  assist Transfers: Sit to/from Stand;Stand Pivot Transfers Sit to Stand: Mod assist;+2 physical assistance;+2 safety/equipment Stand pivot transfers: Mod assist;+2 physical assistance;+2 safety/equipment       General transfer comment: Pt requires assist to move into standing, assist to guide movement.  She tends to lean posteriorly.  As she fatigues, level of asisst increases   Ambulation/Gait Ambulation/Gait assistance: Mod assist;+2 safety/equipment(second person to manage chest tube and Pleaurevac) Gait Distance (Feet): 30 Feet Assistive device: 1 person hand held assist;2 person hand held assist Gait Pattern/deviations: Step-through pattern;Decreased step length - right;Decreased step length - left;Drifts right/left;Scissoring;Narrow base of support Gait velocity: slow   General Gait Details: bilateral handheld assist to lead pt and steady; min/mod assist for balance; facilitation for weight shift, steps spontaneuously with weight shifting  Stairs            Wheelchair Mobility    Modified Rankin (Stroke Patients Only)       Balance Overall balance assessment: Needs assistance Sitting-balance support: Feet supported Sitting balance-Leahy Scale: Poor Sitting balance - Comments: requires min A for static sitting    Standing balance support: Single extremity supported Standing balance-Leahy Scale: Poor Standing balance comment: requires up to mod A and UE support                              Pertinent Vitals/Pain Pain Assessment: Faces Faces Pain Scale: Hurts little more Pain Location: Lt chest  Pain Descriptors / Indicators: Grimacing;Guarding Pain Intervention(s): Monitored during session;Repositioned    Home  Living Family/patient expects to be discharged to:: Assisted living                 Additional Comments: Pt resided at [facility].  Per chart review, pt went to SNF for rehab then back to [facility]     Prior Function Level of  Independence: Needs assistance   Gait / Transfers Assistance Needed: hx dementia, poor historian, from ALF per chart review  ADL's / Homemaking Assistance Needed: Per chart review, pt required assist for ADLs         Hand Dominance   Dominant Hand: (Pt with limited verbalizations )    Extremity/Trunk Assessment   Upper Extremity Assessment Upper Extremity Assessment: Defer to OT evaluation    Lower Extremity Assessment Lower Extremity Assessment: Generalized weakness    Cervical / Trunk Assessment Cervical / Trunk Assessment: Kyphotic  Communication   Communication: Receptive difficulties;Expressive difficulties  Cognition Arousal/Alertness: Awake/alert Behavior During Therapy: WFL for tasks assessed/performed Overall Cognitive Status: No family/caregiver present to determine baseline cognitive functioning                                 General Comments: Pt with h/o severe to moderate dementia.  Pt with very limited verbalization.  She doesn't follow any verbal commands, but will follow commands with gestures and tactile cues to initiate and guide her        General Comments General comments (skin integrity, edema, etc.): VSS    Exercises     Assessment/Plan    PT Assessment Patient needs continued PT services  PT Problem List Decreased strength;Decreased range of motion;Decreased activity tolerance;Decreased balance;Decreased mobility;Decreased coordination;Decreased cognition;Decreased knowledge of use of DME;Decreased safety awareness;Decreased knowledge of precautions;Cardiopulmonary status limiting activity       PT Treatment Interventions DME instruction;Gait training;Functional mobility training;Therapeutic activities;Therapeutic exercise;Balance training;Neuromuscular re-education;Cognitive remediation;Patient/family education    PT Goals (Current goals can be found in the Care Plan section)  Acute Rehab PT Goals Patient Stated Goal: Unable  to state PT Goal Formulation: Patient unable to participate in goal setting Time For Goal Achievement: 07/17/18 Potential to Achieve Goals: Fair    Frequency Min 2X/week   Barriers to discharge        Co-evaluation PT/OT/SLP Co-Evaluation/Treatment: Yes Reason for Co-Treatment: Necessary to address cognition/behavior during functional activity PT goals addressed during session: Mobility/safety with mobility OT goals addressed during session: ADL's and self-care       AM-PAC PT "6 Clicks" Daily Activity  Outcome Measure Difficulty turning over in bed (including adjusting bedclothes, sheets and blankets)?: A Lot Difficulty moving from lying on back to sitting on the side of the bed? : A Lot Difficulty sitting down on and standing up from a chair with arms (e.g., wheelchair, bedside commode, etc,.)?: Unable Help needed moving to and from a bed to chair (including a wheelchair)?: A Lot Help needed walking in hospital room?: A Lot Help needed climbing 3-5 steps with a railing? : Total 6 Click Score: 10    End of Session   Activity Tolerance: Patient tolerated treatment well Patient left: in chair;with call bell/phone within reach;with chair alarm set Nurse Communication: Mobility status PT Visit Diagnosis: Unsteadiness on feet (R26.81);Other abnormalities of gait and mobility (R26.89);Muscle weakness (generalized) (M62.81)    Time: 8295-6213 PT Time Calculation (min) (ACUTE ONLY): 17 min   Charges:   PT Evaluation $PT Eval Moderate Complexity: 1 Mod  Van Clines, [place]  Acute Rehabilitation Services Pager [phone] Office [phone]   Levi Aland 07/03/2018, 1:19 PM

## 2018-07-03 NOTE — Progress Notes (Signed)
Isabella [name]  ZOX:[id number] DOB: 27-Sep-1921 DOA: 06/23/2018 PCP: Mortimer Fries, PA    Brief Narrative:   84 year old female with a history of severe dementia, seizure disorder, hypertension, and recurrent spontaneous pneumothorax was transferred to the hospital from SNF with altered mental status and lethargy.  In the ED, patient was found to have decreased breath sounds on the right, and a chest x-ray showed a large right-sided pneumothorax. Head CT was negative for acute findings.  Patient was the admitted with recurrent spontaneous pneumothorax and was put on pigtail catheter/chest tube.   Subjective: She is alert awake but noncommunicative.  Has advanced dementia.  No interval complaints reported by the nursing staff.  Assessment & Plan:  Recurrent spontaneous pneumothorax.   Patient does have a history of recurrent pneumothorax in the past and was admitted in February 2019 for tension pneumothorax.   PCCM on board, status post right-sided pigtail catheter on 06/24/2018.  Chest tube has been put on suction at this time.  X-ray of the chest on 07/02/2018 showed a resolution of pneumothorax but a repeat chest x-ray from 07/03/2018 shows moderate pneumothorax..  We will follow with PCCM recommendations on chest tube management.  Acute metabolic encephalopathy in setting of severe dementia This is likely her baseline.  She does have advanced dementia.  Hypernatremia  Resolved with IV fluids.  Likely from dehydration.  Hypokalemia Resolved  Hypomagnesemia. Corrected   Acute kidney injury  Secondary to volume depletion and dehydration.  Improved after IV fluids.  History of seizure disorder. On Keppra.  Stable  Severe protein calorie malnutrition on admission.  Continue nutritional support  HTN  We will closely monitor blood pressure.  On metoprolol.  Blood pressure is relatively controlled  DVT prophylaxis: Enoxaparin  Code Status: DNR - NO CODE  Family Communication:  Spoke with the patient's daughter on the phone yesterday and updated her about the clinical condition of the patient.  Disposition Plan: Physical therapy has recommended skilled nursing facility.  Patient was previously in assisted living level of care.  We will consult Occupational Therapy.  Patient is still on chest tube with moderate pneumothorax.  Consultants:  PCCM  Antimicrobials:  none  Objective: Blood pressure 139/83, pulse 80, temperature 97.6 F (36.4 C), temperature source Oral, resp. rate 14, height 5' (1.524 m), weight 45.9 kg, SpO2 96 %.  Intake/Output Summary (Last 24 hours) at 07/03/2018 1012 Last data filed at 07/02/2018 1921 Gross per 24 hour  Intake 342 ml  Output -  Net 342 ml   Filed Weights   07/01/18 0349 07/02/18 0558 07/03/18 0431  Weight: 43.5 kg 45.6 kg 45.9 kg    Physical Examination: General exam: Appears calm and comfortable ,Not in distress.  Cachectic HEENT:PERRL,Oral mucosa moist Respiratory system: Bilateral diminished breath sounds.  Right-sided chest tube in place Cardiovascular system: S1 & S2 heard, RRR.  Gastrointestinal system: Abdomen is nondistended, soft and nontender. No organomegaly or masses felt. Normal bowel sounds heard. Central nervous system: Alert awake has advanced dementia.  Moves all extremities. Extremities: No edema, no clubbing ,no cyanosis, distal peripheral pulses palpable. Skin: No rashes, lesions or ulcers,no icterus ,no pallor MSK: Thinly built  CBC: Recent Labs  Lab 06/27/18 0621 07/02/18 0952  WBC 5.7 5.4  HGB 13.3 13.1  HCT 41.3 41.0  MCV 93.7 93.0  PLT 247 257   Basic Metabolic Panel: Recent Labs  Lab 06/27/18 0621  06/29/18 0427 06/30/18 0328 07/02/18 0952  NA 136   < > 140 140  136  K 3.4*   < > 3.1* 4.5 3.9  CL 105   < > 107 107 101  CO2 19*   < > 26 23 28   GLUCOSE 70   < > 97 92 102*  BUN 15   < > 11 13 9   CREATININE 0.77   < > 0.61 0.73 0.72  CALCIUM 8.6*   < > 8.7* 9.1 8.9  MG 2.2  --   1.7  --   --    < > = values in this interval not displayed.   GFR: Estimated Creatinine Clearance: 30.2 mL/min (by C-G formula based on SCr of 0.72 mg/dL).  Liver Function Tests: No results for input(s): AST, ALT, ALKPHOS, BILITOT, PROT, ALBUMIN in the last 168 hours. No results for input(s): AMMONIA in the last 168 hours. Cardiac Enzymes: No results for input(s): CKTOTAL, CKMB, CKMBINDEX, TROPONINI in the last 168 hours. CBG: No results for input(s): GLUCAP in the last 168 hours.  Recent Results (from the past 240 hour(s))  Blood culture (routine x 2)     Status: None   Collection Time: 06/24/18  1:30 AM  Result Value Ref Range Status   Specimen Description BLOOD RIGHT FOREARM  Final   Special Requests   Final    BOTTLES DRAWN AEROBIC AND ANAEROBIC Blood Culture results may not be optimal due to an inadequate volume of blood received in culture bottles   Culture   Final    NO GROWTH 5 DAYS Performed at [facility], [address] [place], [place] [address]    Report Status 06/29/2018 FINAL  Final  Blood culture (routine x 2)     Status: None   Collection Time: 06/24/18  1:45 AM  Result Value Ref Range Status   Specimen Description BLOOD LEFT ANTECUBITAL  Final   Special Requests   Final    BOTTLES DRAWN AEROBIC AND ANAEROBIC Blood Culture adequate volume   Culture   Final    NO GROWTH 5 DAYS Performed at [facility], [address] [place], [place] [address]    Report Status 06/29/2018 FINAL  Final  Urine Culture     Status: None   Collection Time: 06/24/18  4:52 AM  Result Value Ref Range Status   Specimen Description URINE, CATHETERIZED  Final   Special Requests NONE  Final   Culture   Final    NO GROWTH Performed at [facility], [address], [place], [place] [address]    Report Status 06/25/2018 FINAL  Final  MRSA PCR Screening     Status: None   Collection Time: 06/24/18  6:30 AM  Result Value Ref Range Status   MRSA by PCR NEGATIVE  NEGATIVE Final    Comment:        The GeneXpert MRSA Assay (FDA approved for NASAL specimens only), is one component of a comprehensive MRSA colonization surveillance program. It is not intended to diagnose MRSA infection nor to guide or monitor treatment for MRSA infections. Performed at [facility], [address], [place], [place] [address]      Scheduled Meds: . enoxaparin (LOVENOX) injection  30 mg Subcutaneous Q24H  . feeding supplement  1 Container Oral TID BM  . levETIRAcetam  250 mg Oral Q24H  . levETIRAcetam  500 mg Oral Q24H  . metoprolol tartrate  12.5 mg Oral BID  . multivitamin with minerals  1 tablet Oral Daily  . polyvinyl alcohol  1  drop Both Eyes BID     LOS: 9 days   Loistine Chance, MD Pager: [phone]  If 7PM-7AM, please contact night-coverage per Amion 07/03/2018, 10:12 AM

## 2018-07-03 NOTE — Progress Notes (Signed)
CSW spoke to patient's daughter, Meriam Sprague, on the phone, regarding SNF recommendation. Daughter agreeable to SNF, and stated patient has been at [place] in the past. CSW sent out initial referrals, awaiting bed offers. CSW will provide bed offers when available and support with discharge planning.  Abigail Butts, LCSWA [phone]

## 2018-07-03 NOTE — NC FL2 (Signed)
[place] MEDICAID FL2 LEVEL OF CARE SCREENING TOOL     IDENTIFICATION  Patient Name: Isabella [name] Birthdate: 22-Aug-1921 Sex: female Admission Date (Current Location): 06/23/2018  [facility] and [place] Number:  Producer, television/film/video and Address:  [facility], [address], [place], [place] [address]      Provider Number: [id number]  Attending Physician Name and Address:  Joycelyn Das, MD  Relative Name and Phone Number:       Current Level of Care: Hospital Recommended Level of Care: Skilled Nursing Facility Prior Approval Number:    Date Approved/Denied:   PASRR Number: [id number]  Discharge Plan: SNF    Current Diagnoses: Patient Active Problem List   Diagnosis Date Noted  . Protein-calorie malnutrition, severe 07/02/2018  . Acute metabolic encephalopathy 06/24/2018  . Recurrent spontaneous pneumothorax   . Essential hypertension, benign 11/10/2017  . Vascular dementia without behavioral disturbance (HCC) 11/10/2017  . Slow transit constipation 11/10/2017  . Hypokalemia 11/10/2017  . Hypomagnesemia 11/10/2017  . Chronic generalized pain disorder 11/10/2017  . Psychosis in elderly (HCC) 11/10/2017  . Encounter for chest tube placement   . Acute respiratory failure (HCC)   . Pneumothorax on right 11/02/2017  . Hypernatremia 11/02/2017  . CAP (community acquired pneumonia) 09/29/2016  . Altered mental status 09/27/2016  . Seizures (HCC) 09/27/2016    Orientation RESPIRATION BLADDER Height & Weight     Self  Normal Incontinent Weight: 101 lb 3.1 oz (45.9 kg) Height:  5' (152.4 cm)  BEHAVIORAL SYMPTOMS/MOOD NEUROLOGICAL BOWEL NUTRITION STATUS      Incontinent Diet(please see DC summary)  AMBULATORY STATUS COMMUNICATION OF NEEDS Skin   Extensive Assist Verbally Normal                       Personal Care Assistance Level of Assistance  Bathing, Feeding, Dressing Bathing Assistance: Limited assistance Feeding assistance:  Limited assistance Dressing Assistance: Limited assistance     Functional Limitations Info  Sight, Hearing, Speech Sight Info: Adequate Hearing Info: Adequate Speech Info: Adequate    SPECIAL CARE FACTORS FREQUENCY  PT (By licensed PT), OT (By licensed OT)     PT Frequency: 5x/week OT Frequency: 5x/week            Contractures Contractures Info: Not present    Additional Factors Info  Code Status, Allergies Code Status Info: DNR Allergies Info: No Known Allergies           Current Medications (07/03/2018):  This is the current hospital active medication list Current Facility-Administered Medications  Medication Dose Route Frequency Provider Last Rate Last Dose  . acetaminophen (TYLENOL) suppository 650 mg  650 mg Rectal Q4H PRN Lonia Blood, MD      . acetaminophen (TYLENOL) tablet 650 mg  650 mg Oral Q4H PRN Lonia Blood, MD      . enoxaparin (LOVENOX) injection 30 mg  30 mg Subcutaneous Q24H Dhungel, Nishant, MD   30 mg at 07/03/18 0914  . feeding supplement (BOOST / RESOURCE BREEZE) liquid 1 Container  1 Container Oral TID BM Lonia Blood, MD   1 Container at 07/03/18 1314  . levETIRAcetam (KEPPRA) 100 MG/ML solution 250 mg  250 mg Oral Q24H Pokhrel, Laxman, MD      . levETIRAcetam (KEPPRA) 100 MG/ML solution 500 mg  500 mg Oral Q24H Pokhrel, Laxman, MD   500 mg at 07/03/18 1314  . LORazepam (ATIVAN) injection 0.25 mg  0.25 mg Intravenous Q6H  PRN Briscoe Deutscher, MD   0.25 mg at 06/27/18 1614  . metoprolol tartrate (LOPRESSOR) tablet 12.5 mg  12.5 mg Oral BID Lonia Blood, MD   12.5 mg at 07/03/18 0914  . morphine 2 MG/ML injection 0.5-1 mg  0.5-1 mg Intravenous Q4H PRN Lonia Blood, MD      . multivitamin with minerals tablet 1 tablet  1 tablet Oral Daily Pokhrel, Laxman, MD   1 tablet at 07/03/18 0914  . polyvinyl alcohol (LIQUIFILM TEARS) 1.4 % ophthalmic solution 1 drop  1 drop Both Eyes BID Dhungel, Nishant, MD   1 drop at 07/03/18  2993     Discharge Medications: Please see discharge summary for a list of discharge medications.  Relevant Imaging Results:  Relevant Lab Results:   Additional Information SSN: [id number]  Abigail Butts, LCSW

## 2018-07-04 ENCOUNTER — Inpatient Hospital Stay (HOSPITAL_COMMUNITY): Payer: Medicare Other

## 2018-07-04 NOTE — Procedures (Signed)
Chest Tube Insertion Procedure Note  Indications:  Clinically significant Pneumothorax  Pre-operative Diagnosis: Pneumothorax  Post-operative Diagnosis: Pneumothorax  Procedure Details  Informed consent was obtained for the procedure, including sedation.  Risks of lung perforation, hemorrhage, arrhythmia, and adverse drug reaction were discussed.   After sterile skin prep, using standard technique, a 14 French tube was placed in the right lateral 3rd rib space.  Findings: 60 ml of serosanguinous fluid obtained  Estimated Blood Loss:  Minimal         Specimens:  None              Complications:  None; patient tolerated the procedure well.         Disposition: hemodynamically stable on SDU         Condition: stable  Attending Attestation: I performed the procedure.  Signed Dr Newell Coral [facility]

## 2018-07-04 NOTE — Progress Notes (Signed)
PROGRESS NOTE  Ellory Khurana ZOX:[id number] DOB: 02-Dec-1920 DOA: 06/23/2018 PCP: Mortimer Fries, PA  HPI/Recap of past 24 hours:  She pulled out her chest tube last night ,critical care reinserted around midnight  Demented elderly female , open eyes but nonverbal, does not follow commands  Assessment/Plan: Principal Problem:   Recurrent spontaneous pneumothorax Active Problems:   Pneumothorax on right   Hypernatremia   Encounter for chest tube placement   Essential hypertension, benign   Acute metabolic encephalopathy   Protein-calorie malnutrition, severe  Recurrent Spontaneous  pneumothorax -s/p chest tube placement, will follow critical care input  Metabolic encephalopathy: Pre snf patient "speaks normally at baseline" She is nonverbal today Blood culture/urine culture negative, no fever Ct head no acute findings  hypokalemia: k 2.8 on presentation, replaced, normalized  Repeat k and check mag home meds lasix held  AKI: resolved  H/o seizure:continue home meds. keppra  Dementia/FTT/severe malnutrition:  -home meds risperidone, remeron , prn ativan held due to lethargy -no feeding tube per previous discussion    Code Status: DNR  Family Communication: patient   Disposition Plan: return to snf once cleared by critical care   Consultants:  Critical care  Procedures:  Chest tube placement  Antibiotics:  none   Objective: BP (!) 141/86 (BP Location: Right Arm)   Pulse 67   Temp (!) 97.5 F (36.4 C) (Axillary)   Resp 17   Ht 5' (1.524 m)   Wt 43.9 kg   SpO2 94%   BMI 18.90 kg/m   Intake/Output Summary (Last 24 hours) at 07/04/2018 0852 Last data filed at 07/04/2018 0651 Gross per 24 hour  Intake 600 ml  Output -  Net 600 ml   Filed Weights   07/02/18 0558 07/03/18 0431 07/04/18 0612  Weight: 45.6 kg 45.9 kg 43.9 kg    Exam: Patient is examined daily including today on 07/04/2018, exams remain the same as of yesterday except that  has changed    General:  Frail elderly, demented, open eyes, nonverbal, does not follow commands, chest tube on right   Cardiovascular: RRR  Respiratory: CTABL  Abdomen: Soft/ND/NT, positive BS  Musculoskeletal: No Edema  Neuro: alert, open eyes, non verbal, does not follow commands  Data Reviewed: Basic Metabolic Panel: Recent Labs  Lab 06/28/18 0644 06/29/18 0427 06/30/18 0328 07/02/18 0952  NA 138 140 140 136  K 3.2* 3.1* 4.5 3.9  CL 103 107 107 101  CO2 24 26 23 28   GLUCOSE 84 97 92 102*  BUN 12 11 13 9   CREATININE 0.64 0.61 0.73 0.72  CALCIUM 8.9 8.7* 9.1 8.9  MG  --  1.7  --   --    Liver Function Tests: No results for input(s): AST, ALT, ALKPHOS, BILITOT, PROT, ALBUMIN in the last 168 hours. No results for input(s): LIPASE, AMYLASE in the last 168 hours. No results for input(s): AMMONIA in the last 168 hours. CBC: Recent Labs  Lab 07/02/18 0952  WBC 5.4  HGB 13.1  HCT 41.0  MCV 93.0  PLT 257   Cardiac Enzymes:   No results for input(s): CKTOTAL, CKMB, CKMBINDEX, TROPONINI in the last 168 hours. BNP (last 3 results) No results for input(s): BNP in the last 8760 hours.  ProBNP (last 3 results) No results for input(s): PROBNP in the last 8760 hours.  CBG: No results for input(s): GLUCAP in the last 168 hours.  No results found for this or any previous visit (from the past 240 hour(s)).  Studies: Dg Chest Port 1 View  Result Date: 07/04/2018 CLINICAL DATA:  Chest tube placement EXAM: PORTABLE CHEST 1 VIEW COMPARISON:  July 03, 2018 FINDINGS: The right pneumothorax has resolved after chest tube placement. Atelectasis is seen adjacent to the chest tube. Prominence of the right hilum persists, possibly due to positioning. IMPRESSION: Right pneumothorax has resolved. Prominence of the right hilum is likely positional and is not significantly changed. Electronically Signed   By: Gerome Sam III M.D   On: 07/04/2018 01:01   Dg Chest Port 1  View  Result Date: 07/03/2018 CLINICAL DATA:  Right pneumothorax. EXAM: PORTABLE CHEST 1 VIEW COMPARISON:  Radiograph earlier this day at 0443 hour FINDINGS: Pigtail catheter is been removed. No significant change in moderate-sized right pneumothorax superior laterally. Unchanged heart size and mediastinal contours. Retrocardiac atelectasis. IMPRESSION: Removal of right pigtail catheter. Unchanged moderate right pneumothorax apical laterally. Electronically Signed   By: Narda Rutherford M.D.   On: 07/03/2018 23:46    Scheduled Meds: . enoxaparin (LOVENOX) injection  30 mg Subcutaneous Q24H  . feeding supplement  1 Container Oral TID BM  . levETIRAcetam  250 mg Oral Q24H  . levETIRAcetam  500 mg Oral Q24H  . metoprolol tartrate  12.5 mg Oral BID  . multivitamin with minerals  1 tablet Oral Daily  . polyvinyl alcohol  1 drop Both Eyes BID    Continuous Infusions:   Time spent: I have personally reviewed and interpreted on  07/04/2018 daily labs, tele strips, imagings as discussed above under date review session and assessment and plans.  I reviewed all nursing notes, pharmacy notes, consultant notes,  vitals, pertinent old records  I have discussed plan of care as described above with RN , patient  on 07/04/2018   Albertine Grates MD, PhD  [facility] Pager [phone]. If 7PM-7AM, please contact night-coverage at [email or web address], password [facility] 07/04/2018, 8:52 AM  LOS: 10 days

## 2018-07-04 NOTE — Progress Notes (Deleted)
Pt places self on home cpap. Will monitor 

## 2018-07-05 LAB — BASIC METABOLIC PANEL
Anion gap: 9 (ref 5–15)
BUN: 11 mg/dL (ref 8–23)
CALCIUM: 9 mg/dL (ref 8.9–10.3)
CO2: 26 mmol/L (ref 22–32)
CREATININE: 0.61 mg/dL (ref 0.44–1.00)
Chloride: 101 mmol/L (ref 98–111)
GFR calc Af Amer: >60 mL/min (ref >60)
GFR calc non Af Amer: >60 mL/min (ref >60)
GLUCOSE: 94 mg/dL (ref 70–99)
POTASSIUM: 3.8 mmol/L (ref 3.5–5.1)
Sodium: 136 mmol/L (ref 135–145)

## 2018-07-05 LAB — PHOSPHORUS: Phosphorus: 3.2 mg/dL (ref 2.5–4.6)

## 2018-07-05 LAB — MAGNESIUM: Magnesium: 1.8 mg/dL (ref 1.7–2.4)

## 2018-07-05 NOTE — Plan of Care (Signed)
Reposition q 2 hours and prn

## 2018-07-05 NOTE — Progress Notes (Signed)
PROGRESS NOTE  Isabella [name] ZOX:[id number] DOB: April 14, 1921 DOA: 06/23/2018 PCP: Mortimer Fries, PA  HPI/Recap of past 24 hours:  She pulled out  Her iv , she is wearing mittens, lethargic, not interactive  chest tube remain on suction, 600cc output last night per RN  No fever, no hypoxia, she does not appear in distress  Assessment/Plan: Principal Problem:   Recurrent spontaneous pneumothorax Active Problems:   Pneumothorax on right   Hypernatremia   Encounter for chest tube placement   Essential hypertension, benign   Acute metabolic encephalopathy   Protein-calorie malnutrition, severe  Recurrent Spontaneous  pneumothorax -s/p chest tube placement, will follow critical care input  Metabolic encephalopathy: Pre snf patient "speaks normally at baseline" She is nonverbal today Blood culture/urine culture negative, no fever Ct head no acute findings  hypokalemia: k 2.8 on presentation, replaced, normalized  Repeat k and check mag home meds lasix held She does not eat much, continue hold lasix  AKI: resolved  H/o seizure:continue home meds. keppra  Dementia/FTT/severe malnutrition:  -home meds risperidone, remeron , prn ativan held due to lethargy -no feeding tube per previous discussion -will reconsult palliative care    Code Status: DNR  Family Communication: patient   Disposition Plan: return to snf once cleared by critical care   Consultants:  Critical care  Procedures:  Chest tube placement  Antibiotics:  none   Objective: BP 123/78 (BP Location: Right Arm)   Pulse 78   Temp (!) 97.5 F (36.4 C) (Axillary)   Resp 15   Ht 5' (1.524 m)   Wt 46.8 kg   SpO2 98%   BMI 20.14 kg/m   Intake/Output Summary (Last 24 hours) at 07/05/2018 1213 Last data filed at 07/05/2018 0648 Gross per 24 hour  Intake 360 ml  Output -  Net 360 ml   Filed Weights   07/03/18 0431 07/04/18 0612 07/05/18 0418  Weight: 45.9 kg 43.9 kg 46.8 kg     Exam: Patient is examined daily including today on 07/05/2018, exams remain the same as of yesterday except that has changed    General:  Frail elderly, demented, open eyes, nonverbal, does not follow commands, chest tube on right   Cardiovascular: RRR  Respiratory: CTABL  Abdomen: Soft/ND/NT, positive BS  Musculoskeletal: No Edema  Neuro: alert, open eyes, non verbal, does not follow commands  Data Reviewed: Basic Metabolic Panel: Recent Labs  Lab 06/29/18 0427 06/30/18 0328 07/02/18 0952 07/05/18 0657  NA 140 140 136 136  K 3.1* 4.5 3.9 3.8  CL 107 107 101 101  CO2 26 23 28 26   GLUCOSE 97 92 102* 94  BUN 11 13 9 11   CREATININE 0.61 0.73 0.72 0.61  CALCIUM 8.7* 9.1 8.9 9.0  MG 1.7  --   --  1.8  PHOS  --   --   --  3.2   Liver Function Tests: No results for input(s): AST, ALT, ALKPHOS, BILITOT, PROT, ALBUMIN in the last 168 hours. No results for input(s): LIPASE, AMYLASE in the last 168 hours. No results for input(s): AMMONIA in the last 168 hours. CBC: Recent Labs  Lab 07/02/18 0952  WBC 5.4  HGB 13.1  HCT 41.0  MCV 93.0  PLT 257   Cardiac Enzymes:   No results for input(s): CKTOTAL, CKMB, CKMBINDEX, TROPONINI in the last 168 hours. BNP (last 3 results) No results for input(s): BNP in the last 8760 hours.  ProBNP (last 3 results) No results for input(s): PROBNP in the  last 8760 hours.  CBG: No results for input(s): GLUCAP in the last 168 hours.  No results found for this or any previous visit (from the past 240 hour(s)).   Studies: No results found.  Scheduled Meds: . enoxaparin (LOVENOX) injection  30 mg Subcutaneous Q24H  . feeding supplement  1 Container Oral TID BM  . levETIRAcetam  250 mg Oral Q24H  . levETIRAcetam  500 mg Oral Q24H  . metoprolol tartrate  12.5 mg Oral BID  . multivitamin with minerals  1 tablet Oral Daily  . polyvinyl alcohol  1 drop Both Eyes BID    Continuous Infusions:   Time spent: I have  personally reviewed and interpreted on  07/05/2018 daily labs,  imagings as discussed above under date review session and assessment and plans.  I reviewed all nursing notes, pharmacy notes, consultant notes,  vitals, pertinent old records  I have discussed plan of care as described above with RN , patient  on 07/05/2018   Albertine Grates MD, PhD  [facility] Pager [phone]. If 7PM-7AM, please contact night-coverage at [email or web address], password [facility] 07/05/2018, 12:13 PM  LOS: 11 days

## 2018-07-05 NOTE — Progress Notes (Signed)
    NAME:  Isabella [name], MRN:  [id number], DOB:  November 14, 1920, LOS: 11 ADMISSION DATE:  06/23/2018, CONSULTATION DATE:  06/24/2018 REFERRING MD:  Manus Gunning MD, CHIEF COMPLAINT:  Altered mental status   Brief History   84 yo female with recurrent spontaneous Rt PTX (previous episode in February 2019).  Chest tube placed by EDP.  PCCM consulted to manage chest tube.  Past Medical History  Vascular dementia, seizures, HTN, Hearing loss  Significant Hospital Events   Right sided Pneumothorax on presentation  Consults: date of consult/date signed off & final recs:    Procedures (surgical and bedside):  Chest Tube (pigtail)- 9/25 >>   Events:  9/26 PTX resolved on 20 cmH20 suction, transitioned to water seal 9/27 Recurrent R PTX on water seal. Placed back to 20 cmH20 10/1 Place chest tube to waterseal 10/2 chest tube placed back to suction 10/3 chest tube back to waterseal 10/4 increased PTX 10/5 chest tube done fell out >> replaced  Significant Diagnostic Tests:    Subjective:  Unable to provide hx.  Objective   Blood pressure (!) 110/95, pulse 73, temperature (!) 97.5 F (36.4 C), temperature source Axillary, resp. rate 15, height 5' (1.524 m), weight 46.8 kg, SpO2 100 %.        Intake/Output Summary (Last 24 hours) at 07/05/2018 1357 Last data filed at 07/05/2018 0800 Gross per 24 hour  Intake 360 ml  Output 60 ml  Net 300 ml   Filed Weights   07/03/18 0431 07/04/18 0612 07/05/18 0418  Weight: 45.9 kg 43.9 kg 46.8 kg    Examination:  General - frail Eyes - pupils reactive ENT - no sinus tenderness, no stridor Cardiac - regular rate/rhythm, no murmur Chest - right chest tube in place Abdomen - soft, non tender, + bowel sounds Extremities - no edema Skin - no rashes Neuro - moves extremities   Assessment & Plan:   Recurrent Rt PTX. - continue chest tube to suction -10 cm H2O - f/u CXR - if PTX continues to recur when transitioning to water seal, then might need  consider trying pleurodesis - she would not be a candidate for surgical intervention given her advance age and comorbid conditions  Goals of care. - DNR  Disposition / Summary of Today's Plan 07/05/18   F/u CXR   Labs   CBC: Recent Labs  Lab 07/02/18 0952  WBC 5.4  HGB 13.1  HCT 41.0  MCV 93.0  PLT 257    Basic Metabolic Panel: Recent Labs  Lab 06/29/18 0427 06/30/18 0328 07/02/18 0952 07/05/18 0657  NA 140 140 136 136  K 3.1* 4.5 3.9 3.8  CL 107 107 101 101  CO2 26 23 28 26   GLUCOSE 97 92 102* 94  BUN 11 13 9 11   CREATININE 0.61 0.73 0.72 0.61  CALCIUM 8.7* 9.1 8.9 9.0  MG 1.7  --   --  1.8  PHOS  --   --   --  3.2    Recent Labs  Lab 07/02/18 0952  WBC 5.4    Coralyn Helling, MD [facility] Pulmonary/Critical Care 07/05/2018, 1:57 PM

## 2018-07-06 ENCOUNTER — Inpatient Hospital Stay (HOSPITAL_COMMUNITY): Payer: Medicare Other

## 2018-07-06 NOTE — Progress Notes (Signed)
Informed by [facility] Nurse practitioner to remove chest tube because  {Chest Tube 'all the way out with stiches showing" [employer] gauze and 4x4. Dr Roda Shutters on  The unit made aware. Oxygen 2l[m via nasal canula applied. Patient in no acute respiratory distress.Will continue to monitor. Elvera Bicker. BSN, RN

## 2018-07-06 NOTE — Progress Notes (Signed)
CSW reached out to daughter regarding accepted SNF's, daughter was currently unavailable to speak and stated she will call social worker back to discuss.   [place], [place] [phone]

## 2018-07-06 NOTE — Progress Notes (Signed)
Palliative Medicine consult noted. Due to high referral volume, there may be a delay seeing this patient. Please call the Palliative Medicine Team office at [phone] if recommendations are needed in the interim.  Thank you for inviting us to see this patient.  No charge note 

## 2018-07-06 NOTE — Progress Notes (Signed)
Patient ID: Isabella [name], female   DOB: 09-20-1921, 84 y.o.   MRN: [id number]   Thank you for consulting the Palliative Medicine Team at [facility] to meet your patient's and family's needs.   The reason that you asked [place] to see your patient is to clarify GOC and emotional support  The Surrogate decision make is daughter Isabella [name]  This NP went directly to the patient's room in hopes of meeting with family at bedside.  Unfortunately no family at bedside and I was unable to contact by telephone.  Unable to leave phone message for family.  This nurse practitioner will make further attempts to contact family for conversation regarding current medical situation and clarification of goals of care in the morning  Your patient is unable to participate in medical decision making at this time 2/2 to dementia/ decreased cognition  Discussed with Dr. Joycelyn Das NP  Palliative Medicine Team Team Phone # [phone] Pager [phone]  No charge

## 2018-07-06 NOTE — Progress Notes (Addendum)
PROGRESS NOTE  Isabella [name] AVW:[id number] DOB: Jan 03, 1921 DOA: 06/23/2018 PCP: Mortimer Fries, PA  HPI/Recap of past 24 hours:  She is awake and interactive this am,  Daughter at bedside  chest tube remain on suction,   No fever, no hypoxia, she does not appear in distress  Assessment/Plan: Principal Problem:   Recurrent spontaneous pneumothorax Active Problems:   Pneumothorax on right   Hypernatremia   Encounter for chest tube placement   Essential hypertension, benign   Acute metabolic encephalopathy   Protein-calorie malnutrition, severe  Recurrent Spontaneous  pneumothorax -s/p chest tube placement,  -repeat cxr today "interval progression of the right pneumothorax" - will follow critical care input  10/7 pm addendum: patient pulled out chest tube again, case discussed with critical care, no plan to reinsert chest tube, palliative care consulted.   Metabolic encephalopathy: Pre snf patient "speaks normally at baseline" Blood culture/urine culture negative, no fever Ct head no acute findings She has been  nonverbal on presentation and initial hospital stay, today she is awake and talking, but heard of hearing, not able to tell me if she recognizes her daughter or not  hypokalemia: k 2.8 on presentation, replaced, normalized   mag 1.8 She does not eat much, continue hold lasix  AKI: resolved  H/o seizure:continue home meds. keppra  Dementia/FTT/severe malnutrition:  -home meds risperidone, remeron , prn ativan held due to lethargy -no feeding tube per previous discussion Per daughter, patient is from ALF, she ambulate without assistant and able to feed herself at baseline - palliative care reconsulted    Code Status: DNR  Family Communication: patient and daughter at bedside  Disposition Plan: not ready to discharge   Consultants:  Critical care  Procedures:  Chest tube placement  Antibiotics:  none   Objective: BP 115/65 (BP  Location: Right Arm)   Pulse 63   Temp 97.6 F (36.4 C) (Axillary)   Resp 16   Ht 5' (1.524 m)   Wt 45.6 kg   SpO2 100%   BMI 19.65 kg/m   Intake/Output Summary (Last 24 hours) at 07/06/2018 1328 Last data filed at 07/06/2018 1055 Gross per 24 hour  Intake 540 ml  Output 32 ml  Net 508 ml   Filed Weights   07/04/18 0612 07/05/18 0418 07/06/18 0352  Weight: 43.9 kg 46.8 kg 45.6 kg    Exam: Patient is examined daily including today on 07/06/2018, exams remain the same as of yesterday except that has changed    General:  Frail elderly, alert, talking, chest tube on right   Cardiovascular: RRR  Respiratory: CTABL  Abdomen: Soft/ND/NT, positive BS  Musculoskeletal: No Edema  Neuro: alert, open eyes, talking  Data Reviewed: Basic Metabolic Panel: Recent Labs  Lab 06/30/18 0328 07/02/18 0952 07/05/18 0657  NA 140 136 136  K 4.5 3.9 3.8  CL 107 101 101  CO2 23 28 26   GLUCOSE 92 102* 94  BUN 13 9 11   CREATININE 0.73 0.72 0.61  CALCIUM 9.1 8.9 9.0  MG  --   --  1.8  PHOS  --   --  3.2   Liver Function Tests: No results for input(s): AST, ALT, ALKPHOS, BILITOT, PROT, ALBUMIN in the last 168 hours. No results for input(s): LIPASE, AMYLASE in the last 168 hours. No results for input(s): AMMONIA in the last 168 hours. CBC: Recent Labs  Lab 07/02/18 0952  WBC 5.4  HGB 13.1  HCT 41.0  MCV 93.0  PLT 257   Cardiac  Enzymes:   No results for input(s): CKTOTAL, CKMB, CKMBINDEX, TROPONINI in the last 168 hours. BNP (last 3 results) No results for input(s): BNP in the last 8760 hours.  ProBNP (last 3 results) No results for input(s): PROBNP in the last 8760 hours.  CBG: No results for input(s): GLUCAP in the last 168 hours.  No results found for this or any previous visit (from the past 240 hour(s)).   Studies: Dg Chest Port 1 View  Result Date: 07/06/2018 CLINICAL DATA:  84 year old female with persistent right pneumothorax. EXAM: PORTABLE CHEST 1 VIEW  COMPARISON:  07/04/2018 and earlier. FINDINGS: Portable AP supine view at 0723 hours. The pigtail right chest tube remains in place and positioning appears adequate. The right pneumothorax is larger since 07/04/2018, with pleural edge visible along the lung periphery to the 7th lateral rib level. Stable lung volumes. Stable ventilation elsewhere. Stable cardiac size and mediastinal contours. Calcified aortic atherosclerosis. Negative visible bowel gas pattern. IMPRESSION: 1. Pigtail right chest tube positioning appears adequate, but interval progression of the right pneumothorax, now resembling that on 07/03/2018. 2. No new cardiopulmonary abnormality. Electronically Signed   By: Odessa Fleming M.D.   On: 07/06/2018 07:45    Scheduled Meds: . enoxaparin (LOVENOX) injection  30 mg Subcutaneous Q24H  . feeding supplement  1 Container Oral TID BM  . levETIRAcetam  250 mg Oral Q24H  . levETIRAcetam  500 mg Oral Q24H  . metoprolol tartrate  12.5 mg Oral BID  . multivitamin with minerals  1 tablet Oral Daily  . polyvinyl alcohol  1 drop Both Eyes BID    Continuous Infusions:   Time spent: , case discussed with palliative care I have personally reviewed and interpreted on  07/06/2018 daily labs,  imagings as discussed above under date review session and assessment and plans.  I reviewed all nursing notes, pharmacy notes, consultant notes,  vitals, pertinent old records  I have discussed plan of care as described above with RN , patient and daughter on 07/06/2018   Albertine Grates MD, PhD  [facility] Pager [phone]. If 7PM-7AM, please contact night-coverage at [email or web address], password [facility] 07/06/2018, 1:28 PM  LOS: 12 days

## 2018-07-06 NOTE — Progress Notes (Signed)
[address]       [place] [address]             [phone]      I was asked by Dr. Elsworth Soho to see Mrs. Isabella [name] re: air leak earlier today  I met with her and her daughter. Isabella [name] was no communicative  Noted that chest tube had come out on follow up CXR. It has been removed and plan is not to replace it, which is the appropriate decision in this case  Remo Lipps C. Roxan Hockey, MD [facility] [phone]

## 2018-07-06 NOTE — Progress Notes (Signed)
Follow-up chest x-ray obtained this afternoon.  This shows the right chest tube has since been pulled out and is outside the chest wall, the pneumothorax appears essentially unchanged. Bedside evaluation found the patient to be perfectly comfortable on room air with no respiratory distress saturations 99%. The entire chest tube was indeed outside of the body, nursing was instructed to remove suture in place dressing. Dr. Vassie Loll had spoken with thoracic surgery today and they do not recommend talc pleurodesis, either way this or any other therapy would require yet again invasive chest tube insertion.  We spoke to the patient's daughter over the phone [name].  Explained that the chest tube was now out and the patient looked entirely comfortable.  We do not recommend reinsertion of chest tube at this point.  Given her dementia and confusion the fear she will yet again pull it out.  I explained to the daughter that the patient does look comfortable and it certainly possible that things could remain stable from a pulmonary standpoint.  I also discussed the fact that sometimes pneumothorax can be fatal and if that were to be the case we would be looking at transitioning to comfort.  [name] agrees over the phone that she would not want further chest tube placement and understands that things could deteriorate at this point. Plan Continue DO NOT RESUSCITATE No further chest tube placement Should she develop respiratory distress we will transition to comfort Primary care physician made aware of plan Palliative care consult pending  Simonne Martinet ACNP-BC [facility] Pulmonary/Critical Care Pager # [phone] OR # [phone] if no answer

## 2018-07-06 NOTE — Progress Notes (Signed)
NAME:  Isabella [name], MRN:  [id number], DOB:  1920/11/28, LOS: 12 ADMISSION DATE:  06/23/2018, CONSULTATION DATE:  06/24/2018 REFERRING MD:  Manus Gunning MD, CHIEF COMPLAINT:  Altered mental status   Brief History   84 yo female with recurrent spontaneous Rt PTX (previous episode in February 2019).  Chest tube placed by EDP.  PCCM consulted to manage chest tube.  Past Medical History  Vascular dementia, seizures, HTN, Hearing loss  Significant Hospital Events   Right sided Pneumothorax on presentation  Consults: date of consult/date signed off & final recs:    Procedures (surgical and bedside):  Chest Tube (pigtail)- 9/25 >>   Events:  9/26 PTX resolved on 20 cmH20 suction, transitioned to water seal 9/27 Recurrent R PTX on water seal. Placed back to 20 cmH20 10/1 Place chest tube to waterseal 10/2 chest tube placed back to suction 10/3 chest tube back to waterseal 10/4 increased PTX 10/5 chest tube done fell out >> replaced  Significant Diagnostic Tests:    Subjective:   no obvious pai or resp distress  Objective   Blood pressure (!) 130/93, pulse 72, temperature (!) 97.1 F (36.2 C), temperature source Axillary, resp. rate 16, height 5' (1.524 m), weight 45.6 kg, SpO2 100 %.        Intake/Output Summary (Last 24 hours) at 07/06/2018 0924 Last data filed at 07/06/2018 0811 Gross per 24 hour  Intake 540 ml  Output 32 ml  Net 508 ml   Filed Weights   07/04/18 0612 07/05/18 0418 07/06/18 0352  Weight: 43.9 kg 46.8 kg 45.6 kg    Examination:  Gen. Thin, frail, elderly,, in no distress ENT - no thrush, no post nasal drip Neck: No JVD, no thyromegaly, no carotid bruits Lungs: no use of accessory muscles, no dullness to percussion, decreased on right without rales or rhonchi  Cardiovascular: Rhythm regular, heart sounds  normal, no murmurs or gallops, no peripheral edema Musculoskeletal: No deformities, no cyanosis or clubbing   Right chest tube appears to be caught in  the side rail and kinked, I relieved this and there was a little air leak noted and drainage  Assessment & Plan:   Recurrent Rt PTX. -Chest x-ray 10/7 shows recurrent pneumothorax with about 20% collapse along -I released kinking chest tube at the bedside - Keep chest tube to suction -20 cm H2O - f/u CXR - she is not be a candidate for surgical intervention given her advance age and comorbid conditions.  Doubt pleurodesis possible unless able to tolerate off suction -We will ask TCTS about Heimlich valve  Goals of care. - DNR  Disposition / Summary of Today's Plan 07/06/18   Discussed with daughter Meriam Sprague, she would be agreeable to discharge with Heimlich valve. If this is not possible, we will have to discuss removal of chest tube and comfort care   Labs   CBC: Recent Labs  Lab 07/02/18 0952  WBC 5.4  HGB 13.1  HCT 41.0  MCV 93.0  PLT 257    Basic Metabolic Panel: Recent Labs  Lab 06/30/18 0328 07/02/18 0952 07/05/18 0657  NA 140 136 136  K 4.5 3.9 3.8  CL 107 101 101  CO2 23 28 26   GLUCOSE 92 102* 94  BUN 13 9 11   CREATININE 0.73 0.72 0.61  CALCIUM 9.1 8.9 9.0  MG  --   --  1.8  PHOS  --   --  3.2    Recent Labs  Lab 07/02/18 0952  WBC  5.4    Farida Mcreynolds V. Vassie Loll MD [facility] Pulmonary/Critical Care 07/06/2018, 9:24 AM

## 2018-07-07 MED ORDER — POLYETHYLENE GLYCOL 3350 17 G PO PACK: 17.0000 g | PACK | Freq: Every day | ORAL | 0 refills | Status: DC

## 2018-07-07 MED ORDER — POLYETHYLENE GLYCOL 3350 17 G PO PACK
17.0000 g | PACK | Freq: Every day | ORAL | Status: DC
  Administered 2018-07-07: 17 g via ORAL
  Filled 2018-07-07: qty 1

## 2018-07-07 MED ORDER — BISACODYL 10 MG RE SUPP: 10.0000 mg | Freq: Every day | RECTAL | 0 refills | Status: DC | PRN

## 2018-07-07 MED ORDER — POTASSIUM CHLORIDE CRYS ER 20 MEQ PO TBCR: 20.0000 meq | EXTENDED_RELEASE_TABLET | ORAL | 0 refills | Status: DC

## 2018-07-07 MED ORDER — BISACODYL 10 MG RE SUPP
10.0000 mg | Freq: Once | RECTAL | Status: AC
  Administered 2018-07-07: 10 mg via RECTAL
  Filled 2018-07-07: qty 1

## 2018-07-07 MED ORDER — SENNOSIDES-DOCUSATE SODIUM 8.6-50 MG PO TABS: 1.0000 | ORAL_TABLET | Freq: Every day | ORAL | 0 refills | Status: DC

## 2018-07-07 NOTE — Progress Notes (Addendum)
Patient will discharge to [facility]  Anticipated discharge date: 07/07/18 Family notified: Rosetta Rupnow, daughter Transportation by: Sharin Mons  Nurse to call report to [phone]. Patient will go to room 1203 at the facility.   CSW signing off.  Abigail Butts, LCSWA  Clinical Social Worker

## 2018-07-07 NOTE — Progress Notes (Signed)
Gave report to [facility], Charity fundraiser from [employer]. Awaiting PTAR

## 2018-07-07 NOTE — Care Management Note (Addendum)
Case Management Note  Patient Details  Name: Isabella [name] MRN: [id number] Date of Birth: September 10, 1921  Subjective/Objective: Pt presented for Recurrent spontaneous pneumothorax- Chest tubes to suction- removed by patient. Plan to transition to SNF 07-07-18. Initially from [facility] ALF.                 Action/Plan: CSW following patient for SNF at [facility]. No further needs from CM at this time.   Expected Discharge Date:  07/07/18               Expected Discharge Plan:  Skilled Nursing Facility  In-House Referral:  Clinical Social Work  Discharge planning Services  CM Consult  Post Acute Care Choice:  NA Choice offered to:  NA  DME Arranged:  N/A DME Agency:  NA  HH Arranged:  NA HH Agency:  NA  Status of Service:  Completed, signed off  If discussed at [employer] of Stay Meetings, dates discussed:    Additional Comments:  Gala Lewandowsky, RN 07/07/2018, 2:17 PM

## 2018-07-07 NOTE — Care Management Important Message (Signed)
Important Message  Patient Details  Name: Isabella [name] MRN: [id number] Date of Birth: 1921-06-07   Medicare Important Message Given:  Yes    Orvetta Danielski P Story Vanvranken 07/07/2018, 3:22 PM

## 2018-07-07 NOTE — Clinical Social Work Placement (Signed)
   CLINICAL SOCIAL WORK PLACEMENT  NOTE  Date:  07/07/2018  Patient Details  Name: Amandajo Gonder MRN: [id number] Date of Birth: 1921-02-22  Clinical Social Work is seeking post-discharge placement for this patient at the Skilled  Nursing Facility level of care (*CSW will initial, date and re-position this form in  chart as items are completed):  Yes   Patient/family provided with [place] Clinical Social Work Department's list of facilities offering this level of care within the geographic area requested by the patient (or if unable, by the patient's family).  Yes   Patient/family informed of their freedom to choose among providers that offer the needed level of care, that participate in Medicare, Medicaid or managed care program needed by the patient, have an available bed and are willing to accept the patient.  Yes   Patient/family informed of [place]'s ownership interest in [facility] and [facility], as well as of the fact that they are under no obligation to receive care at these facilities.  PASRR submitted to EDS on 07/03/18     PASRR number received on 07/03/18     Existing PASRR number confirmed on       FL2 transmitted to all facilities in geographic area requested by pt/family on 07/03/18     FL2 transmitted to all facilities within larger geographic area on       Patient informed that his/her managed care company has contracts with or will negotiate with certain facilities, including the following:  [facility]     Yes   Patient/family informed of bed offers received.  Patient chooses bed at [facility]     Physician recommends and patient chooses bed at      Patient to be transferred to [facility] on 07/07/18.  Patient to be transferred to facility by PTAR     Patient family notified on 07/07/18 of transfer.  Name of family member notified:  Kevin Fenton     PHYSICIAN       Additional Comment:     _______________________________________________ Elder Cyphers, Student-Social Work 07/07/2018, 2:22 PM

## 2018-07-07 NOTE — Progress Notes (Signed)
Physical Therapy Treatment Patient Details Name: Isabella [name] MRN: [id number] DOB: 05-02-1921 Today's Date: 07/07/2018    History of Present Illness 84 yr old patient with PMHx of spontaneous tension PTX in February presents to [facility] on 9/25 with Altered mental status, unresponsive at facility, on evaluation found to have a Right sided PTX with no signs of tension.  has a past medical history of Dementia, Hearing loss, Hypertension, Seizures (HCC), Vascular dementia, and Weight loss.    PT Comments    Pt limited by poor cognition. Unable to amb today due to pt very distracted.    Follow Up Recommendations  SNF     Equipment Recommendations  Other (comment)(To be determined at next venue)    Recommendations for Other Services       Precautions / Restrictions Precautions Precautions: Fall    Mobility  Bed Mobility Overal bed mobility: Needs Assistance Bed Mobility: Supine to Sit     Supine to sit: Max assist     General bed mobility comments: Assist with all aspects due to cognition  Transfers Overall transfer level: Needs assistance Equipment used: 2 person hand held assist Transfers: Sit to/from [employer] Transfers Sit to Stand: Mod assist;+2 physical assistance         General transfer comment: Assist to bring hips up and for balance. Pt with posterior lean and flexed posture. Stood x 2.  Ambulation/Gait             General Gait Details: Unable to get pt to initiate stepping. Stood but with posterior lean and flexed posture   Stairs             Wheelchair Mobility    Modified Rankin (Stroke Patients Only)       Balance Overall balance assessment: Needs assistance Sitting-balance support: Bilateral upper extremity supported;Feet supported Sitting balance-Leahy Scale: Poor Sitting balance - Comments: min to mod assist to sit EOB due to posterior lean Postural control: Posterior lean Standing balance support: Bilateral upper extremity  supported Standing balance-Leahy Scale: Poor Standing balance comment: bilateral hand held assist. Mod assist for static standing                            Cognition Arousal/Alertness: Awake/alert Behavior During Therapy: WFL for tasks assessed/performed Overall Cognitive Status: No family/caregiver present to determine baseline cognitive functioning                                 General Comments: Pt with dementia. Did not follow commands. With tactile cues and gestures pt able to assist with basic mobility. Attention poor.      Exercises      General Comments General comments (skin integrity, edema, etc.): VSS      Pertinent Vitals/Pain Pain Assessment: Faces Faces Pain Scale: No hurt    Home Living                      Prior Function            PT Goals (current goals can now be found in the care plan section) Progress towards PT goals: Not progressing toward goals - comment    Frequency    Min 2X/week      PT Plan Current plan remains appropriate    Co-evaluation              AM-PAC PT "  6 Clicks" Daily Activity  Outcome Measure  Difficulty turning over in bed (including adjusting bedclothes, sheets and blankets)?: A Little Difficulty moving from lying on back to sitting on the side of the bed? : Unable Difficulty sitting down on and standing up from a chair with arms (e.g., wheelchair, bedside commode, etc,.)?: Unable Help needed moving to and from a bed to chair (including a wheelchair)?: Total Help needed walking in hospital room?: Total Help needed climbing 3-5 steps with a railing? : Total 6 Click Score: 8    End of Session Equipment Utilized During Treatment: Gait belt Activity Tolerance: Other (comment)(limited by cognition) Patient left: with call bell/phone within reach;in bed;with bed alarm set Nurse Communication: Mobility status PT Visit Diagnosis: Unsteadiness on feet (R26.81);Other abnormalities  of gait and mobility (R26.89);Muscle weakness (generalized) (M62.81)     Time: 1610-9604 PT Time Calculation (min) (ACUTE ONLY): 10 min  Charges:  $Therapeutic Activity: 8-22 mins                     [facility] PT Acute Rehabilitation Services Pager [phone] Office [phone]    Angelina Ok [facility] 07/07/2018, 11:28 AM

## 2018-07-07 NOTE — Discharge Summary (Signed)
Discharge Summary  Isabella [name] ZOX:[id number] DOB: 1921/04/30  PCP: Mortimer Fries, PA  Admit date: 06/23/2018 Discharge date: 07/07/2018  Time spent: , more than 50% time spent on coordination of care  Recommendations for Outpatient Follow-up:  1. F/u with SNF MD 2. Palliative care continue to follow, transition to hospice/comfort measures when appropriate.  no plan to reinsert chest tube per critical care and thoracic surgery, start oxygen supplement for comfort if needed.   Discharge Diagnoses:  Active Hospital Problems   Diagnosis Date Noted  . Recurrent spontaneous pneumothorax   . Protein-calorie malnutrition, severe 07/02/2018  . Acute metabolic encephalopathy 06/24/2018  . Essential hypertension, benign 11/10/2017  . Encounter for chest tube placement   . Hypernatremia 11/02/2017  . Pneumothorax on right 11/02/2017    Resolved Hospital Problems  No resolved problems to display.    Discharge Condition: stable  Diet recommendation: diet as tolerated  Filed Weights   07/05/18 0418 07/06/18 0352 07/07/18 0610  Weight: 46.8 kg 45.6 kg 45.9 kg    History of present illness: (per admitting MD Dr Gonzella Lex) Isabella [name]  is a 84 y.o. female, with a history of vascular dementia (moderate to severe), seizure disorder, hypertension, history of spontaneous pneumothorax (had tension pneumothorax in February this year), brought in to med [facility] by EMS from SNF with altered mental status.  She was last seen to be normal yesterday.  Per the staff at SNF she had slurring of speech (speaks normally at baseline).  No fevers, chills, nausea, vomiting, complains of dysuria, abdominal pain, diarrhea or shortness of breath.  No witnessed fall or trauma.  No recent seizures.  At baseline she needs assistance with her ADLs. In the ED she was found to have normal vitals with decreased breath sounds on the right side.  Patient was not following any commands no meningeal signs are  noted. Labs showed normal CBC, sodium of 146, normal potassium, BUN of 21 and creatinine of 1.08.  Glucose of 130.  Lactic acid was normal. Chest x-ray showed moderate to large right-sided pneumothorax increasing size since her last chest x-ray in February this year with a hazy perihilar and lower lung opacity suspecting atelectasis.  Head CT negative for acute findings.  Critical care consulted and patient had a right chest pigtail catheter placed.  Patient admitted to stepdown.  Hospital Course:  Principal Problem:   Recurrent spontaneous pneumothorax Active Problems:   Pneumothorax on right   Hypernatremia   Encounter for chest tube placement   Essential hypertension, benign   Acute metabolic encephalopathy   Protein-calorie malnutrition, severe  Recurrent Spontaneous  pneumothorax -s/p chest tube placement x2 and she pulled out chest tube x2 -cxr persistent right sided pneumothorax, she has not hypoxia at rest, not in respiratory distress -case discussed with critical care/thoracic surgery, no plan to reinsert chest tube, palliative care consulted, input appreciated -patient is to discharge to SNF, continue to follow with palliative care, start on oxygen supplement if needed for comfort, transition to hospice when appropriate, I have discussed with daughter over the phone, daughter understand the plan of care.   Metabolic encephalopathy: Blood culture/urine culture negative, no fever Ct head no acute findings She has been  nonverbal on presentation and initial hospital stay, today she is awake and talking, oriented to self.  hypokalemia: k 2.8 on presentation, replaced,normalized   mag 1.8 She does not eat much, discontinue lasix  AKI: Resolved  HTN:  she has elevated blood pressure initially, required lopressor, bp  now low normal, will not discharge on bp meds Suspect initial elevated bp due to stress She was not on bp meds previously  H/o seizure:continue home  meds. keppra No seizure in the hospital  Dementia/FTT/severe malnutrition:  -home meds risperidone, remeron , prn ativan held due to lethargy , she became more alert at time of discharge, no agitation, she is discharged on nightly remeron, risperidone and prn ativan discontinued. -no feeding tube/DNR per previous discussion - palliative care reconsulted, patient will need to continue follow with palliative care at snf, transition to hospice when appropriate, I have discussed this with daughter who expressed understanding    Code Status: DNR  Family Communication: patient and daughter at bedside on 10/7 and over the phone on 10/8 prior to discharge  Disposition Plan: SNF with palliative care, transition to hospice when appropriate    Consultants:  Critical care  Thoracic surgery   Procedures:  Chest tube placementx2, patient pulled out chest tube x2  Antibiotics:  none   Discharge Exam: BP 98/66 (BP Location: Right Arm)   Pulse 71   Temp 97.6 F (36.4 C) (Axillary)   Resp 16   Ht 5' (1.524 m)   Wt 45.9 kg   SpO2 95%   BMI 19.76 kg/m   General: NAD, pleasant and cooperative, only oriented to self, very hard of hearing Cardiovascular: RRR Respiratory: diminished on the right,   Discharge Instructions You were cared for by a hospitalist during your hospital stay. If you have any questions about your discharge medications or the care you received while you were in the hospital after you are discharged, you can call the unit and asked to speak with the hospitalist on call if the hospitalist that took care of you is not available. Once you are discharged, your primary care physician will handle any further medical issues. Please note that NO REFILLS for any discharge medications will be authorized once you are discharged, as it is imperative that you return to your primary care physician (or establish a relationship with a primary care physician if you do not  have one) for your aftercare needs so that they can reassess your need for medications and monitor your lab values.  Discharge Instructions    Diet general   Complete by:  As directed    Diet as tolerated   Increase activity slowly   Complete by:  As directed      Allergies as of 07/07/2018   No Known Allergies     Medication List    STOP taking these medications   aspirin 81 MG chewable tablet   furosemide 20 MG tablet Commonly known as:  LASIX   LORazepam 0.5 MG tablet Commonly known as:  ATIVAN   risperiDONE 0.25 MG tablet Commonly known as:  RISPERDAL   traMADol 50 MG tablet Commonly known as:  ULTRAM   UNABLE TO FIND     TAKE these medications   acetaminophen 500 MG tablet Commonly known as:  TYLENOL Take 1 tablet (500 mg total) by mouth 2 (two) times daily. What changed:  Another medication with the same name was removed. Continue taking this medication, and follow the directions you see here.   bisacodyl 10 MG suppository Commonly known as:  DULCOLAX Place 1 suppository (10 mg total) rectally daily as needed for moderate constipation.   levETIRAcetam 250 MG tablet Commonly known as:  KEPPRA Take 250 mg every evening by mouth. What changed:  Another medication with the same name was changed.  Make sure you understand how and when to take each.   levETIRAcetam 500 MG tablet Commonly known as:  KEPPRA Take 1 tablet (500 mg total) by mouth every morning. What changed:  when to take this   magnesium oxide 400 (241.3 Mg) MG tablet Commonly known as:  MAG-OX Take 1 tablet (400 mg total) by mouth daily.   Melatonin 3 MG Caps Take 3 mg by mouth at bedtime.   mirtazapine 15 MG tablet Commonly known as:  REMERON Take 7.5 mg by mouth at bedtime.   polyethylene glycol packet Commonly known as:  MIRALAX / GLYCOLAX Take 17 g by mouth daily.   potassium chloride SA 20 MEQ tablet Commonly known as:  K-DUR,KLOR-CON Take 1 tablet (20 mEq total) by mouth every  Monday, Wednesday, and Friday. Start taking on:  07/08/2018 What changed:  when to take this   senna-docusate 8.6-50 MG tablet Commonly known as:  Senokot-S Take 1 tablet by mouth at bedtime. What changed:  when to take this   SYSTANE 0.4-0.3 % Soln Generic drug:  Polyethyl Glycol-Propyl Glycol Place 1 drop into both eyes 2 (two) times daily.   Vitamin D 2000 units tablet Take 2,000 Units by mouth daily.      No Known Allergies  Contact information for follow-up providers    continue follow up with palliative care,transition to hospice when appropriate. Follow up.            Contact information for after-discharge care    Destination    [facility] .   Service:  Skilled Nursing Contact information: [address] [place] [address] [phone]                   The results of significant diagnostics from this hospitalization (including imaging, microbiology, ancillary and laboratory) are listed below for reference.    Significant Diagnostic Studies: Dg Chest 1 View  Result Date: 06/24/2018 CLINICAL DATA:  Altered mental status EXAM: CHEST  1 VIEW COMPARISON:  11/07/2017 chest radiograph. FINDINGS: Stable cardiomediastinal silhouette with normal heart size. Moderate to large right pneumothorax. No left pneumothorax. No pleural effusion. Hazy parahilar and lower right lung opacity. Clear left lung. IMPRESSION: Moderate to large right pneumothorax, significantly increased in size since the most recent chest radiograph of 11/07/2017. Hazy parahilar and lower right lung opacity, favor atelectasis. No definite contralateral mediastinal shift to suggest tension. Critical Value/emergent results were called by telephone at the time of interpretation on 06/24/2018 at 1:30 am to Dr. Glynn Octave , who verbally acknowledged these results. Electronically Signed   By: Delbert Phenix M.D.   On: 06/24/2018 01:32   Ct Head Wo  Contrast  Result Date: 06/24/2018 CLINICAL DATA:  84 year old female with altered mental status. EXAM: CT HEAD WITHOUT CONTRAST TECHNIQUE: Contiguous axial images were obtained from the base of the skull through the vertex without intravenous contrast. COMPARISON:  Head CT dated 11/02/2017 FINDINGS: Brain: There is age-related atrophy and chronic microvascular ischemic changes. There is no acute intracranial hemorrhage. No mass effect or midline shift. No extra-axial fluid collection. Vascular: No hyperdense vessel or unexpected calcification. Skull: Normal. Negative for fracture or focal lesion. Sinuses/Orbits: The visualized paranasal sinuses are clear. There is chronic bilateral mastoid opacification and underdevelopment. Other: None IMPRESSION: 1. No acute intracranial pathology. 2. Age-related atrophy and chronic microvascular ischemic changes. Electronically Signed   By: Elgie Collard M.D.   On: 06/24/2018 01:39   Dg Chest Port 1 View  Addendum  Date: 07/06/2018   ADDENDUM REPORT: 07/06/2018 15:37 ADDENDUM: These results were called by telephone at the time of interpretation on 07/06/2018 at 3:37 pm to Dr. Cyril Mourning , who verbally acknowledged these results. Electronically Signed   By: Tollie Eth M.D.   On: 07/06/2018 15:37   Result Date: 07/06/2018 CLINICAL DATA:  Right chest tube readjustment. Right-sided pneumothorax. EXAM: PORTABLE CHEST 1 VIEW COMPARISON:  0723 hours on the same day FINDINGS: The coil of a right-sided chest tube is now projecting over the right lateral costophrenic angle with tip projecting over the lateral soft tissues of the right chest wall. Unchanged 1 cm thick right-sided pneumothorax. No mediastinal shift. Heart size is normal. There is moderate aortic atherosclerosis. IMPRESSION: Apparent repositioning of the right-sided chest tube now projecting over the right lateral costophrenic angle. The coil however appears to project also within the lateral chest wall suggesting  that this has almost fallen out. No significant change in peripheral right-sided pneumothorax. Electronically Signed: By: Tollie Eth M.D. On: 07/06/2018 15:15   Dg Chest Port 1 View  Result Date: 07/06/2018 CLINICAL DATA:  84 year old female with persistent right pneumothorax. EXAM: PORTABLE CHEST 1 VIEW COMPARISON:  07/04/2018 and earlier. FINDINGS: Portable AP supine view at 0723 hours. The pigtail right chest tube remains in place and positioning appears adequate. The right pneumothorax is larger since 07/04/2018, with pleural edge visible along the lung periphery to the 7th lateral rib level. Stable lung volumes. Stable ventilation elsewhere. Stable cardiac size and mediastinal contours. Calcified aortic atherosclerosis. Negative visible bowel gas pattern. IMPRESSION: 1. Pigtail right chest tube positioning appears adequate, but interval progression of the right pneumothorax, now resembling that on 07/03/2018. 2. No new cardiopulmonary abnormality. Electronically Signed   By: Odessa Fleming M.D.   On: 07/06/2018 07:45   Dg Chest Port 1 View  Result Date: 07/04/2018 CLINICAL DATA:  Chest tube placement EXAM: PORTABLE CHEST 1 VIEW COMPARISON:  July 03, 2018 FINDINGS: The right pneumothorax has resolved after chest tube placement. Atelectasis is seen adjacent to the chest tube. Prominence of the right hilum persists, possibly due to positioning. IMPRESSION: Right pneumothorax has resolved. Prominence of the right hilum is likely positional and is not significantly changed. Electronically Signed   By: Gerome Sam III M.D   On: 07/04/2018 01:01   Dg Chest Port 1 View  Result Date: 07/03/2018 CLINICAL DATA:  Right pneumothorax. EXAM: PORTABLE CHEST 1 VIEW COMPARISON:  Radiograph earlier this day at 0443 hour FINDINGS: Pigtail catheter is been removed. No significant change in moderate-sized right pneumothorax superior laterally. Unchanged heart size and mediastinal contours. Retrocardiac atelectasis.  IMPRESSION: Removal of right pigtail catheter. Unchanged moderate right pneumothorax apical laterally. Electronically Signed   By: Narda Rutherford M.D.   On: 07/03/2018 23:46   Dg Chest Port 1 View  Result Date: 07/03/2018 CLINICAL DATA:  Pneumothorax. EXAM: PORTABLE CHEST 1 VIEW COMPARISON:  Radiograph of July 02, 2018. FINDINGS: Stable cardiomediastinal silhouette. Atherosclerosis of thoracic aorta is noted. Right-sided chest tube has been repositioned slightly more superiorly toward the apex. Moderate right pneumothorax is noted which is increased in size compared to prior exam. Elevated right hemidiaphragm is noted. Mild right basilar subsegmental atelectasis is noted. Left lung is clear. Bony thorax is unremarkable. IMPRESSION: Moderate right pneumothorax is noted which is increased compared to prior exam. Right-sided chest tube has been slightly repositioned with tip more superior. Electronically Signed   By: Lupita Raider, M.D.   On: 07/03/2018 07:30  Dg Chest Port 1 View  Result Date: 07/02/2018 CLINICAL DATA:  Follow-up right chest tube EXAM: PORTABLE CHEST 1 VIEW COMPARISON:  07/01/2018 FINDINGS: Right-sided pigtail catheter is again identified. The pneumothorax seen previously has resolved. A small amount of pleural fluid is noted capping the right lung. Left lung remains clear. No new focal abnormality is seen. Cardiac shadow is stable. Aortic calcifications are again noted. IMPRESSION: New small right pleural effusion along the apex. Previously seen pneumothorax has resolved. Electronically Signed   By: Alcide Clever M.D.   On: 07/02/2018 08:26   Dg Chest Port 1 View  Result Date: 07/01/2018 CLINICAL DATA:  Pneumothorax.  Chest tube. EXAM: PORTABLE CHEST 1 VIEW COMPARISON:  One-view chest x-ray 06/30/2018 FINDINGS: Heart size normal. Atherosclerotic calcifications are present at the aortic arch. A right-sided chest tube is in place. A small pneumothorax of approximately 15% is again  seen. There is some prominence of the pulmonary hila other focal airspace disease. There is no edema. Degenerative changes are present in the thoracic spine, without change. IMPRESSION: 1. Small pneumothorax approximately 15% is present despite chest tube remaining in place. 2. Low lung volumes. 3. Borderline cardiomegaly. 4. Aortic atherosclerosis. 5. These results were called by telephone at the time of interpretation on 07/01/2018 at 8:37 am to Tharon Aquas, RN, who verbally acknowledged these results. Electronically Signed   By: Marin Roberts M.D.   On: 07/01/2018 08:42   Dg Chest Port 1 View  Result Date: 06/30/2018 CLINICAL DATA:  Shortness of breath, chest tube, pneumothorax EXAM: PORTABLE CHEST 1 VIEW COMPARISON:  Portable exam 0704 hours compared to 06/28/2018 FINDINGS: Pigtail drainage catheter at RIGHT apex. Normal heart size, mediastinal contours, and pulmonary vascularity. Atherosclerotic calcifications aorta. Density at RIGHT apex unchanged without residual pneumothorax. Subsegmental atelectasis RIGHT upper lobe. LEFT lung clear. Bones demineralized. IMPRESSION: RIGHT thoracostomy tube without pneumothorax. Persistent subsegmental atelectasis RIGHT lung. Electronically Signed   By: Ulyses Southward M.D.   On: 06/30/2018 08:12   Dg Chest Portable 1 View  Result Date: 06/28/2018 CLINICAL DATA:  Follow-up pneumothorax. EXAM: PORTABLE CHEST 1 VIEW COMPARISON:  June 27, 2018 FINDINGS: The right chest tube terminates over the right apex. No pneumothorax identified. Prominence the right hilum persists, possibly positional. Recommend attention on follow-up. The heart, hila, mediastinum, lungs, and pleura are otherwise unchanged. IMPRESSION: Right chest tube. No pneumothorax. Stable prominence of the right hilum, possibly positional. Recommend attention on follow-up. Electronically Signed   By: Gerome Sam III M.D   On: 06/28/2018 10:16   Dg Chest Port 1 View  Result Date: 06/27/2018 CLINICAL  DATA:  Pneumothorax EXAM: PORTABLE CHEST 1 VIEW COMPARISON:  06/26/2018 FINDINGS: Stable right chest tube. Right pneumothorax resolved. Lungs are clear. Upper normal heart size. IMPRESSION: Stable right chest tube.  Right apical pneumothorax resolved. Electronically Signed   By: Jolaine Click M.D.   On: 06/27/2018 09:53   Dg Chest Port 1 View  Result Date: 06/26/2018 CLINICAL DATA:  Shortness of breath EXAM: PORTABLE CHEST 1 VIEW COMPARISON:  06/25/2018 FINDINGS: Cardiac shadow is stable. Aortic calcifications are again seen. Right pigtail catheter is again noted. Small recurrent right pneumothorax is seen. Mild persistent density in the left base is again seen. No acute bony abnormality is noted. IMPRESSION: Stable changes in the left base. Small recurrent right apical pneumothorax. These results will be called to the ordering clinician or representative by the Radiologist Assistant, and communication documented in the PACS or zVision Dashboard. Electronically Signed   By: Loraine Leriche  Lukens M.D.   On: 06/26/2018 07:06   Dg Chest Port 1 View  Result Date: 06/25/2018 CLINICAL DATA:  Followup right pneumothorax with a small caliber chest tube. EXAM: PORTABLE CHEST 1 VIEW COMPARISON:  06/25/2018. FINDINGS: Stable right chest pigtail catheter. No definite residual pneumothorax seen. Significantly improved left lower lobe airspace opacity. The remainder of the lungs are clear. Stable enlarged cardiac silhouette. Diffuse osteopenia. IMPRESSION: 1. No definite residual pneumothorax. 2. Significantly improved left lower lobe atelectasis or pneumonia. 3. Stable cardiomegaly. Electronically Signed   By: Beckie Salts M.D.   On: 06/25/2018 19:32   Dg Chest Port 1 View  Result Date: 06/25/2018 CLINICAL DATA:  Shortness of Breath EXAM: PORTABLE CHEST 1 VIEW COMPARISON:  06/24/2018 FINDINGS: Cardiac shadow is stable. Aortic calcifications are again seen. Left lung remains clear. Pigtail catheter is noted on the right.  Minimal pneumothorax remains although likely related to the tube positioning. No sizable effusion is noted. No bony abnormality is seen. IMPRESSION: Minimal stable right apical pneumothorax. Electronically Signed   By: Alcide Clever M.D.   On: 06/25/2018 10:53   Dg Chest Port 1 View  Result Date: 06/24/2018 CLINICAL DATA:  Follow-up examination status post chest tube adjustment. EXAM: PORTABLE CHEST 1 VIEW COMPARISON:  Prior radiograph from earlier the same day. FINDINGS: Stable cardiomegaly. Mediastinal silhouette unchanged. Aortic atherosclerosis. Right-sided chest tube again seen with tip now overlying the mid right upper lobe. Probable trace residual right apical pneumothorax, decreased from previous exam. Persistent bibasilar atelectatic changes. Right hemidiaphragm is elevated. No other new cardiopulmonary abnormality. Osseous structures unchanged. Soft tissue emphysema within the right chest wall related to chest tube placement. IMPRESSION: 1. Right-sided chest tube in place with tip now overlying the mid right upper lobe. Trace residual right apical pneumothorax, decreased from previous. 2. Persistent mild bibasilar atelectatic changes. No other new active cardiopulmonary disease. Electronically Signed   By: Rise Mu M.D.   On: 06/24/2018 04:47   Dg Chest Portable 1 View  Result Date: 06/24/2018 CLINICAL DATA:  Initial evaluation for right-sided chest tube placement. EXAM: PORTABLE CHEST 1 VIEW COMPARISON:  Prior radiograph from earlier the same day. FINDINGS: Stable cardiomegaly. Mediastinal silhouette within normal limits. Aortic atherosclerosis. Interval placement of right-sided pigtail chest tube with tip overlying the medial right upper lobe. Interval rule read span shin of previously seen right-sided pneumothorax. Residual small pneumothorax seen at the right lung apex, pleural edge overlying the inferior margin of the right third rib. Hazy and linear right basilar opacity likely  reflects atelectatic lung. Additional atelectatic changes at the left lung base. Mild perihilar vascular congestion without frank pulmonary edema. Osseous structures are unchanged. Soft tissue emphysema within the right chest wall related to chest tube placement. IMPRESSION: 1. Interval placement of pigtail right-sided chest tube with tip overlying the medial right upper lobe. Previously seen right-sided pneumothorax has largely re-expanded, with small residual right apical pneumothorax as above. 2. Hazy bibasilar opacities, likely atelectasis. 3. Cardiomegaly with perihilar vascular congestion without frank pulmonary edema. Electronically Signed   By: Rise Mu M.D.   On: 06/24/2018 03:30    Microbiology: No results found for this or any previous visit (from the past 240 hour(s)).   Labs: Basic Metabolic Panel: Recent Labs  Lab 07/02/18 0952 07/05/18 0657  NA 136 136  K 3.9 3.8  CL 101 101  CO2 28 26  GLUCOSE 102* 94  BUN 9 11  CREATININE 0.72 0.61  CALCIUM 8.9 9.0  MG  --  1.8  PHOS  --  3.2   Liver Function Tests: No results for input(s): AST, ALT, ALKPHOS, BILITOT, PROT, ALBUMIN in the last 168 hours. No results for input(s): LIPASE, AMYLASE in the last 168 hours. No results for input(s): AMMONIA in the last 168 hours. CBC: Recent Labs  Lab 07/02/18 0952  WBC 5.4  HGB 13.1  HCT 41.0  MCV 93.0  PLT 257   Cardiac Enzymes: No results for input(s): CKTOTAL, CKMB, CKMBINDEX, TROPONINI in the last 168 hours. BNP: BNP (last 3 results) No results for input(s): BNP in the last 8760 hours.  ProBNP (last 3 results) No results for input(s): PROBNP in the last 8760 hours.  CBG: No results for input(s): GLUCAP in the last 168 hours.     Signed:  Albertine Grates MD, PhD  [facility] 07/07/2018, 2:17 PM

## 2018-07-26 ENCOUNTER — Emergency Department (HOSPITAL_COMMUNITY): Payer: Medicare Other

## 2018-07-26 ENCOUNTER — Emergency Department (HOSPITAL_COMMUNITY)
Admission: EM | Admit: 2018-07-26 | Discharge: 2018-07-26 | Disposition: A | Discharge status: Home / Self Care | Payer: Medicare Other | Attending: Emergency Medicine | Admitting: Emergency Medicine

## 2018-07-26 ENCOUNTER — Encounter (HOSPITAL_COMMUNITY): Payer: Self-pay

## 2018-07-26 ENCOUNTER — Other Ambulatory Visit: Payer: Self-pay

## 2018-07-26 DIAGNOSIS — Y999 Unspecified external cause status: Secondary | ICD-10-CM | POA: Diagnosis not present

## 2018-07-26 DIAGNOSIS — Y9301 Activity, walking, marching and hiking: Secondary | ICD-10-CM | POA: Insufficient documentation

## 2018-07-26 DIAGNOSIS — I10 Essential (primary) hypertension: Secondary | ICD-10-CM | POA: Insufficient documentation

## 2018-07-26 DIAGNOSIS — S0083XA Contusion of other part of head, initial encounter: Secondary | ICD-10-CM

## 2018-07-26 DIAGNOSIS — J9381 Chronic pneumothorax: Secondary | ICD-10-CM | POA: Diagnosis not present

## 2018-07-26 DIAGNOSIS — W1830XA Fall on same level, unspecified, initial encounter: Secondary | ICD-10-CM | POA: Diagnosis not present

## 2018-07-26 DIAGNOSIS — R220 Localized swelling, mass and lump, head: Secondary | ICD-10-CM | POA: Diagnosis present

## 2018-07-26 DIAGNOSIS — F0390 Unspecified dementia without behavioral disturbance: Secondary | ICD-10-CM | POA: Insufficient documentation

## 2018-07-26 DIAGNOSIS — Y92129 Unspecified place in nursing home as the place of occurrence of the external cause: Secondary | ICD-10-CM | POA: Insufficient documentation

## 2018-07-26 DIAGNOSIS — Z79899 Other long term (current) drug therapy: Secondary | ICD-10-CM | POA: Insufficient documentation

## 2018-07-26 LAB — CBC WITH DIFFERENTIAL/PLATELET
ABS IMMATURE GRANULOCYTES: 0.01 10*3/uL (ref 0.00–0.07)
BASOS ABS: 0 10*3/uL (ref 0.0–0.1)
Basophils Relative: 0 %
EOS PCT: 1 %
Eosinophils Absolute: 0.1 10*3/uL (ref 0.0–0.5)
HCT: 40.3 % (ref 36.0–46.0)
HEMOGLOBIN: 12.6 g/dL (ref 12.0–15.0)
Immature Granulocytes: 0 %
LYMPHS PCT: 11 %
Lymphs Abs: 0.8 10*3/uL (ref 0.7–4.0)
MCH: 29.9 pg (ref 26.0–34.0)
MCHC: 31.3 g/dL (ref 30.0–36.0)
MCV: 95.7 fL (ref 80.0–100.0)
Monocytes Absolute: 0.5 10*3/uL (ref 0.1–1.0)
Monocytes Relative: 6 %
NEUTROS ABS: 6.2 10*3/uL (ref 1.7–7.7)
NEUTROS PCT: 82 %
NRBC: 0 % (ref 0.0–0.2)
Platelets: 302 10*3/uL (ref 150–400)
RBC: 4.21 MIL/uL (ref 3.87–5.11)
RDW: 14.4 % (ref 11.5–15.5)
WBC: 7.6 10*3/uL (ref 4.0–10.5)

## 2018-07-26 LAB — BASIC METABOLIC PANEL
ANION GAP: 6 (ref 5–15)
BUN: 12 mg/dL (ref 8–23)
CHLORIDE: 104 mmol/L (ref 98–111)
CO2: 28 mmol/L (ref 22–32)
Calcium: 8.6 mg/dL — ABNORMAL LOW (ref 8.9–10.3)
Creatinine, Ser: 0.71 mg/dL (ref 0.44–1.00)
GFR calc non Af Amer: >60 mL/min (ref >60)
Glucose, Bld: 88 mg/dL (ref 70–99)
POTASSIUM: 3.5 mmol/L (ref 3.5–5.1)
SODIUM: 138 mmol/L (ref 135–145)

## 2018-07-26 NOTE — ED Notes (Signed)
Patient transported to CT 

## 2018-07-26 NOTE — ED Notes (Signed)
Bed: WA04 Expected date:  Expected time:  Means of arrival:  Comments: 

## 2018-07-26 NOTE — ED Provider Notes (Signed)
Medical screening examination/treatment/procedure(s) were conducted as a shared visit with non-physician practitioner(s) and myself.  I personally evaluated the patient during the encounter.  None 84 year old female with history of dementia here after unwitnessed fall with right-sided facial contusion.  Patient does not appear to be dyspneic at this time.  No evidence of Eksir on patient's head or neck CT.  Does have a history of hydropneumothorax and according to the patient's old chart there is no further treatment for this being that patient is a DNR as well as hospice.   Lorre Nick, MD 07/26/18 [phone]

## 2018-07-26 NOTE — ED Notes (Signed)
[facility] called for transport.  

## 2018-07-26 NOTE — ED Triage Notes (Signed)
Pt BIB EMS from [facility] with a reported fall landing on her face. Noted swelling on right cheek at this time. Hx of dementia. Pt normally ambulatory without assistance.   134/82 RR 12 93% RA CBG 117

## 2018-07-26 NOTE — Discharge Instructions (Addendum)
Your x-ray showed that you had a right sided pneumothorax.  Please follow-up with your primary care provider to obtain supplemental oxygen as needed for comfort. Return to ED for worsening symptoms, injuries or falls, fever, chest pain or shortness of breath.

## 2018-07-26 NOTE — ED Notes (Signed)
Patient transported to X-ray 

## 2018-07-26 NOTE — ED Provider Notes (Signed)
[facility]-EMERGENCY DEPT Provider Note   CSN: [id number] Arrival date & time: 07/26/18  1255     History   Chief Complaint Chief Complaint  Patient presents with  . Fall  . Facial Swelling    HPI Isabella [name] is a 84 y.o. female with a past medical history of HTN, dementia, seizures, who presents to ED for evaluation of witnessed fall that occurred prior to arrival. She resides at a SNF. They state that she usually leans forward while walking at baseline unassisted. Because of this, she leaned too far forward and fell, directed landed on her face. She has swelling to her right cheek area. They deny any loss of consciousness or changes in mental status from baseline.  HPI  Past Medical History:  Diagnosis Date  . Dementia (HCC)   . Hearing loss   . Hypertension   . Seizures (HCC)   . Vascular dementia (HCC)   . Weight loss     Patient Active Problem List   Diagnosis Date Noted  . Protein-calorie malnutrition, severe 07/02/2018  . Acute metabolic encephalopathy 06/24/2018  . Recurrent spontaneous pneumothorax   . Essential hypertension, benign 11/10/2017  . Vascular dementia without behavioral disturbance (HCC) 11/10/2017  . Slow transit constipation 11/10/2017  . Hypokalemia 11/10/2017  . Hypomagnesemia 11/10/2017  . Chronic generalized pain disorder 11/10/2017  . Psychosis in elderly (HCC) 11/10/2017  . Encounter for chest tube placement   . Acute respiratory failure (HCC)   . Pneumothorax on right 11/02/2017  . Hypernatremia 11/02/2017  . CAP (community acquired pneumonia) 09/29/2016  . Altered mental status 09/27/2016  . Seizures (HCC) 09/27/2016    History reviewed. No pertinent surgical history.   OB History   None      Home Medications    Prior to Admission medications   Medication Sig Start Date End Date Taking? Authorizing Provider  acetaminophen (TYLENOL) 500 MG tablet Take 1 tablet (500 mg total) by mouth 2 (two) times  daily. 11/08/17  Yes Albertine Grates, MD  aspirin EC 81 MG tablet Take 81 mg by mouth daily.   Yes [provider]  Cholecalciferol (VITAMIN D) 2000 units tablet Take 2,000 Units by mouth daily.   Yes [provider]  furosemide (LASIX) 20 MG tablet Take 20 mg by mouth 3 (three) times a week.   Yes [provider]  levETIRAcetam (KEPPRA) 250 MG tablet Take 250 mg every evening by mouth.   Yes [provider]  levETIRAcetam (KEPPRA) 500 MG tablet Take 1 tablet (500 mg total) by mouth every morning. Patient taking differently: Take 500 mg by mouth daily.  11/08/17  Yes Albertine Grates, MD  LORazepam (ATIVAN) 0.5 MG tablet Take 0.25 mg by mouth daily as needed for anxiety.   Yes [provider]  magnesium oxide (MAG-OX) 400 (241.3 Mg) MG tablet Take 1 tablet (400 mg total) by mouth daily. 11/08/17  Yes Albertine Grates, MD  Melatonin 3 MG CAPS Take 3 mg by mouth at bedtime.   Yes [provider]  mirtazapine (REMERON) 15 MG tablet Take 7.5 mg by mouth at bedtime.   Yes [provider]  Polyethyl Glycol-Propyl Glycol (SYSTANE) 0.4-0.3 % SOLN Place 1 drop into both eyes 2 (two) times daily.   Yes [provider]  potassium chloride SA (K-DUR,KLOR-CON) 20 MEQ tablet Take 1 tablet (20 mEq total) by mouth every Monday, Wednesday, and Friday. Patient taking differently: Take 20 mEq by mouth daily.  07/08/18  Yes Xu,  Parke Poisson, MD  risperiDONE (RISPERDAL) 0.25 MG tablet Take 0.25 mg by mouth at bedtime.   Yes [provider]  senna-docusate (SENOKOT-S) 8.6-50 MG tablet Take 1 tablet by mouth at bedtime. Patient taking differently: Take 1 tablet by mouth daily.  11/08/17  Yes Albertine Grates, MD  traMADol (ULTRAM) 50 MG tablet Take 50 mg by mouth 2 (two) times daily as needed for moderate pain.   Yes [provider]  bisacodyl (DULCOLAX) 10 MG suppository Place 1 suppository (10 mg total) rectally daily as needed for moderate constipation. Patient not taking:  Reported on 07/26/2018 07/07/18   Albertine Grates, MD  polyethylene glycol [facility] Hal) packet Take 17 g by mouth daily. Patient not taking: Reported on 07/26/2018 07/07/18   Albertine Grates, MD    Family History Family History  Family history unknown: Yes    Social History Social History   Tobacco Use  . Smoking status: Never Smoker  . Smokeless tobacco: Never Used  Substance Use Topics  . Alcohol use: No  . Drug use: No     Allergies   Patient has no known allergies.   Review of Systems Review of Systems  Unable to perform ROS: Dementia     Physical Exam Updated Vital Signs BP (!) 170/110   Pulse 80   Temp 98.1 F (36.7 C)   Resp 17   Ht 5' (1.524 m)   Wt 45.9 kg   SpO2 93%   BMI 19.76 kg/m   Physical Exam  Constitutional: She appears well-developed and well-nourished. No distress.  Pleasantly demented. No acute distress noted.  HENT:  Head: Normocephalic and atraumatic.  Nose: Nose normal.  Eyes: Conjunctivae and EOM are normal. Right eye exhibits no discharge. Left eye exhibits no discharge. No scleral icterus.  Neck: Normal range of motion. Neck supple.  Cardiovascular: Normal rate, regular rhythm, normal heart sounds and intact distal pulses. Exam reveals no gallop and no friction rub.  No murmur heard. Pulmonary/Chest: Effort normal and breath sounds normal. No respiratory distress.  Normal WOB.  Abdominal: Soft. Bowel sounds are normal. She exhibits no distension. There is no tenderness. There is no guarding.  Musculoskeletal: Normal range of motion. She exhibits no edema.  Neurological: She is alert. She exhibits normal muscle tone. Coordination normal.  Patient alert and oriented to self.  Skin: Skin is warm and dry. No rash noted. She is not diaphoretic.  Right cheek swelling noted.  Psychiatric: She has a normal mood and affect.  Nursing note and vitals reviewed.    ED Treatments / Results  Labs (all labs ordered are listed, but only abnormal  results are displayed) Labs Reviewed  BASIC METABOLIC PANEL - Abnormal; Notable for the following components:      Result Value   Calcium 8.6 (*)    All other components within normal limits  CBC WITH DIFFERENTIAL/PLATELET    EKG None  Radiology Dg Chest 2 View  Result Date: 07/26/2018 CLINICAL DATA:  Larey Seat. EXAM: CHEST - 2 VIEW COMPARISON:  07/06/2018. FINDINGS: The right chest tube has been removed. There is an approximately 30% right lateral pneumothorax with a small to moderate-sized right pleural effusion. Patchy opacity is demonstrated in the inferior right lung and linear density in the upper right lung. The left lung is clear. Normal sized heart. Tortuous and calcified thoracic aorta. No interval mediastinal shift. Stable old, healed right lateral rib fractures with no definite acute fracture visualized. IMPRESSION: 1. Approximately 30% right hydropneumothorax without interval mediastinal  shift. 2. Patchy atelectasis, pulmonary contusion or pneumonia in the inferior right lung. 3. Right upper lobe atelectasis. Critical Value/emergent results were called by telephone at the time of interpretation on 07/26/2018 at 3:17 pm to [facility], PA-C , who verbally acknowledged these results. Electronically Signed   By: Beckie Salts M.D.   On: 07/26/2018 15:19   Ct Head Wo Contrast  Result Date: 07/26/2018 CLINICAL DATA:  Pt BIB EMS from [facility] with a reported fall landing on her face. Noted swelling on right cheek at this time. Right orbital swelling, patient unable to give history EXAM: CT HEAD WITHOUT CONTRAST CT MAXILLOFACIAL WITHOUT CONTRAST CT CERVICAL SPINE WITHOUT CONTRAST TECHNIQUE: Multidetector CT imaging of the head, cervical spine, and maxillofacial structures were performed using the standard protocol without intravenous contrast. Multiplanar CT image reconstructions of the cervical spine and maxillofacial structures were also generated. COMPARISON:  06/24/2018 CT FINDINGS: CT  HEAD FINDINGS Brain: There is central and cortical atrophy. Significant periventricular white matter changes are consistent with small vessel disease. There is no intra or extra-axial fluid collection or mass lesion. The basilar cisterns and ventricles have a normal appearance. There is no CT evidence for acute infarction or hemorrhage. Vascular: There is dense atherosclerotic calcification of the internal carotid arteries. Skull: Normal. Negative for fracture or focal lesion. Other: None. CT MAXILLOFACIAL FINDINGS Osseous: No fracture or mandibular dislocation. No destructive process. Orbits: There is preseptal soft tissue swelling involving the RIGHT orbit. Soft tissue swelling extends superficial to the RIGHT maxilla and zygomatic arch. The globes are intact bilaterally. Sinuses: Paranasal sinuses are clear. There are bilateral mastoid effusions. Soft tissues: Soft tissue swelling superficial to the RIGHT zygoma and maxilla; preseptal soft tissue swelling of the RIGHT orbit. CT CERVICAL SPINE FINDINGS Alignment: There is reversal of the UPPER cervical lordosis, likely degenerative in nature. Skull base and vertebrae: No acute fracture. No primary bone lesion or focal pathologic process. Remote superior endplate fractures of T2 and T3, consistent with osteoporosis type injury. Soft tissues and spinal canal: No prevertebral fluid or swelling. No visible canal hematoma. Disc levels:  Significant disc height loss at C3-4, C4-5, C5-6. Upper chest: There is a RIGHT-sided pneumothorax. RIGHT pleural effusion and RIGHT apical lung opacity. Further evaluation is needed. Other: None IMPRESSION: 1. RIGHT-sided hydropneumothorax warranting further evaluation. Consider CT of the chest. 2.  No evidence for acute intracranial abnormality. 3. Atrophy and small vessel disease. 4. Significant soft tissue swelling adjacent to the RIGHT maxilla, RIGHT zygoma. Preseptal soft tissue swelling of the RIGHT orbit. Globe is intact. 5. No  evidence for acute cervical spine abnormality. Significant mid cervical spondylosis. These results were called by telephone at the time of interpretation on 07/26/2018 at 2:31 pm to [facility], [facility] , who verbally acknowledged these results. Electronically Signed   By: Norva Pavlov M.D.   On: 07/26/2018 14:31   Ct Cervical Spine Wo Contrast  Result Date: 07/26/2018 CLINICAL DATA:  Pt BIB EMS from [facility] with a reported fall landing on her face. Noted swelling on right cheek at this time. Right orbital swelling, patient unable to give history EXAM: CT HEAD WITHOUT CONTRAST CT MAXILLOFACIAL WITHOUT CONTRAST CT CERVICAL SPINE WITHOUT CONTRAST TECHNIQUE: Multidetector CT imaging of the head, cervical spine, and maxillofacial structures were performed using the standard protocol without intravenous contrast. Multiplanar CT image reconstructions of the cervical spine and maxillofacial structures were also generated. COMPARISON:  06/24/2018 CT FINDINGS: CT HEAD FINDINGS Brain: There is central and cortical atrophy.  Significant periventricular white matter changes are consistent with small vessel disease. There is no intra or extra-axial fluid collection or mass lesion. The basilar cisterns and ventricles have a normal appearance. There is no CT evidence for acute infarction or hemorrhage. Vascular: There is dense atherosclerotic calcification of the internal carotid arteries. Skull: Normal. Negative for fracture or focal lesion. Other: None. CT MAXILLOFACIAL FINDINGS Osseous: No fracture or mandibular dislocation. No destructive process. Orbits: There is preseptal soft tissue swelling involving the RIGHT orbit. Soft tissue swelling extends superficial to the RIGHT maxilla and zygomatic arch. The globes are intact bilaterally. Sinuses: Paranasal sinuses are clear. There are bilateral mastoid effusions. Soft tissues: Soft tissue swelling superficial to the RIGHT zygoma and maxilla; preseptal soft tissue  swelling of the RIGHT orbit. CT CERVICAL SPINE FINDINGS Alignment: There is reversal of the UPPER cervical lordosis, likely degenerative in nature. Skull base and vertebrae: No acute fracture. No primary bone lesion or focal pathologic process. Remote superior endplate fractures of T2 and T3, consistent with osteoporosis type injury. Soft tissues and spinal canal: No prevertebral fluid or swelling. No visible canal hematoma. Disc levels:  Significant disc height loss at C3-4, C4-5, C5-6. Upper chest: There is a RIGHT-sided pneumothorax. RIGHT pleural effusion and RIGHT apical lung opacity. Further evaluation is needed. Other: None IMPRESSION: 1. RIGHT-sided hydropneumothorax warranting further evaluation. Consider CT of the chest. 2.  No evidence for acute intracranial abnormality. 3. Atrophy and small vessel disease. 4. Significant soft tissue swelling adjacent to the RIGHT maxilla, RIGHT zygoma. Preseptal soft tissue swelling of the RIGHT orbit. Globe is intact. 5. No evidence for acute cervical spine abnormality. Significant mid cervical spondylosis. These results were called by telephone at the time of interpretation on 07/26/2018 at 2:31 pm to [facility], [facility] , who verbally acknowledged these results. Electronically Signed   By: Norva Pavlov M.D.   On: 07/26/2018 14:31   Ct Maxillofacial Wo Cm  Result Date: 07/26/2018 CLINICAL DATA:  Pt BIB EMS from [facility] [address] with a reported fall landing on her face. Noted swelling on right cheek at this time. Right orbital swelling, patient unable to give history EXAM: CT HEAD WITHOUT CONTRAST CT MAXILLOFACIAL WITHOUT CONTRAST CT CERVICAL SPINE WITHOUT CONTRAST TECHNIQUE: Multidetector CT imaging of the head, cervical spine, and maxillofacial structures were performed using the standard protocol without intravenous contrast. Multiplanar CT image reconstructions of the cervical spine and maxillofacial structures were also generated. COMPARISON:  06/24/2018 CT  FINDINGS: CT HEAD FINDINGS Brain: There is central and cortical atrophy. Significant periventricular white matter changes are consistent with small vessel disease. There is no intra or extra-axial fluid collection or mass lesion. The basilar cisterns and ventricles have a normal appearance. There is no CT evidence for acute infarction or hemorrhage. Vascular: There is dense atherosclerotic calcification of the internal carotid arteries. Skull: Normal. Negative for fracture or focal lesion. Other: None. CT MAXILLOFACIAL FINDINGS Osseous: No fracture or mandibular dislocation. No destructive process. Orbits: There is preseptal soft tissue swelling involving the RIGHT orbit. Soft tissue swelling extends superficial to the RIGHT maxilla and zygomatic arch. The globes are intact bilaterally. Sinuses: Paranasal sinuses are clear. There are bilateral mastoid effusions. Soft tissues: Soft tissue swelling superficial to the RIGHT zygoma and maxilla; preseptal soft tissue swelling of the RIGHT orbit. CT CERVICAL SPINE FINDINGS Alignment: There is reversal of the UPPER cervical lordosis, likely degenerative in nature. Skull base and vertebrae: No acute fracture. No primary bone lesion or focal pathologic process. Remote superior endplate  fractures of T2 and T3, consistent with osteoporosis type injury. Soft tissues and spinal canal: No prevertebral fluid or swelling. No visible canal hematoma. Disc levels:  Significant disc height loss at C3-4, C4-5, C5-6. Upper chest: There is a RIGHT-sided pneumothorax. RIGHT pleural effusion and RIGHT apical lung opacity. Further evaluation is needed. Other: None IMPRESSION: 1. RIGHT-sided hydropneumothorax warranting further evaluation. Consider CT of the chest. 2.  No evidence for acute intracranial abnormality. 3. Atrophy and small vessel disease. 4. Significant soft tissue swelling adjacent to the RIGHT maxilla, RIGHT zygoma. Preseptal soft tissue swelling of the RIGHT orbit. Globe is  intact. 5. No evidence for acute cervical spine abnormality. Significant mid cervical spondylosis. These results were called by telephone at the time of interpretation on 07/26/2018 at 2:31 pm to [facility], PA , who verbally acknowledged these results. Electronically Signed   By: Norva Pavlov M.D.   On: 07/26/2018 14:31    Procedures Procedures (including critical care time)  Medications Ordered in ED Medications - No data to display   Initial Impression / Assessment and Plan / ED Course  I have reviewed the triage vital signs and the nursing notes.  Pertinent labs & imaging results that were available during my care of the patient were reviewed by me and considered in my medical decision making (see chart for details).  Clinical Course as of Jul 27 1599  Sun Jul 26, 2018  1457 CT of cervical spine shows R sided pneumothorax, will obtain CXR.   [HK]  1519 Radiologist states that chest x-ray shows 30% right-sided hydropneumothorax without mediastinal shift.   [HK]  1523 SpO2: 95 % [HK]    Clinical Course User Index [HK] Dietrich Pates, PA-C    84 year old female presents to ED for fall that occurred prior to arrival.  She resides at a SNF.  They state that she walks while leaning forward at her baseline without assistance.  She had a witnessed fall and landed on the right side of her face.  They deny any loss of consciousness.  States that her mental status is at her baseline which is consistent with her dementia.  On exam there is edema noted of the right cheek.  Patient is alert and oriented to self.  Otherwise unable to perform neurological exam.  CT of head, C-spine and maxillofacial were negative with the exception of right-sided pneumothorax.  Chest x-ray confirms this 30% right-sided hydropneumothorax.  Patient has a history of recurrent spontaneous pneumothoraces on the right side.  Most recent admission discharge was 07/06/2018 where she was had chest tube inserted and pulled  out x2.  Per hospitalist discharge summary on 10/7: Palliative care continue to follow, transition to hospice/comfort measures when appropriate.  no plan to reinsert chest tube per critical care and thoracic surgery, start oxygen supplement for comfort if needed. Patient discussed with and seen by my attending, Dr. Freida Busman. Her oxygen saturations have remained at >93% on room air. Patient in no respiratory distress. She appears overall well for her baseline.  Do not see any indication for treatment of this pneumothorax as it appears to be recurrent since February pain based on prior discharge and consult notes from her hospitalization this month.  She is DNR and hospice care. CBC, BMP unremarkable.  Will advise patient to follow-up with her PCP and obtain PRN supplemental oxygen.  Portions of this note were generated with Scientist, clinical (histocompatibility and immunogenetics). Dictation errors may occur despite best attempts at proofreading.  Final Clinical Impressions(s) / ED  Diagnoses   Final diagnoses:  Chronic pneumothorax  Contusion of face, initial encounter    ED Discharge Orders    None       Dietrich Pates, PA-C 07/26/18 1600    Lorre Nick, MD 07/29/18 [id number]

## 2018-09-18 ENCOUNTER — Encounter (HOSPITAL_COMMUNITY): Payer: Self-pay

## 2018-09-18 ENCOUNTER — Other Ambulatory Visit: Payer: Self-pay

## 2018-09-18 ENCOUNTER — Emergency Department (HOSPITAL_COMMUNITY)

## 2018-09-18 ENCOUNTER — Inpatient Hospital Stay (HOSPITAL_COMMUNITY)
Admission: EM | Admit: 2018-09-18 | Discharge: 2018-09-22 | DRG: 536 | Disposition: A | Discharge status: Skilled Nursing Facility | Source: Skilled Nursing Facility | Attending: Internal Medicine | Admitting: Internal Medicine

## 2018-09-18 DIAGNOSIS — Y92129 Unspecified place in nursing home as the place of occurrence of the external cause: Secondary | ICD-10-CM

## 2018-09-18 DIAGNOSIS — W19XXXA Unspecified fall, initial encounter: Secondary | ICD-10-CM | POA: Diagnosis present

## 2018-09-18 DIAGNOSIS — R569 Unspecified convulsions: Secondary | ICD-10-CM

## 2018-09-18 DIAGNOSIS — J9381 Chronic pneumothorax: Secondary | ICD-10-CM | POA: Diagnosis present

## 2018-09-18 DIAGNOSIS — E870 Hyperosmolality and hypernatremia: Secondary | ICD-10-CM | POA: Diagnosis present

## 2018-09-18 DIAGNOSIS — Z515 Encounter for palliative care: Secondary | ICD-10-CM | POA: Diagnosis present

## 2018-09-18 DIAGNOSIS — Z66 Do not resuscitate: Secondary | ICD-10-CM | POA: Diagnosis present

## 2018-09-18 DIAGNOSIS — S72001A Fracture of unspecified part of neck of right femur, initial encounter for closed fracture: Principal | ICD-10-CM | POA: Diagnosis present

## 2018-09-18 DIAGNOSIS — J9383 Other pneumothorax: Secondary | ICD-10-CM | POA: Diagnosis not present

## 2018-09-18 DIAGNOSIS — J939 Pneumothorax, unspecified: Secondary | ICD-10-CM | POA: Diagnosis not present

## 2018-09-18 DIAGNOSIS — R0602 Shortness of breath: Secondary | ICD-10-CM

## 2018-09-18 DIAGNOSIS — G40909 Epilepsy, unspecified, not intractable, without status epilepticus: Secondary | ICD-10-CM | POA: Diagnosis present

## 2018-09-18 DIAGNOSIS — I10 Essential (primary) hypertension: Secondary | ICD-10-CM | POA: Diagnosis present

## 2018-09-18 DIAGNOSIS — F0150 Vascular dementia without behavioral disturbance: Secondary | ICD-10-CM | POA: Diagnosis present

## 2018-09-18 DIAGNOSIS — S72009A Fracture of unspecified part of neck of unspecified femur, initial encounter for closed fracture: Secondary | ICD-10-CM | POA: Diagnosis present

## 2018-09-18 DIAGNOSIS — Z9181 History of falling: Secondary | ICD-10-CM

## 2018-09-18 DIAGNOSIS — K5901 Slow transit constipation: Secondary | ICD-10-CM | POA: Diagnosis present

## 2018-09-18 DIAGNOSIS — Z993 Dependence on wheelchair: Secondary | ICD-10-CM

## 2018-09-18 LAB — COMPREHENSIVE METABOLIC PANEL
ALT: 19 U/L (ref 0–44)
ANION GAP: 10 (ref 5–15)
AST: 18 U/L (ref 15–41)
Albumin: 3.2 g/dL — ABNORMAL LOW (ref 3.5–5.0)
Alkaline Phosphatase: 75 U/L (ref 38–126)
BILIRUBIN TOTAL: 0.8 mg/dL (ref 0.3–1.2)
BUN: 17 mg/dL (ref 8–23)
CO2: 26 mmol/L (ref 22–32)
Calcium: 9 mg/dL (ref 8.9–10.3)
Chloride: 110 mmol/L (ref 98–111)
Creatinine, Ser: 0.61 mg/dL (ref 0.44–1.00)
Glucose, Bld: 97 mg/dL (ref 70–99)
POTASSIUM: 3.6 mmol/L (ref 3.5–5.1)
Sodium: 146 mmol/L — ABNORMAL HIGH (ref 135–145)
TOTAL PROTEIN: 7.1 g/dL (ref 6.5–8.1)

## 2018-09-18 LAB — CBC WITH DIFFERENTIAL/PLATELET
Abs Immature Granulocytes: 0.02 10*3/uL (ref 0.00–0.07)
Basophils Absolute: 0 10*3/uL (ref 0.0–0.1)
Basophils Relative: 0 %
Eosinophils Absolute: 0.2 10*3/uL (ref 0.0–0.5)
Eosinophils Relative: 3 %
HCT: 39.3 % (ref 36.0–46.0)
Hemoglobin: 12.4 g/dL (ref 12.0–15.0)
Immature Granulocytes: 0 %
Lymphocytes Relative: 19 %
Lymphs Abs: 1.2 10*3/uL (ref 0.7–4.0)
MCH: 30 pg (ref 26.0–34.0)
MCHC: 31.6 g/dL (ref 30.0–36.0)
MCV: 95.2 fL (ref 80.0–100.0)
Monocytes Absolute: 0.6 10*3/uL (ref 0.1–1.0)
Monocytes Relative: 10 %
NEUTROS PCT: 68 %
Neutro Abs: 4.3 10*3/uL (ref 1.7–7.7)
Platelets: 288 10*3/uL (ref 150–400)
RBC: 4.13 MIL/uL (ref 3.87–5.11)
RDW: 13.7 % (ref 11.5–15.5)
WBC: 6.3 10*3/uL (ref 4.0–10.5)
nRBC: 0 % (ref 0.0–0.2)

## 2018-09-18 LAB — PROTIME-INR
INR: 1.08
Prothrombin Time: 13.9 seconds (ref 11.4–15.2)

## 2018-09-18 LAB — MAGNESIUM: Magnesium: 2 mg/dL (ref 1.7–2.4)

## 2018-09-18 LAB — APTT: APTT: 29 s (ref 24–36)

## 2018-09-18 LAB — MRSA PCR SCREENING: MRSA by PCR: NEGATIVE

## 2018-09-18 LAB — PHOSPHORUS: Phosphorus: 3.2 mg/dL (ref 2.5–4.6)

## 2018-09-18 MED ORDER — ONDANSETRON HCL 4 MG PO TABS: 4.0000 mg | ORAL_TABLET | Freq: Four times a day (QID) | ORAL | Status: DC | PRN

## 2018-09-18 MED ORDER — LEVETIRACETAM 100 MG/ML PO SOLN
500.0000 mg | Freq: Every morning | ORAL | Status: DC
  Administered 2018-09-19 – 2018-09-21 (×3): 500 mg via ORAL
  Filled 2018-09-18 (×5): qty 5

## 2018-09-18 MED ORDER — MIRTAZAPINE 15 MG PO TABS
7.5000 mg | ORAL_TABLET | Freq: Every day | ORAL | Status: DC
  Administered 2018-09-18 – 2018-09-21 (×4): 7.5 mg via ORAL
  Filled 2018-09-18 (×4): qty 1

## 2018-09-18 MED ORDER — MAGNESIUM OXIDE 400 (241.3 MG) MG PO TABS
400.0000 mg | ORAL_TABLET | Freq: Every day | ORAL | Status: DC
  Administered 2018-09-19 – 2018-09-21 (×3): 400 mg via ORAL
  Filled 2018-09-18 (×3): qty 1

## 2018-09-18 MED ORDER — LOPERAMIDE HCL 2 MG PO TABS: 2.0000 mg | ORAL_TABLET | Freq: Four times a day (QID) | ORAL | Status: DC | PRN

## 2018-09-18 MED ORDER — BISACODYL 10 MG RE SUPP: 10.0000 mg | Freq: Every day | RECTAL | Status: DC | PRN

## 2018-09-18 MED ORDER — POLYETHYL GLYCOL-PROPYL GLYCOL 0.4-0.3 % OP SOLN: 1.0000 [drp] | Freq: Two times a day (BID) | OPHTHALMIC | Status: DC

## 2018-09-18 MED ORDER — ONDANSETRON HCL 4 MG/2ML IJ SOLN: 4.0000 mg | Freq: Four times a day (QID) | INTRAMUSCULAR | Status: DC | PRN

## 2018-09-18 MED ORDER — ORAL CARE MOUTH RINSE
15.0000 mL | Freq: Two times a day (BID) | OROMUCOSAL | Status: DC
  Administered 2018-09-19 – 2018-09-22 (×6): 15 mL via OROMUCOSAL

## 2018-09-18 MED ORDER — OXYCODONE HCL 5 MG PO TABS: 5.0000 mg | ORAL_TABLET | ORAL | Status: DC | PRN

## 2018-09-18 MED ORDER — DEXTROSE-NACL 5-0.45 % IV SOLN
INTRAVENOUS | Status: AC
  Administered 2018-09-18: 16:00:00 via INTRAVENOUS

## 2018-09-18 MED ORDER — ACETAMINOPHEN 500 MG PO TABS
500.0000 mg | ORAL_TABLET | Freq: Two times a day (BID) | ORAL | Status: DC
  Administered 2018-09-18 – 2018-09-21 (×7): 500 mg via ORAL
  Filled 2018-09-18 (×7): qty 1

## 2018-09-18 MED ORDER — DEXTROSE 5 % AND 0.45 % NACL IV BOLUS
1000.0000 mL | Freq: Once | INTRAVENOUS | Status: AC
  Administered 2018-09-18: 1000 mL via INTRAVENOUS

## 2018-09-18 MED ORDER — POLYVINYL ALCOHOL 1.4 % OP SOLN
1.0000 [drp] | Freq: Two times a day (BID) | OPHTHALMIC | Status: DC
  Administered 2018-09-18 – 2018-09-22 (×8): 1 [drp] via OPHTHALMIC
  Filled 2018-09-18: qty 15

## 2018-09-18 MED ORDER — SENNOSIDES-DOCUSATE SODIUM 8.6-50 MG PO TABS
1.0000 | ORAL_TABLET | Freq: Every day | ORAL | Status: DC
  Administered 2018-09-18 – 2018-09-21 (×4): 1 via ORAL
  Filled 2018-09-18 (×4): qty 1

## 2018-09-18 MED ORDER — LORAZEPAM 0.5 MG PO TABS: 0.2500 mg | ORAL_TABLET | Freq: Every day | ORAL | Status: DC | PRN

## 2018-09-18 MED ORDER — LEVETIRACETAM 100 MG/ML PO SOLN
250.0000 mg | Freq: Every evening | ORAL | Status: DC
  Administered 2018-09-18 – 2018-09-21 (×4): 250 mg via ORAL
  Filled 2018-09-18 (×4): qty 2.5

## 2018-09-18 MED ORDER — POLYETHYLENE GLYCOL 3350 17 G PO PACK
17.0000 g | PACK | Freq: Every day | ORAL | Status: DC
  Administered 2018-09-19 – 2018-09-20 (×2): 17 g via ORAL
  Filled 2018-09-18 (×3): qty 1

## 2018-09-18 MED ORDER — HALOPERIDOL 0.5 MG PO TABS
0.2500 mg | ORAL_TABLET | Freq: Four times a day (QID) | ORAL | Status: DC
  Administered 2018-09-18 – 2018-09-21 (×8): 0.25 mg via ORAL
  Filled 2018-09-18 (×17): qty 0.5

## 2018-09-18 MED ORDER — RISPERIDONE 0.25 MG PO TABS
0.2500 mg | ORAL_TABLET | Freq: Every day | ORAL | Status: DC
  Administered 2018-09-18 – 2018-09-21 (×4): 0.25 mg via ORAL
  Filled 2018-09-18 (×4): qty 1

## 2018-09-18 MED ORDER — LEVETIRACETAM 500 MG PO TABS: 500.0000 mg | ORAL_TABLET | ORAL | Status: DC

## 2018-09-18 MED ORDER — ACETAMINOPHEN 325 MG PO TABS: 650.0000 mg | ORAL_TABLET | Freq: Four times a day (QID) | ORAL | Status: DC | PRN

## 2018-09-18 MED ORDER — ACETAMINOPHEN 650 MG RE SUPP: 650.0000 mg | Freq: Four times a day (QID) | RECTAL | Status: DC | PRN

## 2018-09-18 MED ORDER — LOPERAMIDE HCL 2 MG PO CAPS: 2.0000 mg | ORAL_CAPSULE | Freq: Four times a day (QID) | ORAL | Status: DC | PRN

## 2018-09-18 NOTE — Consult Note (Signed)
Reason for Consult:  Right hip femoral neck fracture Consulting Provider:  Rhunette CroftNanavati, MD [facility]  The patient is a 84 year old elderly female with dementia who is been significantly weakened over the last few months.  She does stay at a nursing care facility.  She had a mechanical fall within the last day.  She exhibited signs of hip pain and was [facility].  X-rays were obtained that show a femoral neck fracture.  There is slight displacement.  Orthopedic surgery is consulted for the possibility of surgical treatment versus nonoperative treatment.  She has been admitted to the medicine service.  She recently had a mechanical fall just under 2 months ago and sustained a pneumothorax.  The pneumothorax on today's chest x-ray is present but smaller than when compared to 2 months ago.  On examination the patient is demented.  She does though exhibit signs of pain and withdrawal to pain when we try to move her right hip.  X-rays are independently reviewed and do show a minimally displaced right hip femoral neck fracture.  I have talked in length to the patient's daughter.  We discussed operative and nonoperative treatment options.  My biggest concern is the fact that she has still a small pneumothorax and general anesthesia can certainly worsen this issue.  There is no guarantee that spinal anesthesia can be obtained given her significant dementia.  For now her daughter would like us to pursue a nonoperative treatment course which I agree with.  She would benefit from pain control and continued hospice care and even a palliative care consult.  We will still follow her along for if the patient's family's wishes change.  Certainly she is going to continue to exhibit significant pain from this injury.  She should remain nonweightbearing on that right side and essentially just mainly be in the bed given her weakness and considerable fall risk.  She does not need to be  n.p.o. from an orthopedic standpoint.

## 2018-09-18 NOTE — ED Provider Notes (Addendum)
[facility]   CSN: [id number] Arrival date & time: 09/18/18  0800     History   Chief Complaint Chief Complaint  Patient presents with  . Leg Pain    HPI Isabella [name] is a 84 y.o. female.  HPI Level 5 caveat for severe dementia.  84 year old brought into the ER after she had a fall last night.  Patient unable to give any meaningful history.  According to the nurse at [facility], patient either had a slip or a fall yesterday while at the nursing facility.  At baseline patient is mostly wheelchair-bound, however she was able to walk 2 or 3 months ago.  Patient is under hospice care because of vascular dementia.   Past Medical History:  Diagnosis Date  . Dementia (HCC)   . Hearing loss   . Hypertension   . Seizures (HCC)   . Vascular dementia (HCC)   . Weight loss     Patient Active Problem List   Diagnosis Date Noted  . Protein-calorie malnutrition, severe 07/02/2018  . Acute metabolic encephalopathy 06/24/2018  . Recurrent spontaneous pneumothorax   . Essential hypertension, benign 11/10/2017  . Vascular dementia without behavioral disturbance (HCC) 11/10/2017  . Slow transit constipation 11/10/2017  . Hypokalemia 11/10/2017  . Hypomagnesemia 11/10/2017  . Chronic generalized pain disorder 11/10/2017  . Psychosis in elderly (HCC) 11/10/2017  . Encounter for chest tube placement   . Acute respiratory failure (HCC)   . Pneumothorax on right 11/02/2017  . Hypernatremia 11/02/2017  . CAP (community acquired pneumonia) 09/29/2016  . Altered mental status 09/27/2016  . Seizures (HCC) 09/27/2016    No past surgical history on file.   OB History   No obstetric history on file.      Home Medications    Prior to Admission medications   Medication Sig Start Date End Date Taking? Authorizing Provider  acetaminophen (TYLENOL) 500 MG tablet Take 1 tablet (500 mg total) by mouth 2 (two) times daily. 11/08/17  Yes  Albertine Grates, MD  bisacodyl (DULCOLAX) 10 MG suppository Place 1 suppository (10 mg total) rectally daily as needed for moderate constipation. 07/07/18  Yes Albertine Grates, MD  furosemide (LASIX) 20 MG tablet Take 20 mg by mouth 3 (three) times a week.   Yes [provider]  haloperidol (HALDOL) 0.5 MG tablet Take 0.25 mg by mouth 4 (four) times daily.   Yes [provider]  levETIRAcetam (KEPPRA) 100 MG/ML solution Take 250 mg by mouth every evening.   Yes [provider]  levETIRAcetam (KEPPRA) 100 MG/ML solution Take 500 mg by mouth every morning.   Yes [provider]  loperamide (IMODIUM A-D) 2 MG tablet Take 2 mg by mouth 4 (four) times daily as needed for diarrhea or loose stools.   Yes [provider]  LORazepam (ATIVAN) 0.5 MG tablet Take 0.25 mg by mouth daily as needed for anxiety.   Yes [provider]  Melatonin 3 MG CAPS Take 3 mg by mouth at bedtime.   Yes [provider]  mirtazapine (REMERON) 15 MG tablet Take 7.5 mg by mouth at bedtime.   Yes [provider]  Polyethyl Glycol-Propyl Glycol (SYSTANE) 0.4-0.3 % SOLN Place 1 drop into both eyes 2 (two) times daily.   Yes [provider]  polyethylene glycol (MIRALAX / GLYCOLAX) packet Take 17 g by mouth daily. 07/07/18  Yes Albertine Grates, MD  potassium chloride SA (K-DUR,KLOR-CON) 20 MEQ tablet Take 1 tablet (20  mEq total) by mouth every Monday, Wednesday, and Friday. Patient taking differently: Take 20 mEq by mouth See admin instructions. Monday, Wednesday, Friday 07/08/18  Yes Albertine GratesXu, Fang, MD  risperiDONE (RISPERDAL) 0.25 MG tablet Take 0.25 mg by mouth at bedtime.   Yes [provider]  senna-docusate (SENOKOT-S) 8.6-50 MG tablet Take 1 tablet by mouth at bedtime. Patient taking differently: Take 1 tablet by mouth daily.  11/08/17  Yes Albertine GratesXu, Fang, MD  levETIRAcetam (KEPPRA) 500 MG tablet Take 1 tablet (500 mg total) by mouth every morning. Patient taking differently:  Take 500 mg by mouth daily.  11/08/17   Albertine GratesXu, Fang, MD  magnesium oxide (MAG-OX) 400 (241.3 Mg) MG tablet Take 1 tablet (400 mg total) by mouth daily. 11/08/17   Albertine GratesXu, Fang, MD    Family History Family History  Family history unknown: Yes    Social History Social History   Tobacco Use  . Smoking status: Never Smoker  . Smokeless tobacco: Never Used  Substance Use Topics  . Alcohol use: No  . Drug use: No     Allergies   Patient has no known allergies.   Review of Systems Review of Systems  Unable to perform ROS: Dementia     Physical Exam Updated Vital Signs BP 129/88 (BP Location: Right Arm)   Pulse 81   Temp 98.6 F (37 C) (Oral)   Resp 16   SpO2 99%   Physical Exam Vitals signs and nursing note reviewed.  Constitutional:      Appearance: She is well-developed.  HENT:     Head: Atraumatic.  Neck:     Musculoskeletal: Normal range of motion.     Comments: No midline c-spine tenderness, pt able to turn head to 45 degrees bilaterally without any pain and able to flex neck to the chest and extend without any pain or neurologic symptoms.  Cardiovascular:     Rate and Rhythm: Normal rate.  Pulmonary:     Effort: Pulmonary effort is normal.  Abdominal:     General: Bowel sounds are normal.  Musculoskeletal:     Comments: Patient is noted to have tenderness over both of her hips.  She specifically has tenderness with passive range of motion of the right hip/leg.  No gross deformity or significant edema noted  Skin:    General: Skin is warm and dry.  Neurological:     Mental Status: She is alert and oriented to person, place, and time.      ED Treatments / Results  Labs (all labs ordered are listed, but only abnormal results are displayed) Labs Reviewed  COMPREHENSIVE METABOLIC PANEL - Abnormal; Notable for the following components:      Result Value   Sodium 146 (*)    Albumin 3.2 (*)    All other components within normal limits  CBC WITH  DIFFERENTIAL/PLATELET  APTT  PROTIME-INR    EKG EKG Interpretation  Date/Time:  Friday September 18 2018 10:33:27 EST Ventricular Rate:  80 PR Interval:    QRS Duration: 89 QT Interval:  415 QTC Calculation: 479 R Axis:   -34 Text Interpretation:  Sinus rhythm Left axis deviation Nonspecific T abnormalities, anterior leads No acute changes No significant change since last tracing Confirmed by Derwood KaplanNanavati, Darlis Wragg [phone]([id number]) on 09/18/2018 10:43:01 AM   Radiology Dg Hips Bilat W Or Wo Pelvis 3-4 Views  Result Date: 09/18/2018 CLINICAL DATA:  Right hip pain. Possible fall. EXAM: DG HIP (WITH OR WITHOUT PELVIS) 3-4V BILAT COMPARISON:  None.  FINDINGS: There is linear lucency in the lateral aspect of the right femoral neck. The left hip has a normal appearance. Lower lumbar spine degenerative changes. Old, healed left inferior pubic ramus and left ischial fractures. IMPRESSION: Probable nondisplaced right femoral neck fracture. Confirmation with CT or MRI of the hip is recommended. Electronically Signed   By: Beckie Salts M.D.   On: 09/18/2018 09:12    Procedures Procedures (including critical care time)  Medications Ordered in ED Medications  dextrose 5 % and 0.45% NaCl 5-0.45 % bolus 1,000 mL (has no administration in time range)     Initial Impression / Assessment and Plan / ED Course  I have reviewed the triage vital signs and the nursing notes.  Pertinent labs & imaging results that were available during my care of the patient were reviewed by me and considered in my medical decision making (see chart for details).  Clinical Course as of Sep 19 1103  Fri Sep 18, 2018  1003 I called [facility] to get more information on patient's ADLs.  They report that patient has had a couple of falls within the last month.  That at baseline she is no longer ambulating, and is mostly wheelchair-bound.  The morning staff is not sure what might have transpired last night prior to patient being sent  to the ER.  I called patient's POA, Isabella [name] who is also her daughter.  She is coming to the ER.  She has been made aware that there is likely a femoral neck fracture.  She would want recommendations from orthopedics given that patient is uncomfortable with her fracture.  Ortho has been paged to see if there is any palliative treatment option available.  I also spoke with Isabella [name], who is the hospice nurse.  Someone from hospice team will be coming to assess the patient.  It appears that patient's hospice status is because of her severe dementia.   [AN]  1003 I spoke with Dr. Magnus Ivan, orthopedics. He has a full day of surgery, however has requested that patient be kept n.p.o. and they will see the patient as soon as they can. Medicine will admit.   [AN]  1101 X-ray shows a small pneumothorax. The pneumothorax is better than it was noted on the previous x-ray. It appears that she has chronic pneumonia.  She is not in any respiratory distress.  I do not think an acute chest tube is required.  DG Chest Port 1 View [AN]    Clinical Course User Index [AN] Derwood Kaplan, MD    84 year old comes in with chief complaint of fall.  History surrounding the circumstances of her fall is unclear from the morning staff, but it seems like she has had a couple of falls within the last few days.  Patient is mostly wheelchair-bound, but does have the ability to move around slightly.  X-rays show that she likely has a femoral neck fracture.  Spoke with Ms. Isabella [name], patient's daughter and POA at [phone].  She would want orthopedic recommendations and would be amenable for minimally invasive procedure if it would lead to better comfort. I also discussed the case with nurse Corina with patient's hospice care.  They will also send someone to the hospital to assess.  Her number is [phone].  Final Clinical Impressions(s) / ED Diagnoses   Final diagnoses:  Closed fracture of neck of  right femur, initial encounter [facility])    ED Discharge Orders    None  Derwood KaplanNanavati, Laketa Sandoz, MD 09/18/18 1100    Derwood KaplanNanavati, Isyss Espinal, MD 09/18/18 1104

## 2018-09-18 NOTE — Progress Notes (Signed)
[facility] ([facility]) GIP RN visit.  This is a related and covered GIP admission of 09/18/2018 with a [facility] Diagnosis of Cerebral atherosclerosis per Dr. Montez Moritaarter. Patient is DNR.  [facility] RN observed that patient was less mobile than usual with pain in right hip/leg area when she attempted movement. No witnessed falls were reported.   Visited patient at bedside, daughter- Meriam SpragueBeverly was present. Patient is alert but did not respond verbally to my voice. She was lying on her back and did not move much during visit. She is guarding her right leg/hip area.  BP 175/79, HR 77 and 100% SpO2 on room air.   Medications: No continuous medications at this time, no PRNs given so far.   Per MD notes: _ Patient was found to have a likely nondisplaced right femoral neck fracture and per discussion of the healthcare power of attorney and the EDP she wants orthopedic recommendations to see if there is any palliative treatment option available for the patient.  Dr. Magnus IvanBlackman discussed with Dr. Rhunette CroftNanavati and Dr. Magnus IvanBlackman recommended the patient remain n.p.o. at this time as his or schedule is full currently today but may be able to fit her in later today if deemed necessary.  ----orthopedic surgery has been consulted to see if operative management would help the patient in any way for palliative measures. __ -Likely nondisplaced right femoral neck fracture from probable fall -Admit to patient MedSurg -Pain control -Hip x-ray showed an old left inferior pubic ramus and ischial fractures and a probable nondisplaced right femoral neck fracture which is acute -Will defer to orthopedic surgery to obtain a CT or MRI of the hip to confirm fracture -Orthopedic surgery consulted for further evaluation and patient may undergo hip pinning for palliation but will have orthopedic surgery fully assess and make recommendations -Currently n.p.o. for possible intervention -Hospice team to follow -Head CT done  because of Fall and showed "Chronic atrophic and ischemic changes stable from the prior exam. No acute abnormality noted." -Chronic Right pneumothorax  Goals of Care:  DNR. Palliative care for comfort is the goal.  Discharge planning: Ongoing. Please call with any hospice related questions or concerns. Please use [facility] if ambulance transport is needed at time of discharge.  Communication with [facility]: Spoke with daughter at bedside.  Communication with [facility]: Team notified of admission.   Thank you,  Elsie SaasMary Anne Robertson, RN. [facility] Hospital Liaison (listed on [place]) [phone]

## 2018-09-18 NOTE — ED Notes (Signed)
ED TO INPATIENT HANDOFF REPORT  Name/Age/Gender Isabella SevinSarah [name] 84 y.o. female  Code Status Code Status History    Date Active Date Inactive Code Status Order ID Comments User Context   06/24/2018 0419 07/08/2018 0006 DNR [id number]  Briscoe Deutscherpyd, Timothy S, MD ED   11/02/2017 1543 11/08/2017 1826 DNR [id number]  Nyoka CowdenWert, Michael B, MD ED   09/30/2016 1039 09/30/2016 1607 DNR [id number]  Alm Bustard'Sullivan, Matthew, MD Inpatient   09/30/2016 0245 09/30/2016 1039 Full Code [id number]  Darreld McleanPatel, Vishal, MD Inpatient   09/27/2016 2112 09/28/2016 1727 Full Code [id number]  Gwynn BurlyWallace, Andrew, DO Inpatient    Questions for Most Recent Historical Code Status (Order [id number])    Question Answer Comment   In the event of cardiac or respiratory ARREST Do not call a "code blue"    In the event of cardiac or respiratory ARREST Do not perform Intubation, CPR, defibrillation or ACLS    In the event of cardiac or respiratory ARREST Use medication by any route, position, wound care, and other measures to relive pain and suffering. May use oxygen, suction and manual treatment of airway obstruction as needed for comfort.         Advance Directive Documentation     Most Recent Value  Type of Advance Directive  Out of facility DNR (pink MOST or yellow form)  Pre-existing out of facility DNR order (yellow form or pink MOST form)  Yellow form placed in chart (order not valid for inpatient use)  "MOST" Form in Place?  -      Home/SNF/Other Nursing Home  Chief Complaint right hip pain  Level of Care/Admitting Diagnosis ED Disposition    ED Disposition Condition Comment   Admit  [facility] [[id number]]  Level of Care: Med-Surg [16]  Diagnosis: Hip fracture [facility](HCC) [[id number]]  Admitting Physician: Marguerita MerlesSHEIKH, OMAIR LATIF [[id number]]  Attending Physician: Marguerita MerlesSHEIKH, OMAIR LATIF [[id number]]  Estimated length of stay: past midnight tomorrow  Certification:: I certify this patient will need inpatient services for at least 2  midnights  PT Class (Do Not Modify): Inpatient [101]  PT Acc Code (Do Not Modify): Private [1]       Medical History Past Medical History:  Diagnosis Date  . Dementia (HCC)   . Hearing loss   . Hypertension   . Seizures (HCC)   . Vascular dementia (HCC)   . Weight loss     Allergies No Known Allergies  IV Location/Drains/Wounds Patient Lines/Drains/Airways Status   Active Line/Drains/Airways    Name:   Placement date:   Placement time:   Site:   Days:   Peripheral IV 09/18/18 Right;Medial Forearm   09/18/18    1018    Forearm   less than 1          Labs/Imaging Results for orders placed or performed during the hospital encounter of 09/18/18 (from the past 48 hour(s))  Comprehensive metabolic panel     Status: Abnormal   Collection Time: 09/18/18 10:17 AM  Result Value Ref Range   Sodium 146 (H) 135 - 145 mmol/L   Potassium 3.6 3.5 - 5.1 mmol/L   Chloride 110 98 - 111 mmol/L   CO2 26 22 - 32 mmol/L   Glucose, Bld 97 70 - 99 mg/dL   BUN 17 8 - 23 mg/dL   Creatinine, Ser 6.430.61 0.44 - 1.00 mg/dL   Calcium 9.0 8.9 - 32.910.3 mg/dL   Total Protein 7.1 6.5 - 8.1 g/dL   Albumin 3.2 (L) 3.5 -  5.0 g/dL   AST 18 15 - 41 U/L   ALT 19 0 - 44 U/L   Alkaline Phosphatase 75 38 - 126 U/L   Total Bilirubin 0.8 0.3 - 1.2 mg/dL   GFR calc non Af Amer >60 >60 mL/min   GFR calc Af Amer >60 >60 mL/min   Anion gap 10 5 - 15    Comment: Performed at [facility], [address] [place], [place] [address]  CBC with Differential     Status: None   Collection Time: 09/18/18 10:17 AM  Result Value Ref Range   WBC 6.3 4.0 - 10.5 K/uL   RBC 4.13 3.87 - 5.11 MIL/uL   Hemoglobin 12.4 12.0 - 15.0 g/dL   HCT 60.439.3 54.036.0 - 98.146.0 %   MCV 95.2 80.0 - 100.0 fL   MCH 30.0 26.0 - 34.0 pg   MCHC 31.6 30.0 - 36.0 g/dL   RDW 19.113.7 47.811.5 - 29.515.5 %   Platelets 288 150 - 400 K/uL   nRBC 0.0 0.0 - 0.2 %   Neutrophils Relative % 68 %   Neutro Abs 4.3 1.7 - 7.7 K/uL   Lymphocytes Relative  19 %   Lymphs Abs 1.2 0.7 - 4.0 K/uL   Monocytes Relative 10 %   Monocytes Absolute 0.6 0.1 - 1.0 K/uL   Eosinophils Relative 3 %   Eosinophils Absolute 0.2 0.0 - 0.5 K/uL   Basophils Relative 0 %   Basophils Absolute 0.0 0.0 - 0.1 K/uL   Immature Granulocytes 0 %   Abs Immature Granulocytes 0.02 0.00 - 0.07 K/uL    Comment: Performed at [facility], [address] [place], [place] [address]  APTT     Status: None   Collection Time: 09/18/18 10:17 AM  Result Value Ref Range   aPTT 29 24 - 36 seconds    Comment: Performed at [facility], [address] [place], [place] [address]  Protime-INR     Status: None   Collection Time: 09/18/18 10:17 AM  Result Value Ref Range   Prothrombin Time 13.9 11.4 - 15.2 seconds   INR 1.08     Comment: Performed at [facility], [address] [place], [place] [address]   Ct Head Wo Contrast  Result Date: 09/18/2018 CLINICAL DATA:  Fall yesterday EXAM: CT HEAD WITHOUT CONTRAST TECHNIQUE: Contiguous axial images were obtained from the base of the skull through the vertex without intravenous contrast. COMPARISON:  07/26/2018 FINDINGS: Brain: Chronic atrophic and ischemic changes are again noted and stable. No findings to suggest acute hemorrhage, acute infarction or space-occupying mass lesion are seen. Vascular: No hyperdense vessel or unexpected calcification. Skull: Normal. Negative for fracture or focal lesion. Sinuses/Orbits: No acute finding. Other: None. IMPRESSION: Chronic atrophic and ischemic changes stable from the prior exam. No acute abnormality noted. Electronically Signed   By: Alcide CleverMark  Lukens M.D.   On: 09/18/2018 11:37   Dg Chest Port 1 View  Result Date: 09/18/2018 CLINICAL DATA:  Pain following fall EXAM: PORTABLE CHEST 1 VIEW COMPARISON:  July 26, 2018 FINDINGS: There is elevation of the right hemidiaphragm with loculated effusion on the right laterally. There is a  pneumothorax on the right in the apical and lateral regions, considerably smaller than on prior study. No tension component. There is scarring with volume loss in the left apex. The lungs elsewhere are clear. Heart is upper normal in size with pulmonary vascularity normal. No adenopathy. There is aortic  atherosclerosis. Bones are osteoporotic. There are old healed rib fractures on the right. IMPRESSION: Elevation right hemidiaphragm. Hydropneumothorax on the right, considerably smaller compared to 2 months prior. No tension component. Scarring with volume loss right apex. Left lung clear. Stable cardiac silhouette. There is aortic atherosclerosis. Aortic Atherosclerosis (ICD10-I70.0). Critical Value/emergent results were called by telephone at the time of interpretation on 09/18/2018 at 11:01 am to Dr. Derwood Kaplan , who verbally acknowledged these results. Electronically Signed   By: Bretta Bang III M.D.   On: 09/18/2018 11:01   Dg Hips Bilat W Or Wo Pelvis 3-4 Views  Result Date: 09/18/2018 CLINICAL DATA:  Right hip pain. Possible fall. EXAM: DG HIP (WITH OR WITHOUT PELVIS) 3-4V BILAT COMPARISON:  None. FINDINGS: There is linear lucency in the lateral aspect of the right femoral neck. The left hip has a normal appearance. Lower lumbar spine degenerative changes. Old, healed left inferior pubic ramus and left ischial fractures. IMPRESSION: Probable nondisplaced right femoral neck fracture. Confirmation with CT or MRI of the hip is recommended. Electronically Signed   By: Beckie Salts M.D.   On: 09/18/2018 09:12   EKG Interpretation  Date/Time:  Friday September 18 2018 10:33:27 EST Ventricular Rate:  80 PR Interval:    QRS Duration: 89 QT Interval:  415 QTC Calculation: 479 R Axis:   -34 Text Interpretation:  Sinus rhythm Left axis deviation Nonspecific T abnormalities, anterior leads No acute changes No significant change since last tracing Confirmed by Derwood Kaplan [phone]) on 09/18/2018  10:43:01 AM   Pending Labs Unresulted Labs (From admission, onward)    Start     Ordered   Signed and Held  Creatinine, serum  (heparin)  Once,   R    Comments:  Baseline for heparin therapy IF NOT ALREADY DRAWN.    Signed and Held   Signed and Held  Magnesium  Add-on,   R     Signed and Held   Signed and Held  Phosphorus  Add-on,   R     Signed and Held   Signed and Held  Comprehensive metabolic panel  Tomorrow morning,   R     Signed and Held   Signed and Held  CBC  Tomorrow morning,   R     Signed and Held          Vitals/Pain Today's Vitals   09/18/18 0817 09/18/18 0930 09/18/18 1000 09/18/18 1143  BP: 129/88 139/88 (!) 159/73 (!) 146/94  Pulse: 81 80 79 76  Resp: 16   16  Temp: 98.6 F (37 C)   (!) 97.4 F (36.3 C)  TempSrc: Oral   Oral  SpO2: 99% 95% 99% 98%    Isolation Precautions No active isolations  Medications Medications  dextrose 5 % and 0.45% NaCl 5-0.45 % bolus 1,000 mL (1,000 mLs Intravenous New Bag/Given 09/18/18 1140)    Mobility walks with person assist

## 2018-09-18 NOTE — Progress Notes (Signed)
PT Cancellation Note  Patient Details Name: Isabella SevinSarah Samad MRN: [id number] DOB: 08/25/1921   Cancelled Treatment:    Reason Eval/Treat Not Completed: Medical issues which prohibited therapy Awaiting orthopaedic surgeon input and plan of care.   Nayib Remer,[name] 09/18/2018, 2:58 PM Zenovia JarredKati Mayelin Panos, PT, DPT Acute Rehabilitation Services Office: [phone] Pager: [phone] '

## 2018-09-18 NOTE — H&P (Signed)
History and Physical    Isabella SevinSarah [name] WGN:[id number] DOB: 04/10/1921 DOA: 09/18/2018  PCP: Mortimer Friesurl, [place], [place]   Patient coming from: [facility]  Chief Complaint: Leg Pain and Less Mobility  HPI: Isabella SevinSarah [name] is a 84 y.o. 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  Patient is unable to provide a subjective history to me at this point in time due to her severe dementia and I attempted to call the healthcare power of attorney with no answer.  Also history is obtained from the EDP as well as chart review but patient either had a slip or fall yesterday and has fallen a few times in last few weeks.  Currently at baseline patient is mostly wheelchair-bound however she was able to walk 2 to 3 months ago and per facility is able to transfer.  She is currently under hospice care because of her severe vascular dementia and was brought in because of her leg pain.  Patient was found to have a likely nondisplaced right femoral neck fracture and per discussion of the healthcare power of attorney and the EDP she wants orthopedic recommendations to see if there is any palliative treatment option available for the patient.  Dr. Magnus IvanBlackman discussed with Dr. Rhunette CroftNanavati and Dr. Magnus IvanBlackman recommended the patient remain n.p.o. at this time as his or schedule is full currently today but may be able to fit her in later today if deemed necessary.  Further work-up revealed that patient has a pneumothorax which is chronic actually improved from prior imaging studies.  I discussed the case with Dr. Wynona Neatlalere recommends that this would not impact the patient's intubation surgery in any  way if needed.  [facility] was called to admit this patient for likely right hip fracture and orthopedic surgery has been consulted to see if operative management would help the patient in any way for palliative measures.  Hospice team has been informed and they will see the patient later today as well  ED Course: Basic blood work done as well as imaging of the hip, chest and head.  She is found to be hyponatremic.  Dr. Rhunette CroftNanavati discussed the case with the healthcare power of attorney who wants orthopedic evaluation and this probable hip fracture.  Review of Systems: As per HPI otherwise 10 point review of systems negative.   Past Medical History:  Diagnosis Date  . Dementia (HCC)   . Hearing loss   . Hypertension   . Seizures (HCC)   . Vascular dementia (HCC)   . Weight loss    SURGICAL HISTORY No past surgical history on file. And Patient unable to tell me due to her Dementia. I called the HCPOA but no answer.   SOCIAL HISTORY  reports that she has never smoked. She has never used smokeless tobacco. She reports that she does not drink alcohol or use drugs.  ALLERGIES No Known Allergies  Family History  Family history unknown: Yes  -Unable to obtain from the Patient as she is severely demented   Prior to Admission medications   Medication Sig Start Date End Date Taking? Authorizing Provider  acetaminophen (TYLENOL) 500 MG tablet Take 1 tablet (500 mg total) by mouth 2 (two) times daily. 11/08/17  Yes Albertine Grates, MD  bisacodyl (DULCOLAX) 10 MG suppository Place 1 suppository (10 mg total) rectally daily as needed for moderate constipation. 07/07/18  Yes Albertine Grates, MD  furosemide (LASIX) 20 MG tablet Take 20 mg by mouth 3 (three) times a week.   Yes [provider]  haloperidol (HALDOL) 0.5 MG tablet Take 0.25 mg by mouth 4 (four) times daily.   Yes [provider]  levETIRAcetam (KEPPRA) 100 MG/ML solution Take 250 mg by mouth every evening.   Yes [provider]    levETIRAcetam (KEPPRA) 100 MG/ML solution Take 500 mg by mouth every morning.   Yes [provider]  loperamide (IMODIUM A-D) 2 MG tablet Take 2 mg by mouth 4 (four) times daily as needed for diarrhea or loose stools.   Yes [provider]  LORazepam (ATIVAN) 0.5 MG tablet Take 0.25 mg by mouth daily as needed for anxiety.   Yes [provider]  Melatonin 3 MG CAPS Take 3 mg by mouth at bedtime.   Yes [provider]  mirtazapine (REMERON) 15 MG tablet Take 7.5 mg by mouth at bedtime.   Yes [provider]  Polyethyl Glycol-Propyl Glycol (SYSTANE) 0.4-0.3 % SOLN Place 1 drop into both eyes 2 (two) times daily.   Yes [provider]  polyethylene glycol (MIRALAX / GLYCOLAX) packet Take 17 g by mouth daily. 07/07/18  Yes Albertine Grates, MD  potassium chloride SA (K-DUR,KLOR-CON) 20 MEQ tablet Take 1 tablet (20 mEq total) by mouth every Monday, Wednesday, and Friday. Patient taking differently: Take 20 mEq by mouth See admin instructions. Monday, Wednesday, Friday 07/08/18  Yes Albertine Grates, MD  risperiDONE (RISPERDAL) 0.25 MG tablet Take 0.25 mg by mouth at bedtime.   Yes [provider]  senna-docusate (SENOKOT-S) 8.6-50 MG tablet Take 1 tablet by mouth at bedtime. Patient taking differently: Take 1 tablet by mouth daily.  11/08/17  Yes Albertine Grates, MD  levETIRAcetam (KEPPRA) 500 MG tablet Take 1 tablet (500 mg total) by mouth every morning. Patient taking differently: Take 500 mg by mouth daily.  11/08/17   Albertine Grates, MD  magnesium oxide (MAG-OX) 400 (241.3 Mg) MG tablet Take 1 tablet (400 mg total) by mouth daily. 11/08/17   Albertine Grates, MD   Physical Exam: Vitals:   09/18/18 0817 09/18/18 0930 09/18/18 1000 09/18/18 1143  BP: 129/88 139/88 (!) 159/73 (!) 146/94  Pulse: 81 80 79 76  Resp: 16   16  Temp: 98.6 F (37 C)   (!) 97.4 F (36.3 C)  TempSrc: Oral   Oral  SpO2: 99% 95% 99% 98%   Constitutional: WN/WD thin pleasantly demented AAF appears  calm and in NAD Eyes:  Lids and conjunctivae normal, sclerae anicteric  ENMT: External Ears, Nose appear normal. Hard of hearing.  Neck: Appears normal, supple, no cervical masses, normal ROM, no appreciable thyromegaly Respiratory: Diminished to auscultation bilaterally more so on the right but no appreciable wheezing, rales, rhonchi or crackles. Normal respiratory effort and patient is not tachypenic. No accessory muscle use.  Cardiovascular: RRR, no murmurs / rubs / gallops. S1 and S2 auscultated.  Abdomen: Soft, mildly tender, non-distended. No masses palpated. No appreciable hepatosplenomegaly. Bowel sounds positive x4 GU: Deferred. Musculoskeletal: No clubbing / cyanosis of digits/nails. Has some pain on Right Hip Palpation Skin: No rashes, lesions, ulcers on a limited skin evaluation No induration; Warm and dry.  Neurologic:  Does not follow commands. No appreciable focal deficits noted. Romberg sign and cerebellar reflexes  not assessed.  Psychiatric: Impaired judgment and insight. Pleasantly Demented and currently appears calm.    Labs on Admission: I have personally reviewed following labs and imaging studies  CBC: Recent Labs  Lab 09/18/18 1017  WBC 6.3  NEUTROABS 4.3  HGB 12.4  HCT 39.3  MCV 95.2  PLT 288   Basic Metabolic Panel: Recent Labs  Lab 09/18/18 1017  NA 146*  K 3.6  CL 110  CO2 26  GLUCOSE 97  BUN 17  CREATININE 0.61  CALCIUM 9.0   GFR: CrCl cannot be calculated (Unknown ideal weight.). Liver Function Tests: Recent Labs  Lab 09/18/18 1017  AST 18  ALT 19  ALKPHOS 75  BILITOT 0.8  PROT 7.1  ALBUMIN 3.2*   No results for input(s): LIPASE, AMYLASE in the last 168 hours. No results for input(s): AMMONIA in the last 168 hours. Coagulation Profile: Recent Labs  Lab 09/18/18 1017  INR 1.08   Cardiac Enzymes: No results for input(s): CKTOTAL, CKMB, CKMBINDEX, TROPONINI in the last 168 hours. BNP (last 3 results) No results for input(s):  PROBNP in the last 8760 hours. HbA1C: No results for input(s): HGBA1C in the last 72 hours. CBG: No results for input(s): GLUCAP in the last 168 hours. Lipid Profile: No results for input(s): CHOL, HDL, LDLCALC, TRIG, CHOLHDL, LDLDIRECT in the last 72 hours. Thyroid Function Tests: No results for input(s): TSH, T4TOTAL, FREET4, T3FREE, THYROIDAB in the last 72 hours. Anemia Panel: No results for input(s): VITAMINB12, FOLATE, FERRITIN, TIBC, IRON, RETICCTPCT in the last 72 hours. Urine analysis:    Component Value Date/Time   COLORURINE YELLOW 06/24/2018 0452   APPEARANCEUR CLEAR 06/24/2018 0452   LABSPEC 1.020 06/24/2018 0452   PHURINE 6.0 06/24/2018 0452   GLUCOSEU NEGATIVE 06/24/2018 0452   HGBUR NEGATIVE 06/24/2018 0452   BILIRUBINUR NEGATIVE 06/24/2018 0452   KETONESUR NEGATIVE 06/24/2018 0452   PROTEINUR NEGATIVE 06/24/2018 0452   UROBILINOGEN 0.2 04/30/2015 2118   NITRITE NEGATIVE 06/24/2018 0452   LEUKOCYTESUR NEGATIVE 06/24/2018 0452   Sepsis Labs: !!!!!!!!!!!!!!!!!!!!!!!!!!!!!!!!!!!!!!!!!!!! @LABRCNTIP (procalcitonin:4,lacticidven:4) )No results found for this or any previous visit (from the past 240 hour(s)).   Radiological Exams on Admission: Ct Head Wo Contrast  Result Date: 09/18/2018 CLINICAL DATA:  Fall yesterday EXAM: CT HEAD WITHOUT CONTRAST TECHNIQUE: Contiguous axial images were obtained from the base of the skull through the vertex without intravenous contrast. COMPARISON:  07/26/2018 FINDINGS: Brain: Chronic atrophic and ischemic changes are again noted and stable. No findings to suggest acute hemorrhage, acute infarction or space-occupying mass lesion are seen. Vascular: No hyperdense vessel or unexpected calcification. Skull: Normal. Negative for fracture or focal lesion. Sinuses/Orbits: No acute finding. Other: None. IMPRESSION: Chronic atrophic and ischemic changes stable from the prior exam. No acute abnormality noted. Electronically Signed   By: Alcide Clever M.D.   On: 09/18/2018 11:37   Dg Chest Port 1 View  Result Date: 09/18/2018 CLINICAL DATA:  Pain following fall EXAM: PORTABLE CHEST 1 VIEW COMPARISON:  July 26, 2018 FINDINGS: There is elevation of the right hemidiaphragm with loculated effusion on the right laterally. There is a pneumothorax on the right in the apical and lateral regions, considerably smaller than on prior study. No tension component. There is scarring with volume loss in the left apex. The lungs elsewhere are clear. Heart is upper normal in size with pulmonary vascularity normal. No adenopathy. There is aortic atherosclerosis. Bones are osteoporotic. There are old healed rib fractures on the right. IMPRESSION: Elevation right hemidiaphragm. Hydropneumothorax  on the right, considerably smaller compared to 2 months prior. No tension component. Scarring with volume loss right apex. Left lung clear. Stable cardiac silhouette. There is aortic atherosclerosis. Aortic Atherosclerosis (ICD10-I70.0). Critical Value/emergent results were called by telephone at the time of interpretation on 09/18/2018 at 11:01 am to Dr. Derwood KaplanANKIT NANAVATI , who verbally acknowledged these results. Electronically Signed   By: Bretta BangWilliam  Woodruff III M.D.   On: 09/18/2018 11:01   Dg Hips Bilat W Or Wo Pelvis 3-4 Views  Result Date: 09/18/2018 CLINICAL DATA:  Right hip pain. Possible fall. EXAM: DG HIP (WITH OR WITHOUT PELVIS) 3-4V BILAT COMPARISON:  None. FINDINGS: There is linear lucency in the lateral aspect of the right femoral neck. The left hip has a normal appearance. Lower lumbar spine degenerative changes. Old, healed left inferior pubic ramus and left ischial fractures. IMPRESSION: Probable nondisplaced right femoral neck fracture. Confirmation with CT or MRI of the hip is recommended. Electronically Signed   By: Beckie SaltsSteven  Reid M.D.   On: 09/18/2018 09:12   EKG: Independently reviewed.  Showed a sinus rhythm at a rate of 80 with nonspecific T  abnormalities and a QTC of 479.  No evidence of any ST elevation on my interpretation  Assessment/Plan Active Problems:   Seizures (HCC)   Pneumothorax on right   Hypernatremia   Essential hypertension, benign   Vascular dementia without behavioral disturbance (HCC)   Slow transit constipation   Recurrent spontaneous pneumothorax   Hip fracture (HCC)   Femoral neck fracture (HCC)  Likely nondisplaced right femoral neck fracture from probable fall -Admit to patient MedSurg -Pain control -Hip x-ray showed an old left inferior pubic ramus and ischial fractures and a probable nondisplaced right femoral neck fracture which is acute -Will defer to orthopedic surgery to obtain a CT or MRI of the hip to confirm fracture -Orthopedic surgery consulted for further evaluation and patient may undergo hip pinning for palliation but will have orthopedic surgery fully assess and make recommendations -Currently n.p.o. for possible intervention -Hospice team to follow -Head CT done because of Fall and showed "Chronic atrophic and ischemic changes stable from the prior exam. No acute abnormality noted."  Chronic Right pneumothorax -Is chronic -Is improved from prior study -I discussed the case with Dr. Wynona Neatlalere reviewed the films as well and states that intubation would not affect her pneumothorax is very small -Repeat chest x-ray in the a.m. -Currently does not need a chest tube as she is not in any respiratory distress and saturating 96% on room air  Seizure Disorder -Continue with levetiracetam 500 mg p.o. every morning and 250 mg p.o. every evening  Vascular dementia history of behavioral disturbances -Continue with lorazepam 0.25 mg daily PRN for anxiety, mirtazapine 7.5 mg p.o. nightly, haloperidol 0.25 mg 4 times a day, and risperidone 0.25 mg p.o. nightly  Hypertension -Currently holding her home Lasix 20 mg Monday Wednesday Friday -Patient was 146/94 -If necessary will place on IV  hydralazine but for now we will continue with pain control  Constipation -Is slow transit -Continue home regimen with bisacodyl suppository 10 mg daily PRN for moderate constipation, MiraLAX 17 g p.o. daily, senna docusate 1 tab p.o. nightly  Hypernatremia -Mild 146 -Was given IV fluids in the ED with D5 half-normal saline 1 L and will place on D5 half-normal saline at a rate of 50 mils per hour for 10 hours -Keep n.p.o. at this time for possible operative management for palliation  DVT prophylaxis: Heparin 5,000 units sq q8h  Code Status: DO NOT RESUSCITATE (From Prior Chart review as I was unable to get a hold of the HCPOA; She is currently on Hospice) Family Communication: None; Attempted to call the Daughter and HCPOA Disposition Plan: Back To [facility] with Hospice likely Consults called: EDP Called Orthopedic Surgery Dr. Magnus Ivan; I discussed the Case with Dr. Wynona Neat in Pulmonary about HydroPneumothorax Admission status: Inpatient Med Surge  Severity of Illness: The appropriate patient status for this patient is INPATIENT. Inpatient status is judged to be reasonable and necessary in order to provide the required intensity of service to ensure the patient's safety. The patient's presenting symptoms, physical exam findings, and initial radiographic and laboratory data in the context of their chronic comorbidities is felt to place them at high risk for further clinical deterioration. Furthermore, it is not anticipated that the patient will be medically stable for discharge from the hospital within 2 midnights of admission. The following factors support the patient status of inpatient.   " The patient's presenting symptoms include Leg Pain. " The worrisome physical exam findings include Pain on Hip Palpation. " The initial radiographic and laboratory data are worrisome because of Concern for a Non-Displaced Femoral Neck fracture. " The chronic co-morbidities are listed as above  * I  certify that at the point of admission it is my clinical judgment that the patient will require inpatient hospital care spanning beyond 2 midnights from the point of admission due to high intensity of service, high risk for further deterioration and high frequency of surveillance required.Merlene Laughter, D.O. [facility] PAGER is on AMION  If 7PM-7AM, please contact night-coverage [email or web address] Password [facility]  09/18/2018, 12:06 PM

## 2018-09-18 NOTE — ED Triage Notes (Signed)
This hospice resident of [facility] was observed by hospice nurse to be "less mobile" than usual. Pt. C/o pain at right hip/leg area whenever we attempt to move it. She is in no distress and staff at the nursing home were not certain if pt. Fell or not. She arrives in E.D. in no distress.

## 2018-09-18 NOTE — ED Notes (Signed)
Bed: WA17 Expected date:  Expected time:  Means of arrival:  Comments: EMS- fall, hip pain

## 2018-09-19 ENCOUNTER — Inpatient Hospital Stay (HOSPITAL_COMMUNITY)

## 2018-09-19 LAB — COMPREHENSIVE METABOLIC PANEL
ALK PHOS: 63 U/L (ref 38–126)
ALT: 22 U/L (ref 0–44)
AST: 26 U/L (ref 15–41)
Albumin: 2.7 g/dL — ABNORMAL LOW (ref 3.5–5.0)
Anion gap: 9 (ref 5–15)
BUN: 12 mg/dL (ref 8–23)
CO2: 24 mmol/L (ref 22–32)
Calcium: 8.7 mg/dL — ABNORMAL LOW (ref 8.9–10.3)
Chloride: 112 mmol/L — ABNORMAL HIGH (ref 98–111)
Creatinine, Ser: 0.67 mg/dL (ref 0.44–1.00)
GFR calc Af Amer: >60 mL/min (ref >60)
GFR calc non Af Amer: >60 mL/min (ref >60)
Glucose, Bld: 89 mg/dL (ref 70–99)
Potassium: 3.4 mmol/L — ABNORMAL LOW (ref 3.5–5.1)
SODIUM: 145 mmol/L (ref 135–145)
Total Bilirubin: 1.1 mg/dL (ref 0.3–1.2)
Total Protein: 5.9 g/dL — ABNORMAL LOW (ref 6.5–8.1)

## 2018-09-19 LAB — GLUCOSE, CAPILLARY
Glucose-Capillary: 80 mg/dL (ref 70–99)
Glucose-Capillary: 82 mg/dL (ref 70–99)

## 2018-09-19 LAB — CBC
HCT: 35.1 % — ABNORMAL LOW (ref 36.0–46.0)
Hemoglobin: 11 g/dL — ABNORMAL LOW (ref 12.0–15.0)
MCH: 30.1 pg (ref 26.0–34.0)
MCHC: 31.3 g/dL (ref 30.0–36.0)
MCV: 95.9 fL (ref 80.0–100.0)
Platelets: 241 10*3/uL (ref 150–400)
RBC: 3.66 MIL/uL — ABNORMAL LOW (ref 3.87–5.11)
RDW: 13.5 % (ref 11.5–15.5)
WBC: 4.4 10*3/uL (ref 4.0–10.5)
nRBC: 0 % (ref 0.0–0.2)

## 2018-09-19 MED ORDER — POTASSIUM CHLORIDE 20 MEQ PO PACK
40.0000 meq | PACK | Freq: Once | ORAL | Status: AC
  Administered 2018-09-19: 40 meq via ORAL
  Filled 2018-09-19: qty 2

## 2018-09-19 NOTE — Progress Notes (Signed)
[facility] GIP RN visit.  This is a related and covered GIP admission of 09/18/2018 with a [facility] Diagnosis of Cerebral atherosclerosis per Dr. Montez Moritaarter. Patient is DNR.  [facility] RN observed that patient was less mobile than usual with pain in right hip/leg area when she attempted movement. No witnessed falls were reported.   RN visited pt at bedside today with no family present. Pt asleep however arousalable. RN contacted pt's daughter Kevin FentonBeverly Harner and bedside nurse Toniann Fail(Wendy) with updates on pt's on pt's ongoing plan of care.   Initial plans was for pt to received PT therapy and palliative surgery for her right femoral fracture however this has been cancelled by the following noted per Dr. Magnus IvanBlackman. Per MD notes 09/19/2018 : (Dr. Doneen Poissonhristopher Blackman- Orthopedic) The patient is a 84 year old elderly female with dementia who is been significantly weakened over the last few months.  She does stay at a nursing care facility.  She had a mechanical fall within the last day.  She exhibited signs of hip pain and was [facility].  X-rays were obtained that show a femoral neck fracture.  There is slight displacement.  Orthopedic surgery is consulted for the possibility of surgical treatment versus nonoperative treatment.  She has been admitted to the medicine service.  She recently had a mechanical fall just under 2 months ago and sustained a pneumothorax.  The pneumothorax on today's chest x-ray is present but smaller than when compared to 2 months ago.  On examination the patient is demented.  She does though exhibit signs of pain and withdrawal to pain when we try to move her right hip.  X-rays are independently reviewed and do show a minimally displaced right hip femoral neck fracture.  I have talked in length to the patient's daughter.  We discussed operative and nonoperative treatment options.  My biggest concern is the fact that she has  still a small pneumothorax and general anesthesia can certainly worsen this issue.  There is no guarantee that spinal anesthesia can be obtained given her significant dementia.  For now her daughter would like us to pursue a nonoperative treatment course which I agree with.  She would benefit from pain control and continued hospice care and even a palliative care consult.  We will still follow her along for if the patient's family's wishes change.  Certainly she is going to continue to exhibit significant pain from this injury.  She should remain nonweightbearing on that right side and essentially just mainly be in the bed given her weakness and considerable fall risk.  She does not need to be n.p.o. from an orthopedic standpoint.  Nutrition: Per bedside nurse Toniann Fail(Wendy) indicates pt feed and tolerated applesauce with her crushed medications so a soft diet has been ordered and pt will be feed today to evaluate her tolerance.  GI/GU: No BM noted and pt with no reported void over night. No noted intervention.  Medications: Bedside nurse reports no PRN administered however Haldol ordered and scheduled q 4 hr with last dose at 10:00 AM. IV 20 G Forearm C/D/I.  Dementia: Nonverbal however may yell out upon movement due to her fracture hip. Pt is easily arousalable. Being followed with hospice at the facility At rest with no pain noted.  Communication: Spoke with daughter Kevin Fenton([name] Mickiewicz) and bedside nurse Toniann Fail(Wendy) today with updates on pt's ongoing plan of care.   Goals of Care:Pt to be discharged back to the facility  with involved hospice services. Pending disposition when pt to be discharge.   PPS- 20% total care bed-bound with minimal sips and tolerance for oral intake with applesauce. Will attempt soft diet today for feedings. Pt with fractured femoral and unable to complete any activities or mobility(ROM) at this time independently. Pt easily arousalable  and non-verbal baseline with only yelling when  repositioned as reported by the nurse Toniann Fail(Wendy) however asleep throughout the day.  SPO2-R/A & Vitals 12/21 141/87, HR-79, Temp 97.7  LABS: 12/21 H/H 11.0 & 35.1, RBC 3.66, Albumin 2.7, Chloride 112, Ca 8.7, K+ 3.4 X-ray chest PA & Lateral Pending result @ 12/21 1059  Please use [facility] for ambulance transport at time of discharge. Please call with any Hospice related questions or concerns.   Gerri SporeLisa Matthews,RN, [facility] Hospital Liaison [phone]  Hospice Liaison are on AMION

## 2018-09-19 NOTE — Progress Notes (Signed)
PROGRESS NOTE    Rudolpho SevinSarah [name]  NWG:[id number] DOB: 06/18/1921 DOA: 09/18/2018 PCP: Mortimer Friesurl, [place], PA  Outpatient Specialists:   Brief Narrative:  [name] is a 84 year old female, currently on hospice, with past medical history significant for vascular dementia and history of psychosis in the elderly, recurrent spontaneous pneumothorax on the right which is improving, hyponatremia, seizure disorder, hypertension and constipation.  Patient is a resident at the skilled nursing facility.  Patient was admitted following several falls.  Work-up revealed right non-displaced right hip fracture.  Orthopedic input is appreciated.  The hospice team input is appreciated.  After extensive discussion, the plan for now is to proceed with conservative approach.  Patient seen today.  Pain is well controlled, as long as patient is not mobilized.  Will discharge in the next 1 to 2 days.  Assessment & Plan:   Active Problems:   Seizures (HCC)   Pneumothorax on right   Hypernatremia   Essential hypertension, benign   Vascular dementia without behavioral disturbance (HCC)   Slow transit constipation   Recurrent spontaneous pneumothorax   Hip fracture (HCC)   Femoral neck fracture (HCC)   Likely nondisplaced right femoral neck fracture from probable fall -Admit to patient MedSurg -Pain control -Hip x-ray showed an old left inferior pubic ramus and ischial fractures and a probable nondisplaced right femoral neck fracture which is acute -Will defer to orthopedic surgery to obtain a CT or MRI of the hip to confirm fracture -Orthopedic surgery consulted for further evaluation and patient may undergo hip pinning for palliation but will have orthopedic surgery fully assess and make recommendations -Currently n.p.o. for possible intervention -Hospice team to follow -Head CT done because of Fall and showed "Chronic atrophic and ischemic changes stable from the prior exam. No acute abnormality noted." -09/19/2018:  For conservative approach for now.  To manage pain control.  Likely discharge in 1 to 2 days.  Chronic Right pneumothorax -Is chronic -Is improved from prior study -I discussed the case with Dr. Wynona Neatlalere reviewed the films as well and states that intubation would not affect her pneumothorax is very small -Repeat chest x-ray in the a.m. -Currently does not need a chest tube as she is not in any respiratory distress and saturating 96% on room air 09/19/2018: Stable.  Seizure Disorder -Continue with levetiracetam 500 mg p.o. every morning and 250 mg p.o. every evening  Vascular dementia history of behavioral disturbances -Continue with lorazepam 0.25 mg daily PRN for anxiety, mirtazapine 7.5 mg p.o. nightly, haloperidol 0.25 mg 4 times a day, and risperidone 0.25 mg p.o. nightly  Hypertension -Currently holding her home Lasix 20 mg Monday Wednesday Friday -Patient was 146/94 -If necessary will place on IV hydralazine but for now we will continue with pain control 09/19/2018: Continue to optimize.  Constipation -Is slow transit -Continue home regimen with bisacodyl suppository 10 mg daily PRN for moderate constipation, MiraLAX 17 g p.o. daily, senna docusate 1 tab p.o. nightly  Hypernatremia -Mild 146 -Was given IV fluids in the ED with D5 half-normal saline 1 L and will place on D5 half-normal saline at a rate of 50 mils per hour for 10 hours -Keep n.p.o. at this time for possible operative management for palliation 09/19/2018: Sodium is 145 today.  Potassium is 3.4.  Replete potassium.  Liberalize free water.  DVT prophylaxis: Subcu heparin. Code Status: Not resuscitate Family Communication:  Disposition Plan: Likely DC in 1 to 2 days.   Consultants:   Orthopedic  Hospitalist  Procedures:  None  Antimicrobials:   None   Subjective: Poor historian. Not in painful distress.  Objective: Vitals:   09/18/18 2107 09/19/18 0408 09/19/18 0500 09/19/18 1344  BP:  (!) 144/90 (!) 141/87  131/86  Pulse: 93 79  88  Resp: 16 16    Temp: 98.1 F (36.7 C) 97.7 F (36.5 C)  97.9 F (36.6 C)  TempSrc: Oral Oral  Oral  SpO2: 96% 98%  100%  Weight:   49 kg     Intake/Output Summary (Last 24 hours) at 09/19/2018 1423 Last data filed at 09/19/2018 1217 Gross per 24 hour  Intake 963.99 ml  Output 700 ml  Net 263.99 ml   Filed Weights   09/19/18 0500  Weight: 49 kg    Examination:  General exam: Appears calm and comfortable  Respiratory system: Clear to auscultation. Respiratory effort normal. Cardiovascular system: S1 & S2 heard. No pedal edema. Gastrointestinal system: Abdomen is nondistended, soft and nontender. No organomegaly or masses felt. Normal bowel sounds heard. Central nervous system: Awake and alert.    Data Reviewed: I have personally reviewed following labs and imaging studies  CBC: Recent Labs  Lab 09/18/18 1017 09/19/18 0609  WBC 6.3 4.4  NEUTROABS 4.3  --   HGB 12.4 11.0*  HCT 39.3 35.1*  MCV 95.2 95.9  PLT 288 241   Basic Metabolic Panel: Recent Labs  Lab 09/18/18 1017 09/19/18 0609  NA 146* 145  K 3.6 3.4*  CL 110 112*  CO2 26 24  GLUCOSE 97 89  BUN 17 12  CREATININE 0.61 0.67  CALCIUM 9.0 8.7*  MG 2.0  --   PHOS 3.2  --    GFR: Estimated Creatinine Clearance: 30.2 mL/min (by C-G formula based on SCr of 0.67 mg/dL). Liver Function Tests: Recent Labs  Lab 09/18/18 1017 09/19/18 0609  AST 18 26  ALT 19 22  ALKPHOS 75 63  BILITOT 0.8 1.1  PROT 7.1 5.9*  ALBUMIN 3.2* 2.7*   No results for input(s): LIPASE, AMYLASE in the last 168 hours. No results for input(s): AMMONIA in the last 168 hours. Coagulation Profile: Recent Labs  Lab 09/18/18 1017  INR 1.08   Cardiac Enzymes: No results for input(s): CKTOTAL, CKMB, CKMBINDEX, TROPONINI in the last 168 hours. BNP (last 3 results) No results for input(s): PROBNP in the last 8760 hours. HbA1C: No results for input(s): HGBA1C in the last 72  hours. CBG: Recent Labs  Lab 09/19/18 0638 09/19/18 0811  GLUCAP 80 82   Lipid Profile: No results for input(s): CHOL, HDL, LDLCALC, TRIG, CHOLHDL, LDLDIRECT in the last 72 hours. Thyroid Function Tests: No results for input(s): TSH, T4TOTAL, FREET4, T3FREE, THYROIDAB in the last 72 hours. Anemia Panel: No results for input(s): VITAMINB12, FOLATE, FERRITIN, TIBC, IRON, RETICCTPCT in the last 72 hours. Urine analysis:    Component Value Date/Time   COLORURINE YELLOW 06/24/2018 0452   APPEARANCEUR CLEAR 06/24/2018 0452   LABSPEC 1.020 06/24/2018 0452   PHURINE 6.0 06/24/2018 0452   GLUCOSEU NEGATIVE 06/24/2018 0452   HGBUR NEGATIVE 06/24/2018 0452   BILIRUBINUR NEGATIVE 06/24/2018 0452   KETONESUR NEGATIVE 06/24/2018 0452   PROTEINUR NEGATIVE 06/24/2018 0452   UROBILINOGEN 0.2 04/30/2015 2118   NITRITE NEGATIVE 06/24/2018 0452   LEUKOCYTESUR NEGATIVE 06/24/2018 0452   Sepsis Labs: @LABRCNTIP (procalcitonin:4,lacticidven:4)  ) Recent Results (from the past 240 hour(s))  MRSA PCR Screening     Status: None   Collection Time: 09/18/18  3:48 PM  Result Value Ref Range Status  MRSA by PCR NEGATIVE NEGATIVE Final    Comment:        The GeneXpert MRSA Assay (FDA approved for NASAL specimens only), is one component of a comprehensive MRSA colonization surveillance program. It is not intended to diagnose MRSA infection nor to guide or monitor treatment for MRSA infections. Performed at [facility], [address] [place], [place] [address]          Radiology Studies: X-ray Chest Pa And Lateral  Result Date: 09/19/2018 CLINICAL DATA:  Shortness of breath, history hypertension, dementia, seizures EXAM: CHEST - 2 VIEW COMPARISON:  09/18/2018 FINDINGS: Borderline enlargement of cardiac silhouette. Atherosclerotic calcification aorta. RIGHT hydropneumothorax with moderate RIGHT pleural effusion and interval increase in size of a RIGHT pneumothorax  since previous exam, estimated 10-15%. No mediastinal shift. LEFT lung clear. No LEFT pleural effusion or pneumothorax. Bones demineralized. IMPRESSION: RIGHT hydropneumothorax with slight increase in both the hydrothorax and pneumothorax component since the previous exam. Electronically Signed   By: Ulyses Southward M.D.   On: 09/19/2018 13:14   Ct Head Wo Contrast  Result Date: 09/18/2018 CLINICAL DATA:  Fall yesterday EXAM: CT HEAD WITHOUT CONTRAST TECHNIQUE: Contiguous axial images were obtained from the base of the skull through the vertex without intravenous contrast. COMPARISON:  07/26/2018 FINDINGS: Brain: Chronic atrophic and ischemic changes are again noted and stable. No findings to suggest acute hemorrhage, acute infarction or space-occupying mass lesion are seen. Vascular: No hyperdense vessel or unexpected calcification. Skull: Normal. Negative for fracture or focal lesion. Sinuses/Orbits: No acute finding. Other: None. IMPRESSION: Chronic atrophic and ischemic changes stable from the prior exam. No acute abnormality noted. Electronically Signed   By: Alcide Clever M.D.   On: 09/18/2018 11:37   Dg Chest Port 1 View  Result Date: 09/18/2018 CLINICAL DATA:  Pain following fall EXAM: PORTABLE CHEST 1 VIEW COMPARISON:  July 26, 2018 FINDINGS: There is elevation of the right hemidiaphragm with loculated effusion on the right laterally. There is a pneumothorax on the right in the apical and lateral regions, considerably smaller than on prior study. No tension component. There is scarring with volume loss in the left apex. The lungs elsewhere are clear. Heart is upper normal in size with pulmonary vascularity normal. No adenopathy. There is aortic atherosclerosis. Bones are osteoporotic. There are old healed rib fractures on the right. IMPRESSION: Elevation right hemidiaphragm. Hydropneumothorax on the right, considerably smaller compared to 2 months prior. No tension component. Scarring with volume  loss right apex. Left lung clear. Stable cardiac silhouette. There is aortic atherosclerosis. Aortic Atherosclerosis (ICD10-I70.0). Critical Value/emergent results were called by telephone at the time of interpretation on 09/18/2018 at 11:01 am to Dr. Derwood Kaplan , who verbally acknowledged these results. Electronically Signed   By: Bretta Bang III M.D.   On: 09/18/2018 11:01   Dg Hips Bilat W Or Wo Pelvis 3-4 Views  Result Date: 09/18/2018 CLINICAL DATA:  Right hip pain. Possible fall. EXAM: DG HIP (WITH OR WITHOUT PELVIS) 3-4V BILAT COMPARISON:  None. FINDINGS: There is linear lucency in the lateral aspect of the right femoral neck. The left hip has a normal appearance. Lower lumbar spine degenerative changes. Old, healed left inferior pubic ramus and left ischial fractures. IMPRESSION: Probable nondisplaced right femoral neck fracture. Confirmation with CT or MRI of the hip is recommended. Electronically Signed   By: Beckie Salts M.D.   On: 09/18/2018 09:12        Scheduled Meds: . acetaminophen  500  mg Oral BID  . haloperidol  0.25 mg Oral QID  . levETIRAcetam  250 mg Oral QPM  . levETIRAcetam  500 mg Oral q morning - 10a  . magnesium oxide  400 mg Oral Daily  . mouth rinse  15 mL Mouth Rinse BID  . mirtazapine  7.5 mg Oral QHS  . polyethylene glycol  17 g Oral Daily  . polyvinyl alcohol  1 drop Both Eyes BID  . potassium chloride  40 mEq Oral Once  . risperiDONE  0.25 mg Oral QHS  . senna-docusate  1 tablet Oral QHS   Continuous Infusions:   LOS: 1 day    Time spent: 35 minutes.  Berton MountSylvester Erlinda Solinger, MD  [facility] Pager #: [phone] 7PM-7AM contact night coverage as above

## 2018-09-19 NOTE — Progress Notes (Signed)
PT Cancellation Note  Patient Details Name: Rudolpho SevinSarah Beltre MRN: [id number] DOB: 08/12/1921   Cancelled Treatment:    Reason Eval/Treat Not Completed: PT screened, no needs identified, will sign off; noted pt with history of dementia on Hospice care at facility.  Now with displaced femoral neck fracture and caregivers have opted for non-operative treatment with MD suggesting NWB and bedrest.  No skilled PT needs at this time, though recommend nursing program for mobility during daily care tasks and positioning for comfort and skin protection.  Please consult again if needs arise.  Thanks.   Elray McgregorCynthia Nolia Tschantz 09/19/2018, 9:24 AM  Sheran Lawlessyndi Kenise Barraco, PT Acute Rehabilitation Services [phone] 09/19/2018

## 2018-09-20 LAB — RENAL FUNCTION PANEL
Albumin: 2.8 g/dL — ABNORMAL LOW (ref 3.5–5.0)
Anion gap: 10 (ref 5–15)
BUN: 12 mg/dL (ref 8–23)
CO2: 24 mmol/L (ref 22–32)
Calcium: 8.8 mg/dL — ABNORMAL LOW (ref 8.9–10.3)
Chloride: 110 mmol/L (ref 98–111)
Creatinine, Ser: 0.59 mg/dL (ref 0.44–1.00)
GFR calc Af Amer: >60 mL/min (ref >60)
GFR calc non Af Amer: >60 mL/min (ref >60)
Glucose, Bld: 93 mg/dL (ref 70–99)
Phosphorus: 3 mg/dL (ref 2.5–4.6)
Potassium: 3.7 mmol/L (ref 3.5–5.1)
Sodium: 144 mmol/L (ref 135–145)

## 2018-09-20 LAB — MAGNESIUM: Magnesium: 1.9 mg/dL (ref 1.7–2.4)

## 2018-09-20 NOTE — Progress Notes (Signed)
[facility])  This is a related and covered GIP admission of 09/18/2018 with a [facility] Diagnosis ofCerebral atherosclerosisper Dr. Montez Moritaarter. Patient is DNR. [facility] RN observed that patient was less mobile than usual with pain in right hip/leg area when she attempted movement. No witnessed falls were reported.  Visited pt at the bedside, pt asleep, no family present.  Report from RN states that pt rests well when asleep, but when awake is in pain, confused and combative, attempting to strike staff.  She is in extreme pain with slightest movement, however subsides immediately and the pt returns to sleep.   V/S:  98.5 axillary, 142/115, 92, RR 16, SPO2 97 on room air.    2 View from yesterday, showed continued pneumothorax, no shift and no evidence of tension, improvement from previous study.  Discussion surrounding chest tube, however with maintained O2 saturations and no s/s of respiratory distress the decision was made yesterday to not place.   Plan:  D/C back to facility with [facility] services in place.  Manage pain from femoral head fracture.  Attending MD has not made decision on discharging.  Tentative plan yesterday was "1-2 days".    IDT:  Updated in [employer]  [facility] with [facility]:  [facility]  Please call with any hospice related questions or concerns, Wallis BambergJennifer Woody BSN, RN [facility] Liaison ([place]) [phone]

## 2018-09-20 NOTE — Progress Notes (Signed)
OT Cancellation Note  Patient Details Name: Isabella [name] MRN: [id number] DOB: 04/16/1921    Cancelled Treatment:    Reason Eval/Treat Not Completed: Other (comment)  Pt screened, no OT needs identified, will sign off; noted pt with history of dementia on Hospice care at facility.  Now with displaced femoral neck fracture and caregivers have opted for non-operative treatment with MD suggesting NWB and bedrest.    [name], [name], OT Acute Rehabilitation Services [phone] Office- [phone]    09/20/2018, 4:17 PM

## 2018-09-20 NOTE — Progress Notes (Signed)
PROGRESS NOTE    Isabella SevinSarah [name]  RUE:[id number] DOB: 10/15/1920 DOA: 09/18/2018 PCP: Mortimer Friesurl, David, PA  Outpatient Specialists:   Brief Narrative:  Isabella [name] is a 84 year old female, currently on hospice, with past medical history significant for vascular dementia and history of psychosis in the elderly, recurrent spontaneous pneumothorax on the right which is improving, hyponatremia, seizure disorder, hypertension and constipation.  Patient is a resident at the skilled nursing facility.  Patient was admitted following several falls.  Work-up revealed right non-displaced right hip fracture.  Orthopedic input is appreciated.  The hospice team input is appreciated.  After extensive discussion, the plan for now is to proceed with conservative approach.  Patient seen today.  Pain is well controlled, as long as patient is not mobilized.  Will discharge in the next 1 to 2 days.  09/20/2018: Pain is better controlled.  Likely DC back to the skilled nursing facility in a.m.  No history from patient.  Assessment & Plan:   Active Problems:   Seizures (HCC)   Pneumothorax on right   Hypernatremia   Essential hypertension, benign   Vascular dementia without behavioral disturbance (HCC)   Slow transit constipation   Recurrent spontaneous pneumothorax   Hip fracture (HCC)   Femoral neck fracture (HCC)   Likely nondisplaced right femoral neck fracture from probable fall -Admit to patient MedSurg -Pain control -Hip x-ray showed an old left inferior pubic ramus and ischial fractures and a probable nondisplaced right femoral neck fracture which is acute -Will defer to orthopedic surgery to obtain a CT or MRI of the hip to confirm fracture -Orthopedic surgery consulted for further evaluation and patient may undergo hip pinning for palliation but will have orthopedic surgery fully assess and make recommendations -Currently n.p.o. for possible intervention -Hospice team to follow -Head CT done because of  Fall and showed "Chronic atrophic and ischemic changes stable from the prior exam. No acute abnormality noted." -09/20/2018: For conservative approach for now.  To manage pain control.  Likely discharge in 1 to 2 days.  Chronic Right pneumothorax -Is chronic -Is improved from prior study -I discussed the case with Dr. Wynona Neatlalere reviewed the films as well and states that intubation would not affect her pneumothorax is very small -Repeat chest x-ray in the a.m. -Currently does not need a chest tube as she is not in any respiratory distress and saturating 96% on room air 09/19/2018: Stable.  Seizure Disorder -Continue with levetiracetam 500 mg p.o. every morning and 250 mg p.o. every evening  Vascular dementia history of behavioral disturbances -Continue with lorazepam 0.25 mg daily PRN for anxiety, mirtazapine 7.5 mg p.o. nightly, haloperidol 0.25 mg 4 times a day, and risperidone 0.25 mg p.o. nightly  Hypertension -Currently holding her home Lasix 20 mg Monday Wednesday Friday -Patient was 146/94 -If necessary will place on IV hydralazine but for now we will continue with pain control 09/19/2018: Continue to optimize.  Constipation -Is slow transit -Continue home regimen with bisacodyl suppository 10 mg daily PRN for moderate constipation, MiraLAX 17 g p.o. daily, senna docusate 1 tab p.o. nightly  Hypernatremia -Mild 146 -Was given IV fluids in the ED with D5 half-normal saline 1 L and will place on D5 half-normal saline at a rate of 50 mils per hour for 10 hours -Keep n.p.o. at this time for possible operative management for palliation 09/19/2018: Sodium is 145 today.  Potassium is 3.4.  Replete potassium.  Liberalize free water. 09/20/2018: Resolved.  DVT prophylaxis: Subcu heparin. Code Status: Not  resuscitate Family Communication:  Disposition Plan: Likely DC in 1 to 2 days.   Consultants:   Orthopedic  Hospitalist  Procedures:   None  Antimicrobials:    None   Subjective: Poor historian. Not in painful distress.  Objective: Vitals:   09/19/18 2039 09/20/18 0433 09/20/18 0500 09/20/18 1233  BP: (!) 175/86 (!) 165/91  (!) 142/115  Pulse: 80 80  92  Resp: 18 16  16   Temp: 97.7 F (36.5 C) 97.8 F (36.6 C)  98.5 F (36.9 C)  TempSrc: Oral Oral  Axillary  SpO2: 99% 97%  97%  Weight:   46.5 kg    No intake or output data in the 24 hours ending 09/20/18 1825 Filed Weights   09/19/18 0500 09/20/18 0500  Weight: 49 kg 46.5 kg    Examination:  General exam: Appears calm and comfortable  Respiratory system: Clear to auscultation. Respiratory effort normal. Cardiovascular system: S1 & S2 heard. No pedal edema. Gastrointestinal system: Abdomen is nondistended, soft and nontender. No organomegaly or masses felt. Normal bowel sounds heard. Central nervous system: Awake and alert.    Data Reviewed: I have personally reviewed following labs and imaging studies  CBC: Recent Labs  Lab 09/18/18 1017 09/19/18 0609  WBC 6.3 4.4  NEUTROABS 4.3  --   HGB 12.4 11.0*  HCT 39.3 35.1*  MCV 95.2 95.9  PLT 288 241   Basic Metabolic Panel: Recent Labs  Lab 09/18/18 1017 09/19/18 0609 09/20/18 0618  NA 146* 145 144  K 3.6 3.4* 3.7  CL 110 112* 110  CO2 26 24 24   GLUCOSE 97 89 93  BUN 17 12 12   CREATININE 0.61 0.67 0.59  CALCIUM 9.0 8.7* 8.8*  MG 2.0  --  1.9  PHOS 3.2  --  3.0   GFR: Estimated Creatinine Clearance: 30.2 mL/min (by C-G formula based on SCr of 0.59 mg/dL). Liver Function Tests: Recent Labs  Lab 09/18/18 1017 09/19/18 0609 09/20/18 0618  AST 18 26  --   ALT 19 22  --   ALKPHOS 75 63  --   BILITOT 0.8 1.1  --   PROT 7.1 5.9*  --   ALBUMIN 3.2* 2.7* 2.8*   No results for input(s): LIPASE, AMYLASE in the last 168 hours. No results for input(s): AMMONIA in the last 168 hours. Coagulation Profile: Recent Labs  Lab 09/18/18 1017  INR 1.08   Cardiac Enzymes: No results for input(s): CKTOTAL, CKMB,  CKMBINDEX, TROPONINI in the last 168 hours. BNP (last 3 results) No results for input(s): PROBNP in the last 8760 hours. HbA1C: No results for input(s): HGBA1C in the last 72 hours. CBG: Recent Labs  Lab 09/19/18 0638 09/19/18 0811  GLUCAP 80 82   Lipid Profile: No results for input(s): CHOL, HDL, LDLCALC, TRIG, CHOLHDL, LDLDIRECT in the last 72 hours. Thyroid Function Tests: No results for input(s): TSH, T4TOTAL, FREET4, T3FREE, THYROIDAB in the last 72 hours. Anemia Panel: No results for input(s): VITAMINB12, FOLATE, FERRITIN, TIBC, IRON, RETICCTPCT in the last 72 hours. Urine analysis:    Component Value Date/Time   COLORURINE YELLOW 06/24/2018 0452   APPEARANCEUR CLEAR 06/24/2018 0452   LABSPEC 1.020 06/24/2018 0452   PHURINE 6.0 06/24/2018 0452   GLUCOSEU NEGATIVE 06/24/2018 0452   HGBUR NEGATIVE 06/24/2018 0452   BILIRUBINUR NEGATIVE 06/24/2018 0452   KETONESUR NEGATIVE 06/24/2018 0452   PROTEINUR NEGATIVE 06/24/2018 0452   UROBILINOGEN 0.2 04/30/2015 2118   NITRITE NEGATIVE 06/24/2018 0452   LEUKOCYTESUR NEGATIVE 06/24/2018  4034   Sepsis Labs: @LABRCNTIP (procalcitonin:4,lacticidven:4)  ) Recent Results (from the past 240 hour(s))  MRSA PCR Screening     Status: None   Collection Time: 09/18/18  3:48 PM  Result Value Ref Range Status   MRSA by PCR NEGATIVE NEGATIVE Final    Comment:        The GeneXpert MRSA Assay (FDA approved for NASAL specimens only), is one component of a comprehensive MRSA colonization surveillance program. It is not intended to diagnose MRSA infection nor to guide or monitor treatment for MRSA infections. Performed at [facility], [address], [place], [place] [address]          Radiology Studies: X-ray Chest Pa And Lateral  Result Date: 09/19/2018 CLINICAL DATA:  Shortness of breath, history hypertension, dementia, seizures EXAM: CHEST - 2 VIEW COMPARISON:  09/18/2018 FINDINGS: Borderline enlargement  of cardiac silhouette. Atherosclerotic calcification aorta. RIGHT hydropneumothorax with moderate RIGHT pleural effusion and interval increase in size of a RIGHT pneumothorax since previous exam, estimated 10-15%. No mediastinal shift. LEFT lung clear. No LEFT pleural effusion or pneumothorax. Bones demineralized. IMPRESSION: RIGHT hydropneumothorax with slight increase in both the hydrothorax and pneumothorax component since the previous exam. Electronically Signed   By: Ulyses Southward M.D.   On: 09/19/2018 13:14        Scheduled Meds: . acetaminophen  500 mg Oral BID  . haloperidol  0.25 mg Oral QID  . levETIRAcetam  250 mg Oral QPM  . levETIRAcetam  500 mg Oral q morning - 10a  . magnesium oxide  400 mg Oral Daily  . mouth rinse  15 mL Mouth Rinse BID  . mirtazapine  7.5 mg Oral QHS  . polyethylene glycol  17 g Oral Daily  . polyvinyl alcohol  1 drop Both Eyes BID  . risperiDONE  0.25 mg Oral QHS  . senna-docusate  1 tablet Oral QHS   Continuous Infusions:   LOS: 2 days    Time spent: 25 minutes.  Berton Mount, MD  [facility] Pager #: [phone] 7PM-7AM contact night coverage as above

## 2018-09-21 LAB — RENAL FUNCTION PANEL
Albumin: 2.9 g/dL — ABNORMAL LOW (ref 3.5–5.0)
Anion gap: 11 (ref 5–15)
BUN: 13 mg/dL (ref 8–23)
CO2: 25 mmol/L (ref 22–32)
Calcium: 8.9 mg/dL (ref 8.9–10.3)
Chloride: 106 mmol/L (ref 98–111)
Creatinine, Ser: 0.68 mg/dL (ref 0.44–1.00)
GFR calc Af Amer: >60 mL/min (ref >60)
GFR calc non Af Amer: >60 mL/min (ref >60)
Glucose, Bld: 91 mg/dL (ref 70–99)
Phosphorus: 3.1 mg/dL (ref 2.5–4.6)
Potassium: 3.8 mmol/L (ref 3.5–5.1)
Sodium: 142 mmol/L (ref 135–145)

## 2018-09-21 LAB — GLUCOSE, CAPILLARY
Glucose-Capillary: 87 mg/dL (ref 70–99)
Glucose-Capillary: 84 mg/dL (ref 70–99)
Glucose-Capillary: 82 mg/dL (ref 70–99)

## 2018-09-21 MED ORDER — OXYCODONE HCL 5 MG PO TABS: 5.0000 mg | ORAL_TABLET | ORAL | 0 refills | Status: AC | PRN

## 2018-09-21 NOTE — Clinical Social Work Placement (Addendum)
   4:06 PM Patient to return [facility].  LCSW confirmed return with facility.   LCSW faxed dc docs to facility.   [facility]. Please call when patient is ready to transport [phone], chose option 1, then 3, no emergency  Family notified.   RN report number: [phone]  BKJ   CLINICAL SOCIAL WORK PLACEMENT  NOTE  Date:  09/21/2018  Patient Details  Name: Isabella SevinSarah Naab MRN: [id number] Date of Birth: 02/23/1921  Clinical Social Work is seeking post-discharge placement for this patient at the Assisted Living Facility level of care (*CSW will initial, date and re-position this form in  chart as items are completed):      Patient/family provided with [facility] Clinical Social Work Department's list of facilities offering this level of care within the geographic area requested by the patient (or if unable, by the patient's family).      Patient/family informed of their freedom to choose among providers that offer the needed level of care, that participate in Medicare, Medicaid or managed care program needed by the patient, have an available bed and are willing to accept the patient.      Patient/family informed of [facility]'s ownership interest in [facility] and [facility], as well as of the fact that they are under no obligation to receive care at these facilities.  PASRR submitted to EDS on       PASRR number received on       Existing PASRR number confirmed on       FL2 transmitted to all facilities in geographic area requested by pt/family on       FL2 transmitted to all facilities within larger geographic area on       Patient informed that his/her managed care company has contracts with or will negotiate with certain facilities, including the following:            Patient/family informed of bed offers received.  Patient chooses bed at       Physician recommends and patient chooses bed at      Patient to be  transferred to   on 09/21/18.  Patient to be transferred to facility by EMS     Patient family notified on 09/21/18 of transfer.  Name of family member notified:  [name]     PHYSICIAN Please sign FL2     Additional Comment:    _______________________________________________ Coralyn HellingBernette Dacoda Spallone, LCSW 09/21/2018, 4:06 PM

## 2018-09-21 NOTE — NC FL2 (Signed)
[place] MEDICAID FL2 LEVEL OF CARE SCREENING TOOL     IDENTIFICATION  Patient Name: Isabella SevinSarah Guttman Birthdate: 08/25/1921 Sex: female Admission Date (Current Location): 09/18/2018  [facility] [place]   Facility and Address:  [facility],  [address], [place] [id number]      Provider Number: [id number]  Attending Physician Name and Address:  Barnetta Chapelgbata, Sylvester I, MD  Relative Name and Phone Number:       Current Level of Care: Hospital Recommended Level of Care: Assisted Living Facility Prior Approval Number:    Date Approved/Denied:   PASRR Number: [id number]  Discharge Plan: Other (Comment)(ALF)    Current Diagnoses: Patient Active Problem List   Diagnosis Date Noted  . Hip fracture (HCC) 09/18/2018  . Femoral neck fracture (HCC) 09/18/2018  . Protein-calorie malnutrition, severe 07/02/2018  . Acute metabolic encephalopathy 06/24/2018  . Recurrent spontaneous pneumothorax   . Essential hypertension, benign 11/10/2017  . Vascular dementia without behavioral disturbance (HCC) 11/10/2017  . Slow transit constipation 11/10/2017  . Hypokalemia 11/10/2017  . Hypomagnesemia 11/10/2017  . Chronic generalized pain disorder 11/10/2017  . Psychosis in elderly (HCC) 11/10/2017  . Encounter for chest tube placement   . Acute respiratory failure (HCC)   . Pneumothorax on right 11/02/2017  . Hypernatremia 11/02/2017  . CAP (community acquired pneumonia) 09/29/2016  . Seizures (HCC) 09/27/2016    Orientation RESPIRATION BLADDER Height & Weight     (disoriented)  Normal Incontinent Weight: 99 lb 13.9 oz (45.3 kg) Height:     BEHAVIORAL SYMPTOMS/MOOD NEUROLOGICAL BOWEL NUTRITION STATUS      Incontinent Diet(See dc summary)  AMBULATORY STATUS COMMUNICATION OF NEEDS Skin   Extensive Assist Verbally Normal                       Personal Care Assistance Level of Assistance  Bathing, Feeding, Dressing Bathing Assistance: Limited  assistance Feeding assistance: Limited assistance Dressing Assistance: Limited assistance     Functional Limitations Info  Sight, Hearing, Speech Sight Info: Adequate Hearing Info: Adequate Speech Info: Adequate    SPECIAL CARE FACTORS FREQUENCY                       Contractures Contractures Info: Not present    Additional Factors Info  Code Status, Allergies Code Status Info: DNR Allergies Info: NKA           Current Medications (09/21/2018):  This is the current hospital active medication list Current Facility-Administered Medications  Medication Dose Route Frequency Provider Last Rate Last Dose  . acetaminophen (TYLENOL) tablet 650 mg  650 mg Oral Q6H PRN Marguerita MerlesSheikh, Omair Latif, DO       Or  . acetaminophen (TYLENOL) suppository 650 mg  650 mg Rectal Q6H PRN Marguerita MerlesSheikh, Omair Latif, DO      . acetaminophen (TYLENOL) tablet 500 mg  500 mg Oral BID Marguerita MerlesSheikh, Omair ViloniaLatif, DO   500 mg at 09/21/18 [id number]  . bisacodyl (DULCOLAX) suppository 10 mg  10 mg Rectal Daily PRN Marguerita MerlesSheikh, Omair Latif, DO      . haloperidol (HALDOL) tablet 0.25 mg  0.25 mg Oral QID Sheikh, Omair Latif, DO   0.25 mg at 09/20/18 1057  . levETIRAcetam (KEPPRA) 100 MG/ML solution 250 mg  250 mg Oral QPM SheikhKateri Mc, Omair [place]if, DO   250 mg at 09/20/18 1704  . levETIRAcetam (KEPPRA) 100 MG/ML solution 500 mg  500 mg Oral q morning - 10a Sheikh,  Heloise Beechammair Latif, DO   500 mg at 09/21/18 0937  . loperamide (IMODIUM) capsule 2 mg  2 mg Oral QID PRN Marguerita MerlesSheikh, Omair Latif, DO      . LORazepam (ATIVAN) tablet 0.25 mg  0.25 mg Oral Daily PRN Marguerita MerlesSheikh, Omair Latif, DO      . magnesium oxide (MAG-OX) tablet 400 mg  400 mg Oral Daily Marguerita MerlesSheikh, Omair BonneyLatif, DO   400 mg at 09/21/18 [id number]  . MEDLINE mouth rinse  15 mL Mouth Rinse BID Marguerita MerlesSheikh, Omair [employer]Latif, DO   15 mL at 09/21/18 1408  . mirtazapine (REMERON) tablet 7.5 mg  7.5 mg Oral QHS Marguerita MerlesSheikh, Omair [place], DO   7.5 mg at 09/20/18 2136  . ondansetron (ZOFRAN) tablet 4 mg  4 mg Oral Q6H PRN  Marguerita MerlesSheikh, Omair Latif, DO       Or  . ondansetron [facility](ZOFRAN) injection 4 mg  4 mg Intravenous Q6H PRN Marguerita MerlesSheikh, Omair Latif, DO      . oxyCODONE (Oxy IR/ROXICODONE) immediate release tablet 5 mg  5 mg Oral Q4H PRN Sheikh, Omair Latif, DO      . polyethylene glycol (MIRALAX / GLYCOLAX) packet 17 g  17 g Oral Daily Sheikh, Omair Latif, DO   17 g at 09/20/18 1057  . polyvinyl alcohol (LIQUIFILM TEARS) 1.4 % ophthalmic solution 1 drop  1 drop Both Eyes BID Marguerita MerlesSheikh, Omair [place]if, DO   1 drop at 09/21/18 0941  . risperiDONE (RISPERDAL) tablet 0.25 mg  0.25 mg Oral QHS Marguerita MerlesSheikh, Omair [place]if, DO   0.25 mg at 09/20/18 2137  . senna-docusate (Senokot-S) tablet 1 tablet  1 tablet Oral QHS Marguerita MerlesSheikh, Omair [place]if, [place]   1 tablet at 09/20/18 2136     Discharge Medications: Please see discharge summary for a list of discharge medications.  Relevant Imaging Results:  Relevant Lab Results:   Additional Information SSN: [id number]  Coralyn HellingBernette Lashanna Angelo, LCSW

## 2018-09-21 NOTE — Progress Notes (Signed)
[facility].  Facility passed on that they have been having problems with patient's coming from hospital without fl2 being signed by MD.  Facility requested that this be taken care of prior to transport.  Notified Dr. Dartha Lodgegbata.

## 2018-09-21 NOTE — Discharge Summary (Signed)
Physician Discharge Summary  Patient ID: Isabella SevinSarah [name] MRN: [id number] DOB/AGE: 82/09/1920 82 y.o.  Admit date: 09/18/2018 Discharge date: 09/21/2018  Admission Diagnoses:  Discharge Diagnoses:  Active Problems:   Seizures (HCC)   Pneumothorax on right   Hypernatremia   Essential hypertension, benign   Vascular dementia without behavioral disturbance (HCC)   Slow transit constipation   Recurrent spontaneous pneumothorax   Hip fracture (HCC)   Femoral neck fracture [facility](HCC)   Discharged Condition: stable  Hospital Course: Isabella SituSarah [name] a 84 year old female, currently under hospice,with past medical history significant for vascular dementia and history of psychosis in the elderly, recurrent spontaneous pneumothorax on the right which is improving, hyponatremia, seizure disorder, hypertension and constipation.  Patient is a resident at the skilled nursing facility.  Patient was admitted following several falls.  Work-up revealed right non-displaced right hip fracture.  Orthopedic input is appreciated.  The hospice team input is appreciated.  After extensive discussion, the plan is to proceed with conservative approach.  Patient remains comfortable, and does not appear to be in any painful distress.  Patient will be discharged back to the skilled nursing facility.  The hospice team will continue to follow patient on discharge.  Continue to optimize patient's pain.    Likely nondisplaced right femoral neck fracturefrom probable fall -Pain control -Hip x-ray showed an old left inferior pubic ramus and ischial fractures and a probable nondisplaced right femoral neck fracture which is acute -Orthopedic surgery consulted for further assessment and management -Hospice team to follow -Head CT done because of Fall and showed "Chronic atrophic and ischemic changes stable from the prior exam. No acute abnormality noted." For conservative approach  ChronicRight pneumothorax -Chronic -Currently  does not need a chest tube as she is not in any respiratory distress and saturating 96% on room air  SeizureDisorder -Continue with levetiracetam 500 mg p.o. every morning and 250 mg p.o. every evening  Vascular dementia history of behavioral disturbances -Continue with lorazepam 0.25 mg daily PRN for anxiety, mirtazapine 7.5 mg p.o. nightly, haloperidol 0.25 mg 4 times a day, and risperidone 0.25 mg p.o. nightly  Hypertension -Currently holding her home Lasix 20 mg Monday Wednesday Friday -Continue to optimize.  Constipation -Continue home regimen with bisacodyl suppository 10 mg daily PRN for moderate constipation, MiraLAX 17 g p.o. daily, senna docusate 1 tab p.o. nightly  Hypernatremia -Mild 146 -Was given IV fluids inthe ED, and maintained on D5W.  Consults: orthopedic surgery and Hospice  Significant Diagnostic Studies:   Discharge Exam: Blood pressure (!) 134/98, pulse 91, temperature 98 F (36.7 C), temperature source Oral, resp. rate 16, weight 45.3 kg, SpO2 99 %.   Disposition: Discharge disposition: 03-Skilled Nursing Facility   Discharge Instructions    Diet - low sodium heart healthy   Complete by:  As directed    Increase activity slowly   Complete by:  As directed      Allergies as of 09/21/2018   No Known Allergies     Medication List    STOP taking these medications   furosemide 20 MG tablet Commonly known as:  LASIX   potassium chloride SA 20 MEQ tablet Commonly known as:  K-DUR,KLOR-CON     TAKE these medications   acetaminophen 500 MG tablet Commonly known as:  TYLENOL Take 1 tablet (500 mg total) by mouth 2 (two) times daily.   bisacodyl 10 MG suppository Commonly known as:  DULCOLAX Place 1 suppository (10 mg total) rectally daily as needed for moderate constipation.  haloperidol 0.5 MG tablet Commonly known as:  HALDOL Take 0.25 mg by mouth 4 (four) times daily.   levETIRAcetam 100 MG/ML solution Commonly known as:   KEPPRA Take 250 mg by mouth every evening. What changed:  Another medication with the same name was removed. Continue taking this medication, and follow the directions you see here.   levETIRAcetam 100 MG/ML solution Commonly known as:  KEPPRA Take 500 mg by mouth every morning. What changed:  Another medication with the same name was removed. Continue taking this medication, and follow the directions you see here.   loperamide 2 MG tablet Commonly known as:  IMODIUM A-D Take 2 mg by mouth 4 (four) times daily as needed for diarrhea or loose stools.   LORazepam 0.5 MG tablet Commonly known as:  ATIVAN Take 0.25 mg by mouth daily as needed for anxiety.   magnesium oxide 400 (241.3 Mg) MG tablet Commonly known as:  MAG-OX Take 1 tablet (400 mg total) by mouth daily.   Melatonin 3 MG Caps Take 3 mg by mouth at bedtime.   mirtazapine 15 MG tablet Commonly known as:  REMERON Take 7.5 mg by mouth at bedtime.   oxyCODONE 5 MG immediate release tablet Commonly known as:  Oxy IR/ROXICODONE Take 1 tablet (5 mg total) by mouth every 4 (four) hours as needed for up to 5 days for moderate pain.   polyethylene glycol packet Commonly known as:  MIRALAX / GLYCOLAX Take 17 g by mouth daily.   risperiDONE 0.25 MG tablet Commonly known as:  RISPERDAL Take 0.25 mg by mouth at bedtime.   senna-docusate 8.6-50 MG tablet Commonly known as:  Senokot-S Take 1 tablet by mouth at bedtime. What changed:  when to take this   SYSTANE 0.4-0.3 % Soln Generic drug:  Polyethyl Glycol-Propyl Glycol Place 1 drop into both eyes 2 (two) times daily.        SignedBarnetta Chapel: Cathan Gearin I Shykeem Resurreccion 09/21/2018, 3:47 PM

## 2018-09-21 NOTE — Progress Notes (Signed)
LCSW consulted for dc planning.  LCSW will continue to follow for disposition.   Beulah GandyBernette Denisia Harpole, LSCW [facility] CSW [phone]

## 2018-09-21 NOTE — Clinical Social Work Note (Signed)
Clinical Social Work Assessment  Patient Details  Name: Isabella SevinSarah [name] MRN: [id number] Date of Birth: 03/13/1921  Date of referral:  09/21/18               Reason for consult:  Facility Placement, Discharge Planning                Permission sought to share information with:  Family Supports Permission granted to share information::  (Patient not oriented)  Name::     Isabella SpragueBeverly  Agency[facility]  [facility]  Relationship::  daughter  Contact Information:     Housing/Transportation Living arrangements for the past 2 months:  [facility] Source of Information:  Medical Team(Chart review) Patient Interpreter Needed:  None Criminal Activity/Legal Involvement Pertinent to Current Situation/Hospitalization:  No - Comment as needed Significant Relationships:  Adult Children, Merchandiser, retailCommunity Support Lives with:  Facility Resident Do you feel safe going back to the place where you live?    Need for family participation in patient care:     Care giving concerns:  Isabella SevinSarah Spaziani is a 84 y.o. 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    Social Worker assessment / plan:  LCSW following for disposition.   Patient most recently admitted and assessed 06/25/18. No significant change since last assessment.  [facility] with [facility] following.   According to notes patient is generally more oriented at baseline.  [facility]. According to previous assessment patient ambulates independently and needs minimal assistance with ADLs.   Per progress not patient tentatively dc  to facility with hospice when stable.    Employment status:  Retired Health and safety inspectornsurance information:  Other (Comment Required)([facility]) PT Recommendations:  Not assessed at this time Information / Referral to community resources:     Patient/Family's Response to care:  Unable to assesses.   Patient/Family's Understanding of and Emotional Response to Diagnosis, Current Treatment, and Prognosis:  Patient is unable to participate in assessment. LCSW attempted to reach daughter by phone for collateral.   Emotional Assessment Appearance:  Appears stated age Attitude/Demeanor/Rapport:    Affect (typically observed):  Unable to Assess Orientation:  Oriented to Self Alcohol / Substance use:  Not Applicable Psych involvement (Current and /or in the community):  No (Comment)  Discharge Needs  Concerns to be addressed:  No discharge needs identified Readmission within the last 30 days:  No Current discharge risk:  None Barriers to Discharge:  No Barriers Identified   Coralyn HellingBernette Jenavieve Freda, LCSW 09/21/2018, 10:35 AM

## 2018-09-22 LAB — RENAL FUNCTION PANEL
Albumin: 2.8 g/dL — ABNORMAL LOW (ref 3.5–5.0)
Anion gap: 8 (ref 5–15)
BUN: 17 mg/dL (ref 8–23)
CO2: 26 mmol/L (ref 22–32)
Calcium: 8.7 mg/dL — ABNORMAL LOW (ref 8.9–10.3)
Chloride: 107 mmol/L (ref 98–111)
Creatinine, Ser: 0.57 mg/dL (ref 0.44–1.00)
GFR calc Af Amer: >60 mL/min (ref >60)
GFR calc non Af Amer: >60 mL/min (ref >60)
Glucose, Bld: 96 mg/dL (ref 70–99)
Phosphorus: 3 mg/dL (ref 2.5–4.6)
Potassium: 3.7 mmol/L (ref 3.5–5.1)
Sodium: 141 mmol/L (ref 135–145)

## 2018-09-22 LAB — GLUCOSE, CAPILLARY: Glucose-Capillary: 85 mg/dL (ref 70–99)

## 2018-09-22 NOTE — Progress Notes (Signed)
CSW assisting with patient discharge [facility].  CSW reached out to Audiological scientistDirector Resident Coordinator Crystal, she reports she could not find paperwork that was faxed over yesterday from hospital and requested information be faxed again. CSW faxed information.  [facility]  Zionna Homewood, LCSW, MSW Clinical Social Worker  [phone] 09/22/2018  9:11 AM

## 2018-09-22 NOTE — Care Management Note (Signed)
Case Management Note  Patient Details  Name: Rudolpho SevinSarah Enoch MRN: [id number] Date of Birth: 03/01/1921  Subjective/Objective:                  dischareged  Action/Plan: Transferred to [facility]  Expected Discharge Date:  09/21/18               Expected Discharge Plan:  Skilled Nursing Facility  In-House Referral:  Clinical Social Work  Discharge planning Services  CM Consult  Post Acute Care Choice:    Choice offered to:     DME Arranged:    DME Agency:     HH Arranged:    HH Agency:     Status of Service:  Completed, signed off  If discussed at [employer] of [employer] Meetings, dates discussed:    Additional Comments:  Golda AcreDavis, Rashee Marschall Lynn, RN 09/22/2018, 9:02 AM

## 2018-09-22 NOTE — Progress Notes (Signed)
Report given to Garnerrystal, [facility]. Patient transferred via [facility].

## 2018-09-22 NOTE — Progress Notes (Signed)
Scheduled 12/23 discharge to [facility] rescheduled for 12/24 secondary lack of transport.

## 2018-09-22 NOTE — Progress Notes (Addendum)
[facility] GIP RN visit.   This is a related and covered GIP admission of 09/18/2018 with a [facility] Diagnosis of Cerebral atherosclerosis per Dr. Montez Moritaarter. Patient is DNR.  [facility] RN observed that patient was less mobile than usual with pain in right hip/leg area when she attempted movement. No witnessed falls were reported.   Visited patient at bedside. No family or visitors present. Spoke with staff who states patient will discharge back to ALF today.  Per MD notes on 09/20/18:  -After extensive discussion, the plan for now is to proceed with conservative approach.  Patient seen today.  Pain is well controlled, as long as patient is not mobilized.  Will discharge in the next 1 to 2 days.  Goals of Care:  DNR. Palliative care for comfort is the goal.  Discharge planning: Return to ALF with hospice support.  Please call with any hospice related questions or concerns. Please use [facility] if ambulance transport is needed at time of discharge.  Communication with [facility]: Left voicemail message for daughter  Communication with [facility]: Team notified of planned discharge.  Thank you,  Elsie SaasMary Anne Robertson, RN. [facility] Hospital Liaison (listed on Monette[place]ion) [phone]

## 2018-09-29 ENCOUNTER — Emergency Department (HOSPITAL_COMMUNITY)

## 2018-09-29 ENCOUNTER — Encounter (HOSPITAL_COMMUNITY): Payer: Self-pay

## 2018-09-29 ENCOUNTER — Inpatient Hospital Stay (HOSPITAL_COMMUNITY)
Admission: EM | Admit: 2018-09-29 | Discharge: 2018-10-07 | DRG: 200 | Disposition: A | Discharge status: Hospice | Attending: Internal Medicine | Admitting: Internal Medicine

## 2018-09-29 ENCOUNTER — Other Ambulatory Visit: Payer: Self-pay

## 2018-09-29 DIAGNOSIS — G40909 Epilepsy, unspecified, not intractable, without status epilepticus: Secondary | ICD-10-CM | POA: Diagnosis present

## 2018-09-29 DIAGNOSIS — E870 Hyperosmolality and hypernatremia: Secondary | ICD-10-CM | POA: Diagnosis present

## 2018-09-29 DIAGNOSIS — Z79891 Long term (current) use of opiate analgesic: Secondary | ICD-10-CM | POA: Diagnosis not present

## 2018-09-29 DIAGNOSIS — F419 Anxiety disorder, unspecified: Secondary | ICD-10-CM | POA: Diagnosis present

## 2018-09-29 DIAGNOSIS — Z681 Body mass index (BMI) 19 or less, adult: Secondary | ICD-10-CM

## 2018-09-29 DIAGNOSIS — F0151 Vascular dementia with behavioral disturbance: Secondary | ICD-10-CM | POA: Diagnosis not present

## 2018-09-29 DIAGNOSIS — R569 Unspecified convulsions: Secondary | ICD-10-CM

## 2018-09-29 DIAGNOSIS — Z66 Do not resuscitate: Secondary | ICD-10-CM | POA: Diagnosis present

## 2018-09-29 DIAGNOSIS — R509 Fever, unspecified: Secondary | ICD-10-CM | POA: Diagnosis present

## 2018-09-29 DIAGNOSIS — J930 Spontaneous tension pneumothorax: Principal | ICD-10-CM | POA: Diagnosis present

## 2018-09-29 DIAGNOSIS — R64 Cachexia: Secondary | ICD-10-CM | POA: Diagnosis present

## 2018-09-29 DIAGNOSIS — Z79890 Hormone replacement therapy (postmenopausal): Secondary | ICD-10-CM

## 2018-09-29 DIAGNOSIS — H9190 Unspecified hearing loss, unspecified ear: Secondary | ICD-10-CM | POA: Diagnosis present

## 2018-09-29 DIAGNOSIS — I672 Cerebral atherosclerosis: Secondary | ICD-10-CM | POA: Diagnosis present

## 2018-09-29 DIAGNOSIS — S72001D Fracture of unspecified part of neck of right femur, subsequent encounter for closed fracture with routine healing: Secondary | ICD-10-CM | POA: Diagnosis not present

## 2018-09-29 DIAGNOSIS — Z515 Encounter for palliative care: Secondary | ICD-10-CM | POA: Diagnosis present

## 2018-09-29 DIAGNOSIS — Z79899 Other long term (current) drug therapy: Secondary | ICD-10-CM

## 2018-09-29 DIAGNOSIS — F0390 Unspecified dementia without behavioral disturbance: Secondary | ICD-10-CM | POA: Diagnosis not present

## 2018-09-29 DIAGNOSIS — R000 Tachycardia, unspecified: Secondary | ICD-10-CM | POA: Diagnosis not present

## 2018-09-29 DIAGNOSIS — S72009A Fracture of unspecified part of neck of unspecified femur, initial encounter for closed fracture: Secondary | ICD-10-CM | POA: Diagnosis present

## 2018-09-29 DIAGNOSIS — E861 Hypovolemia: Secondary | ICD-10-CM | POA: Diagnosis present

## 2018-09-29 DIAGNOSIS — I10 Essential (primary) hypertension: Secondary | ICD-10-CM | POA: Diagnosis present

## 2018-09-29 DIAGNOSIS — F0150 Vascular dementia without behavioral disturbance: Secondary | ICD-10-CM | POA: Diagnosis present

## 2018-09-29 DIAGNOSIS — J939 Pneumothorax, unspecified: Secondary | ICD-10-CM

## 2018-09-29 DIAGNOSIS — Z7982 Long term (current) use of aspirin: Secondary | ICD-10-CM | POA: Diagnosis not present

## 2018-09-29 LAB — PROTIME-INR
INR: 1.18
Prothrombin Time: 14.9 seconds (ref 11.4–15.2)

## 2018-09-29 LAB — CBC WITH DIFFERENTIAL/PLATELET
Abs Immature Granulocytes: 0.05 10*3/uL (ref 0.00–0.07)
Basophils Absolute: 0 10*3/uL (ref 0.0–0.1)
Basophils Relative: 0 %
Eosinophils Absolute: 0.2 10*3/uL (ref 0.0–0.5)
Eosinophils Relative: 3 %
HCT: 41.5 % (ref 36.0–46.0)
Hemoglobin: 13.1 g/dL (ref 12.0–15.0)
IMMATURE GRANULOCYTES: 1 %
Lymphocytes Relative: 13 %
Lymphs Abs: 1.1 10*3/uL (ref 0.7–4.0)
MCH: 30.4 pg (ref 26.0–34.0)
MCHC: 31.6 g/dL (ref 30.0–36.0)
MCV: 96.3 fL (ref 80.0–100.0)
Monocytes Absolute: 0.6 10*3/uL (ref 0.1–1.0)
Monocytes Relative: 8 %
Neutro Abs: 6.2 10*3/uL (ref 1.7–7.7)
Neutrophils Relative %: 75 %
PLATELETS: 326 10*3/uL (ref 150–400)
RBC: 4.31 MIL/uL (ref 3.87–5.11)
RDW: 13.9 % (ref 11.5–15.5)
WBC: 8.2 10*3/uL (ref 4.0–10.5)
nRBC: 0 % (ref 0.0–0.2)

## 2018-09-29 LAB — URINALYSIS, ROUTINE W REFLEX MICROSCOPIC
Bilirubin Urine: NEGATIVE
Glucose, UA: NEGATIVE mg/dL
Ketones, ur: NEGATIVE mg/dL
Leukocytes, UA: NEGATIVE
Nitrite: NEGATIVE
Protein, ur: 30 mg/dL — AB
Specific Gravity, Urine: 1.025 (ref 1.005–1.030)
pH: 5 (ref 5.0–8.0)

## 2018-09-29 LAB — COMPREHENSIVE METABOLIC PANEL
ALT: 28 U/L (ref 0–44)
AST: 28 U/L (ref 15–41)
Albumin: 3 g/dL — ABNORMAL LOW (ref 3.5–5.0)
Alkaline Phosphatase: 100 U/L (ref 38–126)
Anion gap: 8 (ref 5–15)
BUN: 24 mg/dL — AB (ref 8–23)
CO2: 25 mmol/L (ref 22–32)
Calcium: 9.2 mg/dL (ref 8.9–10.3)
Chloride: 116 mmol/L — ABNORMAL HIGH (ref 98–111)
Creatinine, Ser: 0.67 mg/dL (ref 0.44–1.00)
GFR calc Af Amer: >60 mL/min (ref >60)
GFR calc non Af Amer: >60 mL/min (ref >60)
Glucose, Bld: 118 mg/dL — ABNORMAL HIGH (ref 70–99)
Potassium: 3.6 mmol/L (ref 3.5–5.1)
Sodium: 149 mmol/L — ABNORMAL HIGH (ref 135–145)
Total Bilirubin: 0.8 mg/dL (ref 0.3–1.2)
Total Protein: 7.5 g/dL (ref 6.5–8.1)

## 2018-09-29 LAB — I-STAT CG4 LACTIC ACID, ED: Lactic Acid, Venous: 1.48 mmol/L (ref 0.5–1.9)

## 2018-09-29 MED ORDER — ACETAMINOPHEN 325 MG PO TABS: 650.0000 mg | ORAL_TABLET | Freq: Four times a day (QID) | ORAL | Status: DC | PRN

## 2018-09-29 MED ORDER — ACETAMINOPHEN 650 MG RE SUPP
650.0000 mg | Freq: Once | RECTAL | Status: AC
  Administered 2018-09-29: 650 mg via RECTAL
  Filled 2018-09-29: qty 1

## 2018-09-29 MED ORDER — HALOPERIDOL LACTATE 2 MG/ML PO CONC
0.5000 mg | ORAL | Status: DC | PRN
  Filled 2018-09-29: qty 0.3

## 2018-09-29 MED ORDER — ACETAMINOPHEN 650 MG RE SUPP: 650.0000 mg | Freq: Four times a day (QID) | RECTAL | Status: DC | PRN

## 2018-09-29 MED ORDER — DEXTROSE-NACL 5-0.45 % IV SOLN
INTRAVENOUS | Status: DC
  Administered 2018-09-29 – 2018-10-03 (×4): via INTRAVENOUS

## 2018-09-29 MED ORDER — ENSURE ENLIVE PO LIQD: 237.0000 mL | Freq: Two times a day (BID) | ORAL | Status: DC

## 2018-09-29 MED ORDER — GLYCOPYRROLATE 0.2 MG/ML IJ SOLN: 0.2000 mg | INTRAMUSCULAR | Status: DC | PRN

## 2018-09-29 MED ORDER — HALOPERIDOL 0.5 MG PO TABS
0.5000 mg | ORAL_TABLET | ORAL | Status: DC | PRN
  Filled 2018-09-29: qty 1

## 2018-09-29 MED ORDER — POLYVINYL ALCOHOL 1.4 % OP SOLN
1.0000 [drp] | Freq: Four times a day (QID) | OPHTHALMIC | Status: DC | PRN
  Filled 2018-09-29: qty 15

## 2018-09-29 MED ORDER — LORAZEPAM 2 MG/ML IJ SOLN: 1.0000 mg | INTRAMUSCULAR | Status: DC | PRN

## 2018-09-29 MED ORDER — BISACODYL 10 MG RE SUPP: 10.0000 mg | Freq: Every day | RECTAL | Status: DC | PRN

## 2018-09-29 MED ORDER — ONDANSETRON 4 MG PO TBDP: 4.0000 mg | ORAL_TABLET | Freq: Four times a day (QID) | ORAL | Status: DC | PRN

## 2018-09-29 MED ORDER — MORPHINE SULFATE (PF) 2 MG/ML IV SOLN
1.0000 mg | INTRAVENOUS | Status: DC | PRN
  Administered 2018-09-29 – 2018-10-04 (×5): 2 mg via INTRAVENOUS
  Filled 2018-09-29 (×5): qty 1

## 2018-09-29 MED ORDER — POLYVINYL ALCOHOL 1.4 % OP SOLN
1.0000 [drp] | Freq: Two times a day (BID) | OPHTHALMIC | Status: DC
  Administered 2018-09-29 (×2): 1 [drp] via OPHTHALMIC
  Filled 2018-09-29 (×2): qty 15

## 2018-09-29 MED ORDER — BIOTENE DRY MOUTH MT LIQD: 15.0000 mL | OROMUCOSAL | Status: DC | PRN

## 2018-09-29 MED ORDER — DIPHENHYDRAMINE HCL 50 MG/ML IJ SOLN: 12.5000 mg | INTRAMUSCULAR | Status: DC | PRN

## 2018-09-29 MED ORDER — GLYCOPYRROLATE 1 MG PO TABS
1.0000 mg | ORAL_TABLET | ORAL | Status: DC | PRN
  Filled 2018-09-29: qty 1

## 2018-09-29 MED ORDER — HALOPERIDOL LACTATE 5 MG/ML IJ SOLN: 0.5000 mg | INTRAMUSCULAR | Status: DC | PRN

## 2018-09-29 MED ORDER — ONDANSETRON HCL 4 MG/2ML IJ SOLN: 4.0000 mg | Freq: Four times a day (QID) | INTRAMUSCULAR | Status: DC | PRN

## 2018-09-29 NOTE — ED Notes (Signed)
Spoke with daughter Meriam SpragueBeverly Graumann-daughter made aware patient has a "collapsed lung"-daughter does not wish Dr. Read DriversMolpus to place a chest tube at this time-daughter states "comfort measures only".

## 2018-09-29 NOTE — Progress Notes (Signed)
Pt arrived on unit with s/s of pain r/t transport. Medication given, pt now resting comfortably. No family present. Will continue to monitor.

## 2018-09-29 NOTE — ED Notes (Signed)
Dr. Read DriversMolpus in room to decompress right chest

## 2018-09-29 NOTE — ED Notes (Signed)
She has a female visitor, who has been here for almost two hours. Pt. Remains in no distress.

## 2018-09-29 NOTE — Plan of Care (Signed)
H&p, labwork and imaging reviewed from this am.  Ms. [name] is a 84 year old female under hospice care at facility admitted with fever and congestion and found to have recurrent pneumothorax. She underwent decompression in ED. Daughter does not want chest tube and elected to continue comfort care. Patient is lying comfortably in bed sleeping on my exam in no visible distress. Continue to monitor and supportive care to ensure comfort care  Laverna PeaceShayla D Nettey [facility]

## 2018-09-29 NOTE — ED Provider Notes (Signed)
[facility]-EMERGENCY DEPT Provider Note: Isabella DellJ. Lane Quintan Saldivar, MD, FACEP  CSN: [id number] MRN: [id number] ARRIVAL: 09/29/18 at 0158 ROOM: WA21/WA21   CHIEF COMPLAINT  Fever   HISTORY OF PRESENT ILLNESS  09/29/18 2:31 AM Isabella SevinSarah [name] is a 84 y.o. female hospice patient with advanced dementia who was sent from her living facility for a fever of 101.3.  She was noted to have a rattly cough.    She was admitted to the hospital on December 20 for a fall in which she had right femoral neck fracture.  Her family elected to follow a nonoperative treatment course.  Level 5 caveat: Dementia Past Medical History:  Diagnosis Date  . Dementia (HCC)   . Hearing loss   . Hypertension   . Seizures (HCC)   . Vascular dementia (HCC)   . Weight loss     History reviewed. No pertinent surgical history.  Family History  Family history unknown: Yes    Social History   Tobacco Use  . Smoking status: Never Smoker  . Smokeless tobacco: Never Used  Substance Use Topics  . Alcohol use: No  . Drug use: No    Prior to Admission medications   Medication Sig Start Date End Date Taking? Authorizing Provider  acetaminophen (TYLENOL) 500 MG tablet Take 1 tablet (500 mg total) by mouth 2 (two) times daily. 11/08/17  Yes Albertine GratesXu, Fang, MD  aspirin 81 MG chewable tablet Chew 81 mg by mouth daily.   Yes [provider]  haloperidol (HALDOL) 0.5 MG tablet Take 0.25 mg by mouth 4 (four) times daily.   Yes [provider]  levETIRAcetam (KEPPRA) 100 MG/ML solution Take 250 mg by mouth every evening.   Yes [provider]  levETIRAcetam (KEPPRA) 100 MG/ML solution Take 500 mg by mouth every morning.   Yes [provider]  magnesium oxide (MAG-OX) 400 (241.3 Mg) MG tablet Take 1 tablet (400 mg total) by mouth daily. 11/08/17  Yes Albertine GratesXu, Fang, MD  magnesium oxide (MAG-OX) 400 MG tablet Take 400 mg by mouth daily.   Yes [provider]  Melatonin 3 MG CAPS Take 3 mg by mouth at bedtime.    Yes [provider]  mirtazapine (REMERON) 15 MG tablet Take 7.5 mg by mouth at bedtime.   Yes [provider]  Morphine Sulfate (MORPHINE CONCENTRATE) 10 mg / 0.5 ml concentrated solution Take 5 mg by mouth every 6 (six) hours as needed for severe pain.   Yes [provider]  oxyCODONE (OXY IR/ROXICODONE) 5 MG immediate release tablet Take 5 mg by mouth every 4 (four) hours as needed for severe pain.   Yes [provider]  Polyethyl Glycol-Propyl Glycol (SYSTANE) 0.4-0.3 % SOLN Place 1 drop into both eyes 2 (two) times daily.   Yes [provider]  polyethylene glycol (MIRALAX / GLYCOLAX) packet Take 17 g by mouth daily. 07/07/18  Yes Albertine GratesXu, Fang, MD  risperiDONE (RISPERDAL) 0.25 MG tablet Take 0.25 mg by mouth at bedtime.   Yes [provider]  senna-docusate (SENOKOT-S) 8.6-50 MG tablet Take 1 tablet by mouth at bedtime. Patient taking differently: Take 1 tablet by mouth daily.  11/08/17  Yes Albertine GratesXu, Fang, MD  bisacodyl (DULCOLAX) 10 MG suppository Place 1 suppository (10 mg total) rectally daily as needed for moderate constipation. 07/07/18   Albertine GratesXu, Fang, MD  loperamide (IMODIUM A-D) 2 MG tablet Take 2 mg by mouth 4 (four) times daily as needed for diarrhea or loose stools.    [provider]    Allergies Patient has no known allergies.   REVIEW OF SYSTEMS     PHYSICAL EXAMINATION  Initial Vital Signs Blood pressure 129/83, pulse (!) 111, temperature (!) 100.7 F (38.2 C), temperature source Rectal, resp. rate (!) 22, SpO2 94 %.  Examination General: Well-developed, cachectic female in no acute distress; appearance consistent with age of record HENT: normocephalic; atraumatic Eyes: Irregular pupils Neck: supple Heart: regular rate and rhythm; tachycardia Lungs: Rattly respirations, left greater than right Abdomen: soft; nondistended; nontender; bowel sounds present Extremities: No deformity; pain on certain movements of right  hip Neurologic: Awake, nonverbal, minimally responsive to stimuli; limited neurologic exam due to dementia Skin: Warm and dry   RESULTS  Summary of this visit's results, reviewed by myself:   EKG Interpretation  Date/Time:    Ventricular Rate:    PR Interval:    QRS Duration:   QT Interval:    QTC Calculation:   R Axis:     Text Interpretation:        Laboratory Studies: Results for orders placed or performed during the hospital encounter of 09/29/18 (from the past 24 hour(s))  Comprehensive metabolic panel     Status: Abnormal   Collection Time: 09/29/18  2:05 AM  Result Value Ref Range   Sodium 149 (H) 135 - 145 mmol/L   Potassium 3.6 3.5 - 5.1 mmol/L   Chloride 116 (H) 98 - 111 mmol/L   CO2 25 22 - 32 mmol/L   Glucose, Bld 118 (H) 70 - 99 mg/dL   BUN 24 (H) 8 - 23 mg/dL   Creatinine, Ser 1.61 0.44 - 1.00 mg/dL   Calcium 9.2 8.9 - 09.6 mg/dL   Total Protein 7.5 6.5 - 8.1 g/dL   Albumin 3.0 (L) 3.5 - 5.0 g/dL   AST 28 15 - 41 U/L   ALT 28 0 - 44 U/L   Alkaline Phosphatase 100 38 - 126 U/L   Total Bilirubin 0.8 0.3 - 1.2 mg/dL   GFR calc non Af Amer >60 >60 mL/min   GFR calc Af Amer >60 >60 mL/min   Anion gap 8 5 - 15  CBC with Differential     Status: None   Collection Time: 09/29/18  2:05 AM  Result Value Ref Range   WBC 8.2 4.0 - 10.5 K/uL   RBC 4.31 3.87 - 5.11 MIL/uL   Hemoglobin 13.1 12.0 - 15.0 g/dL   HCT 04.5 40.9 - 81.1 %   MCV 96.3 80.0 - 100.0 fL   MCH 30.4 26.0 - 34.0 pg   MCHC 31.6 30.0 - 36.0 g/dL   RDW 91.4 78.2 - 95.6 %   Platelets 326 150 - 400 K/uL   nRBC 0.0 0.0 - 0.2 %   Neutrophils Relative % 75 %   Neutro Abs 6.2 1.7 - 7.7 K/uL   Lymphocytes Relative 13 %   Lymphs Abs 1.1 0.7 - 4.0 K/uL   Monocytes Relative 8 %   Monocytes Absolute 0.6 0.1 - 1.0 K/uL   Eosinophils Relative 3 %   Eosinophils Absolute 0.2 0.0 - 0.5 K/uL   Basophils Relative 0 %   Basophils Absolute 0.0 0.0 - 0.1 K/uL   Immature Granulocytes 1 %   Abs Immature  Granulocytes 0.05 0.00 - 0.07 K/uL  Protime-INR     Status: None   Collection Time: 09/29/18  2:05 AM  Result Value Ref Range   Prothrombin Time 14.9 11.4 - 15.2 seconds   INR 1.18  Urinalysis, Routine w reflex microscopic     Status: Abnormal   Collection Time: 09/29/18  2:50 AM  Result Value Ref Range   Color, Urine AMBER (A) YELLOW   APPearance HAZY (A) CLEAR   Specific Gravity, Urine 1.025 1.005 - 1.030   pH 5.0 5.0 - 8.0   Glucose, UA NEGATIVE NEGATIVE mg/dL   Hgb urine dipstick SMALL (A) NEGATIVE   Bilirubin Urine NEGATIVE NEGATIVE   Ketones, ur NEGATIVE NEGATIVE mg/dL   Protein, ur 30 (A) NEGATIVE mg/dL   Nitrite NEGATIVE NEGATIVE   Leukocytes, UA NEGATIVE NEGATIVE   RBC / HPF 0-5 0 - 5 RBC/hpf   WBC, UA 6-10 0 - 5 WBC/hpf   Bacteria, UA RARE (A) NONE SEEN   Mucus PRESENT   I-Stat CG4 Lactic Acid, ED     Status: None   Collection Time: 09/29/18  3:02 AM  Result Value Ref Range   Lactic Acid, Venous 1.48 0.5 - 1.9 mmol/L   Imaging Studies: Dg Chest Port 1 View  Result Date: 09/29/2018 CLINICAL DATA:  Acute onset of fever. EXAM: PORTABLE CHEST 1 VIEW COMPARISON:  Chest radiograph performed 09/19/2018 FINDINGS: There has been marked interval increase in a very large right-sided pneumothorax, with partial collapse of the right lung. There is question of leftward shift of the mediastinum, which could reflect some degree of tension. The left lung appears clear. No pleural effusion is now seen. The cardiomediastinal silhouette is normal in size. No acute osseous abnormalities are seen. Mild chronic left-sided rib deformities are noted. IMPRESSION: Marked interval increase in very large right-sided pneumothorax, with partial collapse of the right lung. Question of leftward shift of the mediastinum, which could reflect some degree of tension. Critical Value/emergent results were called by telephone at the time of interpretation on 09/29/2018 at 2:36 am to Dr. Paula LibraJOHN Porshia Blizzard, who verbally  acknowledged these results. Electronically Signed   By: Roanna RaiderJeffery  Chang M.D.   On: 09/29/2018 02:36    ED COURSE and MDM  Nursing notes and initial vitals signs, including pulse oximetry, reviewed.  Vitals:   09/29/18 0201 09/29/18 0222 09/29/18 0230 09/29/18 0300  BP:  129/83 (!) 129/99 (!) 142/91  Pulse:  (!) 111 (!) 109 (!) 107  Resp:  (!) 22 (!) 23 (!) 24  Temp:  (!) 100.7 F (38.2 C)    TempSrc:  Rectal    SpO2: 94% 94% 93% 91%   2:50 AM Chest x-ray shows interval enlargement of right pneumothorax.  The patient's daughter, who is the patient's primary contact, was consulted.  She did not wish surgical intervention in the form of a chest tube.  She wishes for comfort care only.  PROCEDURES   NEEDLE DECOMPRESSION The patient's right anterior chest was prepped with chlorhexidine and a 20-gauge Angiocath was inserted into the chest cavity.  Approximately 50 mL of air were aspirated.  The patient tolerated this well and there were no immediate complications.  The patient remains in no acute respiratory distress.  ED DIAGNOSES     ICD-10-CM   1. Comfort measures only status Z51.5   2. Fever R50.9 [facility]    DG Chest Port 1 View  3. Pneumothorax on right J93.9        Shareka Casale, Jonny RuizJohn, MD 09/29/18 [phone]

## 2018-09-29 NOTE — H&P (Signed)
History and Physical    Isabella [name] UJW:[id number] DOB: 05/23/1921 DOA: 09/29/2018  PCP: Mortimer Fries, PA   Patient coming from: SNF   Chief Complaint: Fever   HPI: Isabella [name] is a 84 y.o. female under hospice care and with medical history significant for vascular dementia with psychosis, hypertension, seizure disorder, and recurrent right-sided pneumothorax, now presenting to the emergency department with fever.  Patient had been admitted to the hospital in October with a recurrent right-sided pneumothorax, had chest tube placed twice, but pulled it out both times and was discharged to SNF with palliative care, since transition to hospice care.  She was seen back in the ED on 09/18/2018 with leg pain and found to have a femoral neck fracture, but family elected for conservative management.  Patient has been under hospice care at the nursing facility, was noted to have a fever tonight, and was sent to the ED for evaluation of this.  Patient is unable to contribute to the history due to her clinical condition.  ED Course: Upon arrival to the ED, patient is found to be febrile to 38.2 C, saturating low 90s on room air, mildly tachycardic, and with stable blood pressure.  Chemistry panel reveals a mild hypernatremia, elevated BUN to creatinine ratio, and CBC is normal.  Lactic acid is normal.  Urinalysis not suggestive of infection.  Chest x-ray notable for very large right-sided pneumothorax with concern for possible tension.  Needle decompression was performed by the ED physician. Situation was discussed with the patient's daughter who asked that no chest tube be placed, and specifically asked that we provide comfort measures only.  Review of Systems:  Unable to complete ROS secondary to the patient's clinical condition.  Past Medical History:  Diagnosis Date  . Dementia (HCC)   . Hearing loss   . Hypertension   . Seizures (HCC)   . Vascular dementia (HCC)   . Weight loss     History  reviewed. No pertinent surgical history.   reports that she has never smoked. She has never used smokeless tobacco. She reports that she does not drink alcohol or use drugs.  No Known Allergies  Family History  Family history unknown: Yes     Prior to Admission medications   Medication Sig Start Date End Date Taking? Authorizing Provider  acetaminophen (TYLENOL) 500 MG tablet Take 1 tablet (500 mg total) by mouth 2 (two) times daily. 11/08/17  Yes Albertine Grates, MD  aspirin 81 MG chewable tablet Chew 81 mg by mouth daily.   Yes [provider]  haloperidol (HALDOL) 0.5 MG tablet Take 0.25 mg by mouth 4 (four) times daily.   Yes [provider]  levETIRAcetam (KEPPRA) 100 MG/ML solution Take 250 mg by mouth every evening.   Yes [provider]  levETIRAcetam (KEPPRA) 100 MG/ML solution Take 500 mg by mouth every morning.   Yes [provider]  magnesium oxide (MAG-OX) 400 (241.3 Mg) MG tablet Take 1 tablet (400 mg total) by mouth daily. 11/08/17  Yes Albertine Grates, MD  magnesium oxide (MAG-OX) 400 MG tablet Take 400 mg by mouth daily.   Yes [provider]  Melatonin 3 MG CAPS Take 3 mg by mouth at bedtime.   Yes [provider]  mirtazapine (REMERON) 15 MG tablet Take 7.5 mg by mouth at bedtime.   Yes [provider]  Morphine Sulfate (MORPHINE CONCENTRATE) 10 mg / 0.5 ml concentrated solution Take 5 mg by mouth every 6 (six) hours as  needed for severe pain.   Yes [provider]  oxyCODONE (OXY IR/ROXICODONE) 5 MG immediate release tablet Take 5 mg by mouth every 4 (four) hours as needed for severe pain.   Yes [provider]  Polyethyl Glycol-Propyl Glycol (SYSTANE) 0.4-0.3 % SOLN Place 1 drop into both eyes 2 (two) times daily.   Yes [provider]  polyethylene glycol (MIRALAX / GLYCOLAX) packet Take 17 g by mouth daily. 07/07/18  Yes Albertine GratesXu, Fang, MD  risperiDONE (RISPERDAL) 0.25 MG tablet Take 0.25 mg by mouth at  bedtime.   Yes [provider]  senna-docusate (SENOKOT-S) 8.6-50 MG tablet Take 1 tablet by mouth at bedtime. Patient taking differently: Take 1 tablet by mouth daily.  11/08/17  Yes Albertine GratesXu, Fang, MD  bisacodyl (DULCOLAX) 10 MG suppository Place 1 suppository (10 mg total) rectally daily as needed for moderate constipation. 07/07/18   Albertine GratesXu, Fang, MD  loperamide (IMODIUM A-D) 2 MG tablet Take 2 mg by mouth 4 (four) times daily as needed for diarrhea or loose stools.    [provider]    Physical Exam: Vitals:   09/29/18 0201 09/29/18 0222 09/29/18 0230 09/29/18 0300  BP:  129/83 (!) 129/99 (!) 142/91  Pulse:  (!) 111 (!) 109 (!) 107  Resp:  (!) 22 (!) 23 (!) 24  Temp:  (!) 100.7 F (38.2 C)    TempSrc:  Rectal    SpO2: 94% 94% 93% 91%    Constitutional: NAD, cachectic  Eyes: PERTLA, lids and conjunctivae normal ENMT: Mucous membranes are moist. Posterior pharynx clear of any exudate or lesions.   Neck: normal, supple, no masses, no thyromegaly Respiratory: Diminished on right. Mild tachypnea. No pallor or cyanosis.  Cardiovascular: rate ~110 and regular. No extremity edema.   Abdomen: No distension, no tenderness, soft. Bowel sounds active.  Musculoskeletal: no clubbing / cyanosis. No joint deformity upper and lower extremities.    Skin: no significant rashes, lesions, ulcers. Poor turgor. Neurologic: somnolent, wakes briefly with tactile stimulus, but not making eye contact or speaking.    Labs on Admission: I have personally reviewed following labs and imaging studies  CBC: Recent Labs  Lab 09/29/18 0205  WBC 8.2  NEUTROABS 6.2  HGB 13.1  HCT 41.5  MCV 96.3  PLT 326   Basic Metabolic Panel: Recent Labs  Lab 09/22/18 0645 09/29/18 0205  NA 141 149*  K 3.7 3.6  CL 107 116*  CO2 26 25  GLUCOSE 96 118*  BUN 17 24*  CREATININE 0.57 0.67  CALCIUM 8.7* 9.2  PHOS 3.0  --    GFR: Estimated Creatinine Clearance: 30.1 mL/min (by C-G formula based on SCr of  0.67 mg/dL). Liver Function Tests: Recent Labs  Lab 09/22/18 0645 09/29/18 0205  AST  --  28  ALT  --  28  ALKPHOS  --  100  BILITOT  --  0.8  PROT  --  7.5  ALBUMIN 2.8* 3.0*   No results for input(s): LIPASE, AMYLASE in the last 168 hours. No results for input(s): AMMONIA in the last 168 hours. Coagulation Profile: Recent Labs  Lab 09/29/18 0205  INR 1.18   Cardiac Enzymes: No results for input(s): CKTOTAL, CKMB, CKMBINDEX, TROPONINI in the last 168 hours. BNP (last 3 results) No results for input(s): PROBNP in the last 8760 hours. HbA1C: No results for input(s): HGBA1C in the last 72 hours. CBG: Recent Labs  Lab 09/22/18 0811  GLUCAP 85   Lipid Profile: No results for  input(s): CHOL, HDL, LDLCALC, TRIG, CHOLHDL, LDLDIRECT in the last 72 hours. Thyroid Function Tests: No results for input(s): TSH, T4TOTAL, FREET4, T3FREE, THYROIDAB in the last 72 hours. Anemia Panel: No results for input(s): VITAMINB12, FOLATE, FERRITIN, TIBC, IRON, RETICCTPCT in the last 72 hours. Urine analysis:    Component Value Date/Time   COLORURINE AMBER (A) 09/29/2018 0250   APPEARANCEUR HAZY (A) 09/29/2018 0250   LABSPEC 1.025 09/29/2018 0250   PHURINE 5.0 09/29/2018 0250   GLUCOSEU NEGATIVE 09/29/2018 0250   HGBUR SMALL (A) 09/29/2018 0250   BILIRUBINUR NEGATIVE 09/29/2018 0250   KETONESUR NEGATIVE 09/29/2018 0250   PROTEINUR 30 (A) 09/29/2018 0250   UROBILINOGEN 0.2 04/30/2015 2118   NITRITE NEGATIVE 09/29/2018 0250   LEUKOCYTESUR NEGATIVE 09/29/2018 0250   Sepsis Labs: @LABRCNTIP (procalcitonin:4,lacticidven:4) ) Recent Results (from the past 240 hour(s))  Culture, blood (Routine x 2)     Status: None (Preliminary result)   Collection Time: 09/29/18  2:05 AM  Result Value Ref Range Status   Specimen Description   Final    BLOOD LEFT ARM Performed at [facility], [address] [place], [place] [address]    Special Requests   Final    BOTTLES DRAWN AEROBIC AND  ANAEROBIC Blood Culture results may not be optimal due to an excessive volume of blood received in culture bottles Performed at [facility], [address], [place], [place] [address]    Culture PENDING  Incomplete   Report Status PENDING  Incomplete     Radiological Exams on Admission: Dg Chest Port 1 View  Result Date: 09/29/2018 CLINICAL DATA:  Acute onset of fever. EXAM: PORTABLE CHEST 1 VIEW COMPARISON:  Chest radiograph performed 09/19/2018 FINDINGS: There has been marked interval increase in a very large right-sided pneumothorax, with partial collapse of the right lung. There is question of leftward shift of the mediastinum, which could reflect some degree of tension. The left lung appears clear. No pleural effusion is now seen. The cardiomediastinal silhouette is normal in size. No acute osseous abnormalities are seen. Mild chronic left-sided rib deformities are noted. IMPRESSION: Marked interval increase in very large right-sided pneumothorax, with partial collapse of the right lung. Question of leftward shift of the mediastinum, which could reflect some degree of tension. Critical Value/emergent results were called by telephone at the time of interpretation on 09/29/2018 at 2:36 am to Dr. Paula Libra, who verbally acknowledged these results. Electronically Signed   By: Roanna Raider M.D.   On: 09/29/2018 02:36    EKG: Not performed.   Assessment/Plan   1. Recurrent pneumothorax  - Presents with fever, found to have very large right PTX with concern for possible tension  - Needle decompression performed in ED  - Patient's daughter asked that we do not place chest tube and provide comfort measures only  - Continue comfort care   2. Dementia; psychosis  - Not appropriate for oral medications on admission, will use parenteral Haldol as-needed    3. Seizures  - Poorly response on admission, unable to take oral medications at this time, will use IV Ativan  as-needed   4. Hip fracture  - Diagnosed on 12/20, family elected for non-operative management   - Continue symptom management/comfort measures    5. Hypernatremia  - Mild, hypovolemic  - Continue D5-1/2 NS infusion    6. Fever  - WBC and lactate normal, no obvious infectious process, possibly secondary to PTX  - Continue symptom management/comfort measures  DVT prophylaxis: None, comfort measures only  Code Status: DNR  Family Communication: Daughter updated by phone  Consults called: none Admission status: Observation     Briscoe Deutscherimothy S Palma Buster, MD [facility] Pager [phone]  If 7PM-7AM, please contact night-coverage [email or web address] Password [email or web address]  09/29/2018, 4:53 AM

## 2018-09-29 NOTE — ED Triage Notes (Signed)
Patient arrives from [facility] with complaints of fever-EMS got 101.3. Patient is a DNR and a Hospice patient and her normal is unresponsive.

## 2018-09-29 NOTE — ED Notes (Signed)
She continues to rest comfortably.

## 2018-09-29 NOTE — ED Notes (Signed)
ED TO INPATIENT HANDOFF REPORT  Name/Age/Gender Isabella [name] 84 y.o. female  Code Status    Code Status Orders  (From admission, onward)         Start     Ordered   09/29/18 0450  Do not attempt resuscitation (DNR)  Continuous    Question Answer Comment  In the event of cardiac or respiratory ARREST Do not call a "code blue"   In the event of cardiac or respiratory ARREST Do not perform Intubation, CPR, defibrillation or ACLS   In the event of cardiac or respiratory ARREST Use medication by any route, position, wound care, and other measures to relive pain and suffering. May use oxygen, suction and manual treatment of airway obstruction as needed for comfort.      09/29/18 0452        Code Status History    Date Active Date Inactive Code Status Order ID Comments User Context   09/18/2018 1248 09/22/2018 1355 DNR [id number]  Merlene Laughter, DO Inpatient   06/24/2018 0419 07/08/2018 0006 DNR [id number]  Briscoe Deutscher, MD ED   11/02/2017 1543 11/08/2017 1826 DNR [id number]  Nyoka Cowden, MD ED   09/30/2016 1039 09/30/2016 1607 DNR [id number]  Alm Bustard, MD Inpatient   09/30/2016 0245 09/30/2016 1039 Full Code [id number]  Darreld Mclean, MD Inpatient   09/27/2016 2112 09/28/2016 1727 Full Code [id number]  Gwynn Burly, DO Inpatient    Advance Directive Documentation     Most Recent Value  Type of Advance Directive  Out of facility DNR (pink MOST or yellow form)  Pre-existing out of facility DNR order (yellow form or pink MOST form)  -  "MOST" Form in Place?  -      Home/SNF/Other Skilled nursing facility  Chief Complaint Fever  Level of Care/Admitting Diagnosis ED Disposition    ED Disposition Condition Comment   Admit  Hospital Area: [facility] [[id number]]  Level of Care: Med-Surg [16]  Diagnosis: Pneumothorax on right [536644]  Admitting Physician: Briscoe Deutscher [[id number]]  Attending Physician: Briscoe Deutscher [[id number]]  PT Class (Do Not  Modify): Observation [104]  PT Acc Code (Do Not Modify): Observation [10022]       Medical History Past Medical History:  Diagnosis Date  . Dementia (HCC)   . Hearing loss   . Hypertension   . Seizures (HCC)   . Vascular dementia (HCC)   . Weight loss     Allergies No Known Allergies  IV Location/Drains/Wounds Patient Lines/Drains/Airways Status   Active Line/Drains/Airways    Name:   Placement date:   Placement time:   Site:   Days:   Peripheral IV 09/29/18 Right;Posterior Forearm   09/29/18    0223    Forearm   less than 1          Labs/Imaging Results for orders placed or performed during the hospital encounter of 09/29/18 (from the past 48 hour(s))  Comprehensive metabolic panel     Status: Abnormal   Collection Time: 09/29/18  2:05 AM  Result Value Ref Range   Sodium 149 (H) 135 - 145 mmol/L   Potassium 3.6 3.5 - 5.1 mmol/L   Chloride 116 (H) 98 - 111 mmol/L   CO2 25 22 - 32 mmol/L   Glucose, Bld 118 (H) 70 - 99 mg/dL   BUN 24 (H) 8 - 23 mg/dL   Creatinine, Ser 6.43 0.44 - 1.00 mg/dL   Calcium 9.2 8.9 - 32.9 mg/dL  Total Protein 7.5 6.5 - 8.1 g/dL   Albumin 3.0 (L) 3.5 - 5.0 g/dL   AST 28 15 - 41 U/L   ALT 28 0 - 44 U/L   Alkaline Phosphatase 100 38 - 126 U/L   Total Bilirubin 0.8 0.3 - 1.2 mg/dL   GFR calc non Af Amer >60 >60 mL/min   GFR calc Af Amer >60 >60 mL/min   Anion gap 8 5 - 15    Comment: Performed at [facility], [address], [place], [place] [address]  CBC with Differential     Status: None   Collection Time: 09/29/18  2:05 AM  Result Value Ref Range   WBC 8.2 4.0 - 10.5 K/uL   RBC 4.31 3.87 - 5.11 MIL/uL   Hemoglobin 13.1 12.0 - 15.0 g/dL   HCT 60.4 54.0 - 98.1 %   MCV 96.3 80.0 - 100.0 fL   MCH 30.4 26.0 - 34.0 pg   MCHC 31.6 30.0 - 36.0 g/dL   RDW 19.1 47.8 - 29.5 %   Platelets 326 150 - 400 K/uL   nRBC 0.0 0.0 - 0.2 %   Neutrophils Relative % 75 %   Neutro Abs 6.2 1.7 - 7.7 K/uL   Lymphocytes Relative 13  %   Lymphs Abs 1.1 0.7 - 4.0 K/uL   Monocytes Relative 8 %   Monocytes Absolute 0.6 0.1 - 1.0 K/uL   Eosinophils Relative 3 %   Eosinophils Absolute 0.2 0.0 - 0.5 K/uL   Basophils Relative 0 %   Basophils Absolute 0.0 0.0 - 0.1 K/uL   Immature Granulocytes 1 %   Abs Immature Granulocytes 0.05 0.00 - 0.07 K/uL    Comment: Performed at [facility], [address] [place], [place] [address]  Protime-INR     Status: None   Collection Time: 09/29/18  2:05 AM  Result Value Ref Range   Prothrombin Time 14.9 11.4 - 15.2 seconds   INR 1.18     Comment: Performed at [facility], [address] [place], [place] [address]  Culture, blood (Routine x 2)     Status: None (Preliminary result)   Collection Time: 09/29/18  2:05 AM  Result Value Ref Range   Specimen Description      BLOOD LEFT ARM Performed at [facility], [address] [place], [place] [address]    Special Requests      BOTTLES DRAWN AEROBIC AND ANAEROBIC Blood Culture results may not be optimal due to an excessive volume of blood received in culture bottles Performed at [facility], [address] [place], [place] [address]    Culture PENDING    Report Status PENDING   Urinalysis, Routine w reflex microscopic     Status: Abnormal   Collection Time: 09/29/18  2:50 AM  Result Value Ref Range   Color, Urine AMBER (A) YELLOW    Comment: BIOCHEMICALS MAY BE AFFECTED BY COLOR   APPearance HAZY (A) CLEAR   Specific Gravity, Urine 1.025 1.005 - 1.030   pH 5.0 5.0 - 8.0   Glucose, UA NEGATIVE NEGATIVE mg/dL   Hgb urine dipstick SMALL (A) NEGATIVE   Bilirubin Urine NEGATIVE NEGATIVE   Ketones, ur NEGATIVE NEGATIVE mg/dL   Protein, ur 30 (A) NEGATIVE mg/dL   Nitrite NEGATIVE NEGATIVE   Leukocytes, UA NEGATIVE NEGATIVE   RBC / HPF 0-5 0 - 5 RBC/hpf   WBC, UA 6-10 0 - 5 WBC/hpf  Bacteria, UA RARE (A) NONE SEEN   Mucus PRESENT     Comment: Performed at [facility], [address] [place], [place] [address]  I-Stat CG4 Lactic Acid, ED     Status: None   Collection Time: 09/29/18  3:02 AM  Result Value Ref Range   Lactic Acid, Venous 1.48 0.5 - 1.9 mmol/L   Dg Chest Port 1 View  Result Date: 09/29/2018 CLINICAL DATA:  Acute onset of fever. EXAM: PORTABLE CHEST 1 VIEW COMPARISON:  Chest radiograph performed 09/19/2018 FINDINGS: There has been marked interval increase in a very large right-sided pneumothorax, with partial collapse of the right lung. There is question of leftward shift of the mediastinum, which could reflect some degree of tension. The left lung appears clear. No pleural effusion is now seen. The cardiomediastinal silhouette is normal in size. No acute osseous abnormalities are seen. Mild chronic left-sided rib deformities are noted. IMPRESSION: Marked interval increase in very large right-sided pneumothorax, with partial collapse of the right lung. Question of leftward shift of the mediastinum, which could reflect some degree of tension. Critical Value/emergent results were called by telephone at the time of interpretation on 09/29/2018 at 2:36 am to Dr. Paula LibraJOHN MOLPUS, who verbally acknowledged these results. Electronically Signed   By: Roanna RaiderJeffery  Chang M.D.   On: 09/29/2018 02:36   None  Pending Labs Unresulted Labs (From admission, onward)    Start     Ordered   09/29/18 0205  Culture, blood (Routine x 2)  BLOOD CULTURE X 2,   STAT     09/29/18 0205   09/29/18 0205  Urine culture  ONCE - STAT,   STAT     09/29/18 0205          Vitals/Pain Today's Vitals   09/29/18 0830 09/29/18 0845 09/29/18 0900 09/29/18 0915  BP: (!) 142/98  (!) 148/100   Pulse: (!) 106 (!) 105 (!) 107 (!) 102  Resp:      Temp:      TempSrc:      SpO2: 93% (!) 86% 93% 91%  PainSc:        Isolation Precautions No active isolations  Medications Medications  polyvinyl alcohol (LIQUIFILM TEARS) 1.4 % ophthalmic solution 1 drop (has  no administration in time range)  acetaminophen (TYLENOL) tablet 650 mg (has no administration in time range)    Or  acetaminophen (TYLENOL) suppository 650 mg (has no administration in time range)  haloperidol (HALDOL) tablet 0.5 mg (has no administration in time range)    Or  haloperidol (HALDOL) 2 MG/ML solution 0.5 mg (has no administration in time range)    Or  haloperidol lactate (HALDOL) injection 0.5 mg (has no administration in time range)  ondansetron (ZOFRAN-ODT) disintegrating tablet 4 mg (has no administration in time range)    Or  ondansetron (ZOFRAN) injection 4 mg (has no administration in time range)  glycopyrrolate (ROBINUL) tablet 1 mg (has no administration in time range)    Or  glycopyrrolate (ROBINUL) injection 0.2 mg (has no administration in time range)    Or  glycopyrrolate (ROBINUL) injection 0.2 mg (has no administration in time range)  antiseptic oral rinse (BIOTENE) solution 15 mL (has no administration in time range)  polyvinyl alcohol (LIQUIFILM TEARS) 1.4 % ophthalmic solution 1 drop (has no administration in time range)  dextrose 5 %-0.45 % sodium chloride infusion ( Intravenous New Bag/Given 09/29/18 0600)  morphine 2 MG/ML injection 1-2 mg (has no administration in time range)  bisacodyl (DULCOLAX) suppository 10 mg (has no administration in time range)  diphenhydrAMINE (BENADRYL) injection 12.5 mg (has no administration in time range)  LORazepam (ATIVAN) injection 1 mg (has no administration in time range)  acetaminophen (TYLENOL) suppository 650 mg (650 mg Rectal Given 09/29/18 0243)    Mobility non-ambulatory

## 2018-09-29 NOTE — H&P (Signed)
[facility] hospital liaison GIP RN note.  This is a related and covered GIP admission with a [facility] diagnosis of Cerebral Atherosclerosis per Dr. Kirt BoysMonica Carter. Patient is a DNR. [facility] activated EMS for complaints 101.3 temp, congestion. She was admitted for Recurrent pneumothorax.   Visited patient in ED and again at bedside. No visitors present. Patient does not respond to verbal stimuli. She does not appear in discomfort at this time. ED RN reported she improved  following Needle decompression of pneumothorax.  VS: 159/108, HR 111, Resp 18, SpO2 94 on RA  Abnormal labs: Na 149, chloride 116, BUN 24, Albumin 3.0  Medications: D5 1/2 NS @ 5365ml/hr, morphine 2mg  Q2 PRN given at 1550.   Per MD notes: Assessment/Plan   1. Recurrent pneumothorax  - Presents with fever, found to have very large right PTX with concern for possible tension  - Needle decompression performed in ED  - Patient's daughter asked that we do not place chest tube and provide comfort measures only  - Continue comfort care   2. Dementia; psychosis  - Not appropriate for oral medications on admission, will use parenteral Haldol as-needed    3. Seizures  - Poorly response on admission, unable to take oral medications at this time, will use IV Ativan as-needed   4. Hip fracture  - Diagnosed on 12/20, family elected for non-operative management   - Continue symptom management/comfort measures    5. Hypernatremia  - Mild, hypovolemic  - Continue D5-1/2 NS infusion    6. Fever  - WBC and lactate normal, no obvious infectious process, possibly secondary to PTX  - Continue symptom management/comfort measures    Goals of Care: Patient is DNR, comfort care with symptom management;  Discharge Planning: [facility] Will continue to follow while hospitalized and anticipate discharge needs. Please use [facility] if ambulance transport is needed at time of discharge.    Communication with [facility]: Unable to reach family by telephone and no one present during my visit.  Communication with IDG: Team updated.   Thank you, Elsie SaasMary Anne Robertson, RN, [facility] Hospital Liaison (listed on [employer])  [phone]

## 2018-09-29 NOTE — ED Notes (Signed)
Dr. Read DriversMolpus discontinued 2nd IV and 2nd set blood cultures

## 2018-09-29 NOTE — ED Notes (Signed)
Bed: WA21 Expected date:  Expected time:  Means of arrival:  Comments: EMS 84 yo female from SNF-fever and congestion/dementia/unresponsive Fever 101.3

## 2018-09-30 DIAGNOSIS — I10 Essential (primary) hypertension: Secondary | ICD-10-CM

## 2018-09-30 DIAGNOSIS — Z515 Encounter for palliative care: Secondary | ICD-10-CM

## 2018-09-30 DIAGNOSIS — J939 Pneumothorax, unspecified: Secondary | ICD-10-CM

## 2018-09-30 LAB — URINE CULTURE: Culture: <10000 — AB

## 2018-09-30 NOTE — Progress Notes (Signed)
Mouth care done on patient, tongue is coated white and cracked. Patient took a couple sips of water. Lubricant applied to lips that are peeling. Patient becoming more verbal this evening and yelling out. Bed alarm on.

## 2018-09-30 NOTE — Progress Notes (Signed)
[facility] PROGRESS NOTE    Progress Note  Isabella [name]  AVW:[id number] DOB: [birth year] DOA: 09/29/2018 PCP: Mortimer Fries, PA     Brief Narrative:   Isabella [name] is an 85 y.o. female who is under hospice care with a past medical history of vascular dementia and psychosis, with recurrent right-sided pneumothorax presented to the emergency room with fevers he was recently admitted in the hospital for right-sided pneumothorax chest tube was placed but pulled out, discharged to skilled nursing with palliative in order to transition to hospice came back to the ED on 09/18/2018 with a leg pain found to have a femoral fracture but family elected conservative management when she was noted to have fevers tonight and was sent to the ED for evaluation  Assessment/Plan:   Pneumothorax on right: Found to have a fever, and a large tension pneumothorax needle decompressed in the ED. It was discussed with the daughter she decided to move towards comfort measures. Symptomatic management with IV morphine as needed for pain or shortness of breath. KVO IV fluids. Continue Ativan and Haldol for agitation.  Psychosis/dementia: Use Haldol IV as needed.  Seizures (HCC) Poor response unable to take oral medication continue Ativan as needed.  Hip fracture: Diagnosed in 2020 family elected nonoperative management, comfort measures was recommended.  Hypernatremia Stop half-normal as were going into comfort measures. It is personally speak with the daughter and she wanted to move to comfort measures. She agreed the patient to be transferred to a residential hospice facility.  Fever: With a normal lactate and white blood cell count likely due to pneumothorax continue symptomatic management.  DVT prophylaxis: lovenox Family Communication:none Disposition Plan/Barrier to D/C: Residential hospcie Code Status:     Code Status Orders  (From admission, onward)         Start     Ordered   09/29/18  0450  Do not attempt resuscitation (DNR)  Continuous    Question Answer Comment  In the event of cardiac or respiratory ARREST Do not call a "code blue"   In the event of cardiac or respiratory ARREST Do not perform Intubation, CPR, defibrillation or ACLS   In the event of cardiac or respiratory ARREST Use medication by any route, position, wound care, and other measures to relive pain and suffering. May use oxygen, suction and manual treatment of airway obstruction as needed for comfort.      09/29/18 0452        Code Status History    Date Active Date Inactive Code Status Order ID Comments User Context   09/18/2018 1248 09/22/2018 1355 DNR [id number]  Marguerita Merles Carencro, DO Inpatient   06/24/2018 0419 07/08/2018 0006 DNR [id number]  Briscoe Deutscher, MD ED   11/02/2017 1543 11/08/2017 1826 DNR [id number]  Nyoka Cowden, MD ED   09/30/2016 1039 09/30/2016 1607 DNR [id number]  Alm Bustard, MD Inpatient   09/30/2016 0245 09/30/2016 1039 Full Code [id number]  Darreld Mclean, MD Inpatient   09/27/2016 2112 09/28/2016 1727 Full Code [id number]  Gwynn Burly, DO Inpatient    Advance Directive Documentation     Most Recent Value  Type of Advance Directive  Out of facility DNR (pink MOST or yellow form)  Pre-existing out of facility DNR order (yellow form or pink MOST form)  -  "MOST" Form in Place?  -        IV Access:    Peripheral IV   Procedures and diagnostic studies:   Dg Chest [facility]  Result Date: 09/29/2018 CLINICAL DATA:  Acute onset of fever. EXAM: PORTABLE CHEST 1 VIEW COMPARISON:  Chest radiograph performed 09/19/2018 FINDINGS: There has been marked interval increase in a very large right-sided pneumothorax, with partial collapse of the right lung. There is question of leftward shift of the mediastinum, which could reflect some degree of tension. The left lung appears clear. No pleural effusion is now seen. The cardiomediastinal silhouette is normal in size. No acute osseous  abnormalities are seen. Mild chronic left-sided rib deformities are noted. IMPRESSION: Marked interval increase in very large right-sided pneumothorax, with partial collapse of the right lung. Question of leftward shift of the mediastinum, which could reflect some degree of tension. Critical Value/emergent results were called by telephone at the time of interpretation on 09/29/2018 at 2:36 am to Dr. Paula LibraJOHN MOLPUS, who verbally acknowledged these results. Electronically Signed   By: Roanna RaiderJeffery  Chang M.D.   On: 09/29/2018 02:36     Medical Consultants:    None.  Anti-Infectives:   none  Subjective:    Isabella SevinSarah [name] nonverbal not able to communicate  Objective:    Vitals:   09/29/18 1430 09/29/18 1518 09/30/18 0512 09/30/18 0637  BP: (!) 161/110 (!) 159/108 (!) 146/101   Pulse: (!) 110 (!) 111 (!) 102 100  Resp:  18 20   Temp:   97.8 F (36.6 C)   TempSrc:   Oral   SpO2: 93% 94% (!) 74% 100%    Intake/Output Summary (Last 24 hours) at 09/30/2018 1004 Last data filed at 09/29/2018 1500 Gross per 24 hour  Intake 584.41 ml  Output -  Net 584.41 ml   There were no vitals filed for this visit.  Exam: General exam: In no acute distress, cachectic Respiratory system: Good air movement and clear to auscultation. Cardiovascular system: S1 & S2 heard, RRR.  Gastrointestinal system: Abdomen is nondistended, soft and nontender.  Central nervous system: Patient is only responsive to no size stimuli. Extremities: No pedal edema. Skin: No rashes, lesions or ulcers    Data Reviewed:    Labs: Basic Metabolic Panel: Recent Labs  Lab 09/29/18 0205  NA 149*  K 3.6  CL 116*  CO2 25  GLUCOSE 118*  BUN 24*  CREATININE 0.67  CALCIUM 9.2   GFR Estimated Creatinine Clearance: 30.1 mL/min (by C-G formula based on SCr of 0.67 mg/dL). Liver Function Tests: Recent Labs  Lab 09/29/18 0205  AST 28  ALT 28  ALKPHOS 100  BILITOT 0.8  PROT 7.5  ALBUMIN 3.0*   No results for  input(s): LIPASE, AMYLASE in the last 168 hours. No results for input(s): AMMONIA in the last 168 hours. Coagulation profile Recent Labs  Lab 09/29/18 0205  INR 1.18    CBC: Recent Labs  Lab 09/29/18 0205  WBC 8.2  NEUTROABS 6.2  HGB 13.1  HCT 41.5  MCV 96.3  PLT 326   Cardiac Enzymes: No results for input(s): CKTOTAL, CKMB, CKMBINDEX, TROPONINI in the last 168 hours. BNP (last 3 results) No results for input(s): PROBNP in the last 8760 hours. CBG: No results for input(s): GLUCAP in the last 168 hours. D-Dimer: No results for input(s): DDIMER in the last 72 hours. Hgb A1c: No results for input(s): HGBA1C in the last 72 hours. Lipid Profile: No results for input(s): CHOL, HDL, LDLCALC, TRIG, CHOLHDL, LDLDIRECT in the last 72 hours. Thyroid function studies: No results for input(s): TSH, T4TOTAL, T3FREE, THYROIDAB in the last 72 hours.  Invalid input(s): FREET3 Anemia work up:  No results for input(s): VITAMINB12, FOLATE, FERRITIN, TIBC, IRON, RETICCTPCT in the last 72 hours. Sepsis Labs: Recent Labs  Lab 09/29/18 0205 09/29/18 0302  WBC 8.2  --   LATICACIDVEN  --  1.48   Microbiology Recent Results (from the past 240 hour(s))  Culture, blood (Routine x 2)     Status: None (Preliminary result)   Collection Time: 09/29/18  2:05 AM  Result Value Ref Range Status   Specimen Description   Final    BLOOD LEFT ARM Performed at [facility], [address], [place], [place] [address]    Special Requests   Final    BOTTLES DRAWN AEROBIC AND ANAEROBIC Blood Culture results may not be optimal due to an excessive volume of blood received in culture bottles Performed at [facility], [address] [place], [place] [address]    Culture PENDING  Incomplete   Report Status PENDING  Incomplete  Urine culture     Status: Abnormal   Collection Time: 09/29/18  2:50 AM  Result Value Ref Range Status   Specimen Description   Final    URINE,  CATHETERIZED Performed at [facility], [address], [place], [place] [address]    Special Requests   Final    NONE Performed at [facility], [address] [place], [place] [address]    Culture (A)  Final    <10,000 COLONIES/mL INSIGNIFICANT GROWTH Performed at [facility], [address], [place], [place] [address]    Report Status 09/30/2018 FINAL  Final     Medications:   . feeding supplement (ENSURE ENLIVE)  237 mL Oral BID BM  . polyvinyl alcohol  1 drop Both Eyes BID   Continuous Infusions: . dextrose 5 % and 0.45% NaCl 65 mL/hr at 09/29/18 2236     LOS: 1 day   Marinda Elk  [facility]   *Please refer to [email or web address], password [email or web address] to get updated schedule on who will round on this patient, as hospitalists switch teams weekly. If 7PM-7AM, please contact night-coverage at [email or web address], password [email or web address] for any overnight needs.  09/30/2018, 10:04 AM

## 2018-09-30 NOTE — Progress Notes (Addendum)
[facility] SW GIP visit 11:00  As previously documented, this is a related and covered GIP admission as of 09/29/18 with [facility] diagnosis Cerebral Atherosclerosis per Dr. Kirt Boys. Code status is DNR. Out of facility DNR on shadow chart. [facility] activated EMS due to temp of 101.e and congestion. She was admitted with Recurrent pneumothorax. [facility] medication list and transfer summary on shadow chart 09/29/18.  Visited patient at bedside. No visitors present. Patient does not respond to verbal stimuli. Appreciate report from [employer].  Medications:  Continuous infusion D5 1/2 NS @ 58ml/hr. She continues on PRN morphine 2mg  every 2 hrs, last does given at 0514 today. Ensure and eye drops have been discontinued.   Vitals at 512 today: T 97.8, BP 146/101, P 102, R 20, Sp02 74% on Room air. Placed on oxygen 2L [place] with Sp02 100 at 637.   No new labs today.   Per MD notes 09/30/2018: Pneumothorax on right: Found to have a fever, and a large tension pneumothorax needle decompressed in the ED. It was discussed with the daughter she decided to move towards comfort measures. Symptomatic management with IV morphine as needed for pain or shortness of breath. KVO IV fluids. Continue Ativan and Haldol for agitation.  Psychosis/dementia: Use Haldol IV as needed.  Seizures (HCC) Poor response unable to take oral medication continue Ativan as needed.  Hip fracture: Diagnosed in 2020 family elected nonoperative management, comfort measures was recommended.  Hypernatremia Stop half-normal as were going into comfort measures. It is personally speak with the daughter and she wanted to move to comfort measures. She agreed the patient to be transferred to a residential hospice facility.  Fever: With a normal lactate and white blood cell count likely due to pneumothorax continue symptomatic management  Goals of Care/[facility]G Contact: Spoke with daughter  Michaelann Dach by phone to confirm desire for [facility] for end of life care. She confirms and tells me she is familiar with the process. She is aware [facility] [employer] [facility] does not have availability today and someone will follow up again tomorrow.   [facility] Contact: Messages sent to on-call LCSW [place] and LTC LCSW [place]. Messages sent to [facility] coordinators [place] and [place].   Discharge Planning: [facility] will continue follow while hospitalized and anticipate discharge needs. Please use [facility] if ambulance transport is needed.   Thank you,  Forrestine Him, LCSW [phone]

## 2018-10-01 NOTE — Progress Notes (Signed)
[facility] RN GIP visit.   As previously documented, this is a related and covered GIP admission as of 09/29/18 with [facility] diagnosis Cerebral Atherosclerosis per Dr. Kirt BoysMonica Carter. Code status is DNR. Out of facility DNR on shadow chart. [facility] activated EMS due to temp of 101.e and congestion. She was admitted with Recurrent pneumothorax. [facility] medication list and transfer summary on shadow chart 09/29/18.  Visited patient at bedside. No visitors present. Patient does not respond to verbal stimuli.  Patient does not appear in pain or distress at this visit. VS: 132/89, 95, 18 SpO2 100% 2Lnc, afebrile.  Medications: D5 1/2 NS @ 5310ml/hr.   Per MD notes: -Pneumothorax on right: Found to have a fever, and a large tension pneumothorax needle decompressed in the ED. It was discussed with the daughter she decided to move towards comfort measures. Symptomatic management with IV morphine as needed for pain or shortness of breath. KVO IV fluids. Continue Ativan and Haldol for agitation. Patient probably has less than 2 weeks, awaiting residential hospice facility placement.  -Psychosis/dementia: Use Haldol IV as needed.  -Seizures (HCC) Poor response unable to take oral medication continue Ativan as needed.  -Hip fracture: Diagnosed in 2020 family elected nonoperative management, comfort measures was recommended.  H-ypernatremia Stop half-normal as were going into comfort measures.  -Fever: With a normal lactate and white blood cell count likely due to pneumothorax continue symptomatic management.  Goals of Care/PCG Contact: Spoke with daughter Kevin FentonBeverly Lamadrid. She is aware [facility] and someone will follow up again tomorrow.   IDG Contact: Team updated  Discharge Planning: [facility] will continue follow while hospitalized and anticipate discharge needs. Please use [facility] if ambulance transport is needed.    Please call with any hospice related questions.  Thank you, Elsie SaasMary Anne Robertson, RN, [facility] Hospital Liaison ([place] on [place])  [phone]

## 2018-10-01 NOTE — Progress Notes (Signed)
[facility] PROGRESS NOTE    Progress Note  Isabella [name]  ZOX:[id number] DOB: 04/24/1921 DOA: 09/29/2018 PCP: Mortimer Friesurl, David, PA     Brief Narrative:   Isabella [name] is an 85 y.o. female who is under hospice care with a past medical history of vascular dementia and psychosis, with recurrent right-sided pneumothorax presented to the emergency room with fevers he was recently admitted in the hospital for right-sided pneumothorax chest tube was placed but pulled out, discharged to skilled nursing with palliative in order to transition to hospice came back to the ED on 09/18/2018 with a leg pain found to have a femoral fracture but family elected conservative management when she was noted to have fevers tonight and was sent to the ED for evaluation  Assessment/Plan:   Pneumothorax on right: Found to have a fever, and a large tension pneumothorax needle decompressed in the ED. It was discussed with the daughter she decided to move towards comfort measures. Symptomatic management with IV morphine as needed for pain or shortness of breath. KVO IV fluids. Continue Ativan and Haldol for agitation. Patient probably has less than 2 weeks, awaiting residential hospice facility placement. Worker for hospice has come and talk to the [facility] were no beds [facility].  Psychosis/dementia: Use Haldol IV as needed.  Seizures (HCC) Poor response unable to take oral medication continue Ativan as needed.  Hip fracture: Diagnosed in 2020 family elected nonoperative management, comfort measures was recommended.  Hypernatremia Stop half-normal as were going into comfort measures.  Fever: With a normal lactate and white blood cell count likely due to pneumothorax continue symptomatic management.  DVT prophylaxis: lovenox Family Communication:none Disposition Plan/Barrier to D/C: Residential hospcie Code Status:     Code Status Orders  (From admission, onward)         Start      Ordered   09/29/18 0450  Do not attempt resuscitation (DNR)  Continuous    Question Answer Comment  In the event of cardiac or respiratory ARREST Do not call a "code blue"   In the event of cardiac or respiratory ARREST Do not perform Intubation, CPR, defibrillation or ACLS   In the event of cardiac or respiratory ARREST Use medication by any route, position, wound care, and other measures to relive pain and suffering. May use oxygen, suction and manual treatment of airway obstruction as needed for comfort.      09/29/18 0452        Code Status History    Date Active Date Inactive Code Status Order ID Comments User Context   09/18/2018 1248 09/22/2018 1355 DNR [id number]  Merlene LaughterSheikh, Omair Latif, DO Inpatient   06/24/2018 0419 07/08/2018 0006 DNR [id number]  Briscoe Deutscherpyd, Timothy S, MD ED   11/02/2017 1543 11/08/2017 1826 DNR [id number]  Nyoka CowdenWert, Michael B, MD ED   09/30/2016 1039 09/30/2016 1607 DNR [id number]  Alm Bustard'Sullivan, Matthew, MD Inpatient   09/30/2016 0245 09/30/2016 1039 Full Code [id number]  Darreld McleanPatel, Vishal, MD Inpatient   09/27/2016 2112 09/28/2016 1727 Full Code [id number]  Gwynn BurlyWallace, Andrew, DO Inpatient    Advance Directive Documentation     Most Recent Value  Type of Advance Directive  Out of facility DNR (pink MOST or yellow form)  Pre-existing out of facility DNR order (yellow form or pink MOST form)  -  "MOST" Form in Place?  -        IV Access:    Peripheral IV   Procedures and diagnostic studies:   No results found.  Medical Consultants:    None.  Anti-Infectives:   none  Subjective:    Isabella [name] nonverbal not able to communicate  Objective:    Vitals:   09/29/18 1518 09/30/18 0512 09/30/18 0637 10/01/18 0621  BP: (!) 159/108 (!) 146/101  (!) 148/100  Pulse: (!) 111 (!) 102 100 99  Resp: 18 20  18   Temp:  97.8 F (36.6 C)  98.3 F (36.8 C)  TempSrc:  Oral  Oral  SpO2: 94% (!) 74% 100%     Intake/Output Summary (Last 24 hours) at 10/01/2018 0854 Last data filed at  10/01/2018 0700 Gross per 24 hour  Intake 1671.73 ml  Output -  Net 1671.73 ml   There were no vitals filed for this visit.  Exam: General exam: In no acute distress, cachectic Respiratory system: Good air movement and clear to auscultation. Cardiovascular system: S1 & S2 heard, RRR.  Gastrointestinal system: Abdomen is nondistended, soft and nontender.  Central nervous system: Patient is only responsive to no size stimuli. Extremities: No pedal edema. Skin: No rashes, lesions or ulcers    Data Reviewed:    Labs: Basic Metabolic Panel: Recent Labs  Lab 09/29/18 0205  NA 149*  K 3.6  CL 116*  CO2 25  GLUCOSE 118*  BUN 24*  CREATININE 0.67  CALCIUM 9.2   GFR Estimated Creatinine Clearance: 30.1 mL/min (by C-G formula based on SCr of 0.67 mg/dL). Liver Function Tests: Recent Labs  Lab 09/29/18 0205  AST 28  ALT 28  ALKPHOS 100  BILITOT 0.8  PROT 7.5  ALBUMIN 3.0*   No results for input(s): LIPASE, AMYLASE in the last 168 hours. No results for input(s): AMMONIA in the last 168 hours. Coagulation profile Recent Labs  Lab 09/29/18 0205  INR 1.18    CBC: Recent Labs  Lab 09/29/18 0205  WBC 8.2  NEUTROABS 6.2  HGB 13.1  HCT 41.5  MCV 96.3  PLT 326   Cardiac Enzymes: No results for input(s): CKTOTAL, CKMB, CKMBINDEX, TROPONINI in the last 168 hours. BNP (last 3 results) No results for input(s): PROBNP in the last 8760 hours. CBG: No results for input(s): GLUCAP in the last 168 hours. D-Dimer: No results for input(s): DDIMER in the last 72 hours. Hgb A1c: No results for input(s): HGBA1C in the last 72 hours. Lipid Profile: No results for input(s): CHOL, HDL, LDLCALC, TRIG, CHOLHDL, LDLDIRECT in the last 72 hours. Thyroid function studies: No results for input(s): TSH, T4TOTAL, T3FREE, THYROIDAB in the last 72 hours.  Invalid input(s): FREET3 Anemia work up: No results for input(s): VITAMINB12, FOLATE, FERRITIN, TIBC, IRON, RETICCTPCT in the  last 72 hours. Sepsis Labs: Recent Labs  Lab 09/29/18 0205 09/29/18 0302  WBC 8.2  --   LATICACIDVEN  --  1.48   Microbiology Recent Results (from the past 240 hour(s))  Culture, blood (Routine x 2)     Status: None (Preliminary result)   Collection Time: 09/29/18  2:05 AM  Result Value Ref Range Status   Specimen Description   Final    BLOOD LEFT ARM Performed at [facility], [address] [place], [place] [address]    Special Requests   Final    BOTTLES DRAWN AEROBIC AND ANAEROBIC Blood Culture results may not be optimal due to an excessive volume of blood received in culture bottles Performed at [facility], [address] [place], [place] [address]    Culture   Final    NO GROWTH  1 DAY Performed at [facility], [address] [place], [place] [address]    Report Status PENDING  Incomplete  Urine culture     Status: Abnormal   Collection Time: 09/29/18  2:50 AM  Result Value Ref Range Status   Specimen Description   Final    URINE, CATHETERIZED Performed at [facility], [address], [place], [place] [address]    Special Requests   Final    NONE Performed at [facility], [address], [place], [place] [address]    Culture (A)  Final    <10,000 COLONIES/mL INSIGNIFICANT GROWTH Performed at [facility], [address], [place], [place] [address]    Report Status 09/30/2018 FINAL  Final  Culture, blood (Routine x 2)     Status: None (Preliminary result)   Collection Time: 09/29/18  2:57 PM  Result Value Ref Range Status   Specimen Description   Final    BLOOD RIGHT ANTECUBITAL Performed at [facility], [address], [place], [place] [address]    Special Requests   Final    BOTTLES DRAWN AEROBIC ONLY Blood Culture adequate volume Performed at [facility], [address] [place], [place] [address]    Culture   Final    NO GROWTH < 24  HOURS Performed at [facility], [address] [place], [place] [address]    Report Status PENDING  Incomplete     Medications:    Continuous Infusions: . dextrose 5 % and 0.45% NaCl 10 mL/hr at 10/01/18 0354     LOS: 2 days   Marinda Elk  [facility]   *Please refer to [email or web address], password [email or web address] to get updated schedule on who will round on this patient, as hospitalists switch teams weekly. If 7PM-7AM, please contact night-coverage at [email or web address], password [email or web address] for any overnight needs.  10/01/2018, 8:54 AM

## 2018-10-02 NOTE — Progress Notes (Signed)
[facility] PROGRESS NOTE    Progress Note  Isabella SevinSarah [name]  ZOX:[id number] DOB: 06/27/1921 DOA: 09/29/2018 PCP: Mortimer Friesurl, David, PA     Brief Narrative:   Isabella [name] is an 85 y.o. female who is under hospice care with a past medical history of vascular dementia and psychosis, with recurrent right-sided pneumothorax presented to the emergency room with fevers he was recently admitted in the hospital for right-sided pneumothorax chest tube was placed but pulled out, discharged to skilled nursing with palliative in order to transition to hospice came back to the ED on 09/18/2018 with a leg pain found to have a femoral fracture but family elected conservative management when she was noted to have fevers tonight and was sent to the ED for evaluation  Assessment/Plan:   Pneumothorax on right: Very likely due to pneumothorax now resolved. Decided to move towards comfort care continue symptomatic management awaiting residential hospice placement.  Psychosis/dementia: Use Haldol IV as needed.  Seizures (HCC) Poor response unable to take oral medication continue Ativan as needed.  Hip fracture: Diagnosed in 2020 family elected nonoperative management, comfort measures was recommended.  Hypernatremia Stop half-normal as were going into comfort measures.  Fever: With a normal lactate and white blood cell count likely due to pneumothorax continue symptomatic management.  DVT prophylaxis: lovenox Family Communication:none Disposition Plan/Barrier to D/C: Residential hospcie, when bed available. Code Status:     Code Status Orders  (From admission, onward)         Start     Ordered   09/29/18 0450  Do not attempt resuscitation (DNR)  Continuous    Question Answer Comment  In the event of cardiac or respiratory ARREST Do not call a "code blue"   In the event of cardiac or respiratory ARREST Do not perform Intubation, CPR, defibrillation or ACLS   In the event of cardiac or  respiratory ARREST Use medication by any route, position, wound care, and other measures to relive pain and suffering. May use oxygen, suction and manual treatment of airway obstruction as needed for comfort.      09/29/18 0452        Code Status History    Date Active Date Inactive Code Status Order ID Comments User Context   09/18/2018 1248 09/22/2018 1355 DNR [id number]  Merlene LaughterSheikh, Omair Latif, DO Inpatient   06/24/2018 0419 07/08/2018 0006 DNR [id number]  Briscoe Deutscherpyd, Timothy S, MD ED   11/02/2017 1543 11/08/2017 1826 DNR [id number]  Nyoka CowdenWert, Michael B, MD ED   09/30/2016 1039 09/30/2016 1607 DNR [id number]  Alm Bustard'Sullivan, Matthew, MD Inpatient   09/30/2016 0245 09/30/2016 1039 Full Code [id number]  Darreld McleanPatel, Vishal, MD Inpatient   09/27/2016 2112 09/28/2016 1727 Full Code [id number]  Gwynn BurlyWallace, Andrew, DO Inpatient    Advance Directive Documentation     Most Recent Value  Type of Advance Directive  Out of facility DNR (pink MOST or yellow form)  Pre-existing out of facility DNR order (yellow form or pink MOST form)  -  "MOST" Form in Place?  -        IV Access:    Peripheral IV   Procedures and diagnostic studies:   No results found.   Medical Consultants:    None.  Anti-Infectives:   none  Subjective:    Isabella [name] nonverbal not able to communicate  Objective:    Vitals:   09/30/18 0512 09/30/18 0637 10/01/18 0621 10/01/18 1056  BP: (!) 146/101  (!) 148/100 132/89  Pulse: (!) 102 100 99 95  Resp: 20  18 18   Temp: 97.8 F (36.6 C)  98.3 F (36.8 C) 98.1 F (36.7 C)  TempSrc: Oral  Oral Axillary  SpO2: (!) 74% 100%  100%    Intake/Output Summary (Last 24 hours) at 10/02/2018 1517 Last data filed at 10/02/2018 0600 Gross per 24 hour  Intake 407.88 ml  Output 300 ml  Net 107.88 ml   There were no vitals filed for this visit.  Exam: General exam: In no acute distress, cachectic Respiratory system: Good air movement and clear to auscultation. Cardiovascular system: S1 & S2 heard,  RRR.  Gastrointestinal system: Abdomen is nondistended, soft and nontender.  Central nervous system: Patient is only responsive to no size stimuli. Extremities: No pedal edema. Skin: No rashes, lesions or ulcers    Data Reviewed:    Labs: Basic Metabolic Panel: Recent Labs  Lab 09/29/18 0205  NA 149*  K 3.6  CL 116*  CO2 25  GLUCOSE 118*  BUN 24*  CREATININE 0.67  CALCIUM 9.2   GFR Estimated Creatinine Clearance: 30.1 mL/min (by C-G formula based on SCr of 0.67 mg/dL). Liver Function Tests: Recent Labs  Lab 09/29/18 0205  AST 28  ALT 28  ALKPHOS 100  BILITOT 0.8  PROT 7.5  ALBUMIN 3.0*   No results for input(s): LIPASE, AMYLASE in the last 168 hours. No results for input(s): AMMONIA in the last 168 hours. Coagulation profile Recent Labs  Lab 09/29/18 0205  INR 1.18    CBC: Recent Labs  Lab 09/29/18 0205  WBC 8.2  NEUTROABS 6.2  HGB 13.1  HCT 41.5  MCV 96.3  PLT 326   Cardiac Enzymes: No results for input(s): CKTOTAL, CKMB, CKMBINDEX, TROPONINI in the last 168 hours. BNP (last 3 results) No results for input(s): PROBNP in the last 8760 hours. CBG: No results for input(s): GLUCAP in the last 168 hours. D-Dimer: No results for input(s): DDIMER in the last 72 hours. Hgb A1c: No results for input(s): HGBA1C in the last 72 hours. Lipid Profile: No results for input(s): CHOL, HDL, LDLCALC, TRIG, CHOLHDL, LDLDIRECT in the last 72 hours. Thyroid function studies: No results for input(s): TSH, T4TOTAL, T3FREE, THYROIDAB in the last 72 hours.  Invalid input(s): FREET3 Anemia work up: No results for input(s): VITAMINB12, FOLATE, FERRITIN, TIBC, IRON, RETICCTPCT in the last 72 hours. Sepsis Labs: Recent Labs  Lab 09/29/18 0205 09/29/18 0302  WBC 8.2  --   LATICACIDVEN  --  1.48   Microbiology Recent Results (from the past 240 hour(s))  Culture, blood (Routine x 2)     Status: None (Preliminary result)   Collection Time: 09/29/18  2:05 AM    Result Value Ref Range Status   Specimen Description   Final    BLOOD LEFT ARM Performed at [facility], [address], [place], [place] [address]    Special Requests   Final    BOTTLES DRAWN AEROBIC AND ANAEROBIC Blood Culture results may not be optimal due to an excessive volume of blood received in culture bottles Performed at [facility], [address] [place], [place] [address]    Culture   Final    NO GROWTH 3 DAYS Performed at [facility], [address], [place], [place] [address]    Report Status PENDING  Incomplete  Urine culture     Status: Abnormal   Collection Time: 09/29/18  2:50 AM  Result Value Ref Range Status   Specimen Description  Final    URINE, CATHETERIZED Performed at [facility], [address] [place], [place] [address]    Special Requests   Final    NONE Performed at [facility], [address] [place], [place] [address]    Culture (A)  Final    <10,000 COLONIES/mL INSIGNIFICANT GROWTH Performed at [facility], [address] [place], [place] [address]    Report Status 09/30/2018 FINAL  Final  Culture, blood (Routine x 2)     Status: None (Preliminary result)   Collection Time: 09/29/18  2:57 PM  Result Value Ref Range Status   Specimen Description   Final    BLOOD RIGHT ANTECUBITAL Performed at [facility], [address] [place], [place] [address]    Special Requests   Final    BOTTLES DRAWN AEROBIC ONLY Blood Culture adequate volume Performed at [facility], [address] [place], [place] [address]    Culture   Final    NO GROWTH 3 DAYS Performed at [facility], [address] [place], [place] [address]    Report Status PENDING  Incomplete     Medications:    Continuous Infusions: . dextrose 5 % and 0.45% NaCl 10 mL/hr at 10/02/18 0600     LOS: 3 days   Marinda ElkAbraham Feliz Ortiz  [facility]   *Please refer to [email or web address], password [email or web address] to get updated schedule on who will round on this patient, as hospitalists switch teams weekly. If 7PM-7AM, please contact night-coverage at [email or web address], password [email or web address] for any overnight needs.  10/02/2018, 8:52 AM

## 2018-10-02 NOTE — Progress Notes (Signed)
[facility] RN GIP visit.   As previously documented, this is a related and covered GIP admission as of 09/29/18 with [facility] diagnosis Cerebral Atherosclerosis per Dr. Kirt Boys. Code status is DNR.Out of facility DNR on shadow chart.[facility] activated EMS due to temp of 101.e and congestion. She was admitted with Recurrent pneumothorax. [facility] medication list and transfer summary on shadow chart 09/29/18.  Visited patient at bedside. No visitors present. Patient does not respond to verbal stimuli. Patient does not appear in pain or distress during this visit. VS: 143/91, 93, 16 SpO2 93% on room air, afebrile.  Medications: D5 1/2 NS @ 61ml/hr.   Per MD notes: -Pneumothorax on right: Very likely due to pneumothorax now resolved. Decided to move towards comfort care continue symptomatic management awaiting residential hospice placement. -Psychosis/dementia: Use Haldol IV as needed. -Seizures (HCC) Poor response unable to take oral medication continue Ativan as needed. -Hip fracture: Diagnosed in 2020 family elected nonoperative management, comfort measures was recommended. -Hypernatremia Stop half-normal as were going into comfort measures. -Fever: With a normal lactate and white blood cell count likely due to pneumothorax continue symptomatic management.  Goals of Care/PCG Contact: Spoke with daughter Tram Neisler by phone. Updated her on my visit with the patient. She is aware [facility] does not have availability today and someone will follow up again tomorrow.  IDG Contact: Team updated  Discharge Planning: [facility] will continue follow while hospitalized and anticipate discharge needs. Please use [facility] if ambulance transport is needed.   Please call with any hospice related questions.  Thank you, Elsie Saas, RN, [facility] Hospital Liaison (listed on [place])  [phone]

## 2018-10-03 DIAGNOSIS — S72001D Fracture of unspecified part of neck of right femur, subsequent encounter for closed fracture with routine healing: Secondary | ICD-10-CM

## 2018-10-03 MED ORDER — HALOPERIDOL LACTATE 5 MG/ML IJ SOLN: 0.5000 mg | INTRAMUSCULAR | Status: AC | PRN

## 2018-10-03 MED ORDER — MORPHINE SULFATE (PF) 2 MG/ML IV SOLN: 1.0000 mg | INTRAVENOUS | 0 refills | Status: AC | PRN

## 2018-10-03 MED ORDER — LORAZEPAM 2 MG/ML IJ SOLN: 1.0000 mg | INTRAMUSCULAR | 0 refills | Status: AC | PRN

## 2018-10-03 NOTE — Progress Notes (Signed)
[facility] PROGRESS NOTE    Progress Note  Isabella [name]  TCY:[id number] DOB: [birth year] DOA: 09/29/2018 PCP: Mortimer Fries, PA     Brief Narrative:   Isabella [name] is an 85 y.o. female who is under hospice care with a past medical history of vascular dementia and psychosis, with recurrent right-sided pneumothorax presented to the emergency room with fevers he was recently admitted in the hospital for right-sided pneumothorax chest tube was placed but pulled out, discharged to skilled nursing with palliative in order to transition to hospice came back to the ED on 09/18/2018 with a leg pain found to have a femoral fracture but family elected conservative management when she was noted to have fevers tonight and was sent to the ED for evaluation  Assessment/Plan:   Pneumothorax on right: Fever very likely due to pneumothorax now resolved. Awaiting residential hospice facility placement. Patient is stable no acute events  Psychosis/dementia: Use Haldol IV as needed.  Seizures (HCC) Poor response unable to take oral medication continue Ativan as needed.  Hip fracture: Diagnosed in 2020 family elected nonoperative management, comfort measures was recommended.  Hypernatremia Stop half-normal as were going into comfort measures.  Fever: With a normal lactate and white blood cell count likely due to pneumothorax continue symptomatic management.  DVT prophylaxis: lovenox Family Communication:none Disposition Plan/Barrier to D/C: Residential hospcie, when bed available. Code Status:     Code Status Orders  (From admission, onward)         Start     Ordered   09/29/18 0450  Do not attempt resuscitation (DNR)  Continuous    Question Answer Comment  In the event of cardiac or respiratory ARREST Do not call a "code blue"   In the event of cardiac or respiratory ARREST Do not perform Intubation, CPR, defibrillation or ACLS   In the event of cardiac or respiratory ARREST Use  medication by any route, position, wound care, and other measures to relive pain and suffering. May use oxygen, suction and manual treatment of airway obstruction as needed for comfort.      09/29/18 0452        Code Status History    Date Active Date Inactive Code Status Order ID Comments User Context   09/18/2018 1248 09/22/2018 1355 DNR [id number]  Merlene Laughter, DO Inpatient   06/24/2018 0419 07/08/2018 0006 DNR [id number]  Briscoe Deutscher, MD ED   11/02/2017 1543 11/08/2017 1826 DNR [id number]  Nyoka Cowden, MD ED   09/30/2016 1039 09/30/2016 1607 DNR [id number]  Alm Bustard, MD Inpatient   09/30/2016 0245 09/30/2016 1039 Full Code [id number]  Darreld Mclean, MD Inpatient   09/27/2016 2112 09/28/2016 1727 Full Code [id number]  Gwynn Burly, DO Inpatient    Advance Directive Documentation     Most Recent Value  Type of Advance Directive  Out of facility DNR (pink MOST or yellow form)  Pre-existing out of facility DNR order (yellow form or pink MOST form)  -  "MOST" Form in Place?  -        IV Access:    Peripheral IV   Procedures and diagnostic studies:   No results found.   Medical Consultants:    None.  Anti-Infectives:   none  Subjective:    Echo Chaparro nonverbal not able to communicate  Objective:    Vitals:   10/01/18 0621 10/01/18 1056 10/02/18 1001 10/03/18 0515  BP: (!) 148/100 132/89 (!) 143/91 (!) 135/94  Pulse: 99 95 93 96  Resp:  18 18 15 17   Temp: 98.3 F (36.8 C) 98.1 F (36.7 C) 98.1 F (36.7 C) 98.3 F (36.8 C)  TempSrc: Oral Axillary Oral Axillary  SpO2:  100% 93% 94%    Intake/Output Summary (Last 24 hours) at 10/03/2018 0836 Last data filed at 10/02/2018 1745 Gross per 24 hour  Intake 60 ml  Output 0 ml  Net 60 ml   There were no vitals filed for this visit.  Exam: General exam: In no acute distress, cachectic Respiratory system: Good air movement and clear to auscultation. Cardiovascular system: S1 & S2 heard, RRR.    Gastrointestinal system: Abdomen is nondistended, soft and nontender.  Central nervous system: Patient is only responsive to no size stimuli. Extremities: No pedal edema. Skin: No rashes, lesions or ulcers    Data Reviewed:    Labs: Basic Metabolic Panel: Recent Labs  Lab 09/29/18 0205  NA 149*  K 3.6  CL 116*  CO2 25  GLUCOSE 118*  BUN 24*  CREATININE 0.67  CALCIUM 9.2   GFR Estimated Creatinine Clearance: 30.1 mL/min (by C-G formula based on SCr of 0.67 mg/dL). Liver Function Tests: Recent Labs  Lab 09/29/18 0205  AST 28  ALT 28  ALKPHOS 100  BILITOT 0.8  PROT 7.5  ALBUMIN 3.0*   No results for input(s): LIPASE, AMYLASE in the last 168 hours. No results for input(s): AMMONIA in the last 168 hours. Coagulation profile Recent Labs  Lab 09/29/18 0205  INR 1.18    CBC: Recent Labs  Lab 09/29/18 0205  WBC 8.2  NEUTROABS 6.2  HGB 13.1  HCT 41.5  MCV 96.3  PLT 326   Cardiac Enzymes: No results for input(s): CKTOTAL, CKMB, CKMBINDEX, TROPONINI in the last 168 hours. BNP (last 3 results) No results for input(s): PROBNP in the last 8760 hours. CBG: No results for input(s): GLUCAP in the last 168 hours. D-Dimer: No results for input(s): DDIMER in the last 72 hours. Hgb A1c: No results for input(s): HGBA1C in the last 72 hours. Lipid Profile: No results for input(s): CHOL, HDL, LDLCALC, TRIG, CHOLHDL, LDLDIRECT in the last 72 hours. Thyroid function studies: No results for input(s): TSH, T4TOTAL, T3FREE, THYROIDAB in the last 72 hours.  Invalid input(s): FREET3 Anemia work up: No results for input(s): VITAMINB12, FOLATE, FERRITIN, TIBC, IRON, RETICCTPCT in the last 72 hours. Sepsis Labs: Recent Labs  Lab 09/29/18 0205 09/29/18 0302  WBC 8.2  --   LATICACIDVEN  --  1.48   Microbiology Recent Results (from the past 240 hour(s))  Culture, blood (Routine x 2)     Status: None (Preliminary result)   Collection Time: 09/29/18  2:05 AM  Result  Value Ref Range Status   Specimen Description   Final    BLOOD LEFT ARM Performed at [facility], [address] [place], [place] [address]    Special Requests   Final    BOTTLES DRAWN AEROBIC AND ANAEROBIC Blood Culture results may not be optimal due to an excessive volume of blood received in culture bottles Performed at [facility], [address] [place], [place] [address]    Culture NO GROWTH 4 DAYS  Final   Report Status PENDING  Incomplete  Urine culture     Status: Abnormal   Collection Time: 09/29/18  2:50 AM  Result Value Ref Range Status   Specimen Description   Final    URINE, CATHETERIZED Performed at [facility], [address] [place],  [place] [address]    Special Requests   Final    NONE Performed at [facility], [address], [place], [place] [address]    Culture (A)  Final    <10,000 COLONIES/mL INSIGNIFICANT GROWTH Performed at [facility], [address], [place], [place] [address]    Report Status 09/30/2018 FINAL  Final  Culture, blood (Routine x 2)     Status: None (Preliminary result)   Collection Time: 09/29/18  2:57 PM  Result Value Ref Range Status   Specimen Description BLOOD RIGHT ANTECUBITAL  Final   Special Requests   Final    BOTTLES DRAWN AEROBIC ONLY Blood Culture adequate volume Performed at [facility], [address] [place], [place] [address]    Culture NO GROWTH 4 DAYS  Final   Report Status PENDING  Incomplete     Medications:    Continuous Infusions: . dextrose 5 % and 0.45% NaCl 10 mL/hr at 10/02/18 0600     LOS: 4 days   Marinda Elk  [facility]   *Please refer to [email or web address], password [email or web address] to get updated schedule on who will round on this patient, as hospitalists switch teams weekly. If 7PM-7AM, please contact night-coverage at [email or web address], password [email or web address] for any overnight needs.  10/03/2018, 8:36 AM

## 2018-10-03 NOTE — Progress Notes (Signed)
[facility] visit.  As previously documented, this is a related and covered GIP admission as of 09/29/18 with [facility] diagnosis Cerebral Atherosclerosis per Dr. Kirt Boys. Code status is DNR.Out of facility DNR on shadow chart.[facility] activated EMS due to temp of 101.e and congestion. She was admitted with Recurrent pneumothorax. [facility] medication list and transfer summary on shadow chart 09/29/18.  Visited patient at bedside. No visitors present. Patient does not respond to verbal stimuli.Patient sleeping with no distress noted during this visit.  VS: 135/94, 111HR, 17 RR SpO2 94% on room air, afebrile 98.3  Medications: D5 1/2 NS @ 29ml/hr (KVO reports nurse) Haloperidol Lactate 5 mg/ml injection Lorazepam 2 mg/ml injection Morphine 2 mg/ml injection  Per MD notes via D/C/ summary Marinda Elk, MD) 10/03/2018 Fever due to pneumothorax: Needle decompression was done in the ED, and she stabilized. It was discussed with the daughter and her poor prognosis and the daughter decided to move towards comfort measures. She agreed with IV medication for pain and anxiety or shortness of breath.  Psychosis/dementia: Seizures: No changes made to her medication she will continue her Keppra at facility no seizures in the hospital.  Hip fracture: Diagnosed in 2019 family elected nonoperative management.  They decided comfort measures.  Hypernatremia: Likely due to decreased oral intake half-normal saline was stopped as a family requested no further IV fluids or labs. Discharge Instructions      Discharge Instructions    Diet - low sodium heart healthy   Complete by:  As directed    Increase activity slowly   Complete by:  As directed     Goals of Care/PCG Contact: Attempted to reach pt's daughter Arriella Nault by phone.Unavailable will follow up again tomorrow.  IDG Contact:Team updated . Also spoke with bedside  nurse [place] and CSW [place] with updates.  Discharge Planning: [facility] will continue follow while hospitalized and anticipate discharge needs.   Please use [facility] if ambulance transport is needed.Please call with any hospice related questions.   Thank You  Gerri Spore, [facility] Liaison [facility]) [phone]  Hospice Liaison are on AMION

## 2018-10-03 NOTE — Discharge Summary (Signed)
Physician Discharge Summary  Isabella [name] ZOX:[id number] DOB: 1921/03/19 DOA: 09/29/2018  PCP: Mortimer Fries, PA  Admit date: 09/29/2018 Discharge date: 10/03/2018  Admitted From: Home Disposition:  Residential hospice  Recommendations for Outpatient Follow-up:  1. We will go to residential hospice facility.  Home Health:No Equipment/Devices:none  Discharge Condition:Hspice CODE STATUS:DNR Diet recommendation: Heart Healthy   Brief/Interim Summary: 85 y.o. female who is under hospice care with a past medical history of vascular dementia and psychosis, with recurrent right-sided pneumothorax presented to the emergency room with fevers he was recently admitted in the hospital for right-sided pneumothorax chest tube was placed but pulled out, discharged to skilled nursing with palliative in order to transition to hospice came back to the ED on 09/18/2018 with a leg pain found to have a femoral fracture but family elected conservative management when she was noted to have fevers tonight and was sent to the ED for evaluation  Discharge Diagnoses:  Principal Problem:   Pneumothorax on right Active Problems:   Seizures (HCC)   Hypernatremia   Essential hypertension, benign   Vascular dementia (HCC)   Psychosis in elderly (HCC)   Femoral neck fracture (HCC)   Fever Fever due to pneumothorax: Needle decompression was done in the ED, and she stabilized. It was discussed with the daughter and her poor prognosis and the daughter decided to move towards comfort measures. She agreed with IV medication for pain and anxiety or shortness of breath.  Psychosis/dementia: Seizures: No changes made to her medication she will continue her Keppra at facility no seizures in the hospital.  Hip fracture: Diagnosed in 2019 family elected nonoperative management.  They decided comfort measures.  Hypernatremia: Likely due to decreased oral intake half-normal saline was stopped as a family requested no  further IV fluids or labs.   Discharge Instructions  Discharge Instructions    Diet - low sodium heart healthy   Complete by:  As directed    Increase activity slowly   Complete by:  As directed      Allergies as of 10/03/2018   No Known Allergies     Medication List    STOP taking these medications   acetaminophen 500 MG tablet Commonly known as:  TYLENOL   aspirin 81 MG chewable tablet   bisacodyl 10 MG suppository Commonly known as:  DULCOLAX   haloperidol 0.5 MG tablet Commonly known as:  HALDOL   levETIRAcetam 100 MG/ML solution Commonly known as:  KEPPRA   loperamide 2 MG tablet Commonly known as:  IMODIUM A-D   magnesium oxide 400 (241.3 Mg) MG tablet Commonly known as:  MAG-OX   magnesium oxide 400 MG tablet Commonly known as:  MAG-OX   Melatonin 3 MG Caps   mirtazapine 15 MG tablet Commonly known as:  REMERON   morphine CONCENTRATE 10 mg / 0.5 ml concentrated solution Replaced by:  morphine 2 MG/ML injection   oxyCODONE 5 MG immediate release tablet Commonly known as:  Oxy IR/ROXICODONE   polyethylene glycol packet Commonly known as:  MIRALAX / GLYCOLAX   risperiDONE 0.25 MG tablet Commonly known as:  RISPERDAL   senna-docusate 8.6-50 MG tablet Commonly known as:  Senokot-S   SYSTANE 0.4-0.3 % Soln Generic drug:  Polyethyl Glycol-Propyl Glycol     TAKE these medications   haloperidol lactate 5 MG/ML injection Commonly known as:  HALDOL Inject 0.1 mLs (0.5 mg total) into the vein every 4 (four) hours as needed (or delirium).   LORazepam 2 MG/ML injection Commonly known as:  ATIVAN Inject 0.5 mLs (1 mg total) into the vein every 4 (four) hours as needed for seizure.   morphine 2 MG/ML injection Inject 0.5-1 mLs (1-2 mg total) into the vein every 2 (two) hours as needed (or dyspnea). Replaces:  morphine CONCENTRATE 10 mg / 0.5 ml concentrated solution       No Known  Allergies  Consultations:  None   Procedures/Studies: X-ray Chest Pa And Lateral  Result Date: 09/19/2018 CLINICAL DATA:  Shortness of breath, history hypertension, dementia, seizures EXAM: CHEST - 2 VIEW COMPARISON:  09/18/2018 FINDINGS: Borderline enlargement of cardiac silhouette. Atherosclerotic calcification aorta. RIGHT hydropneumothorax with moderate RIGHT pleural effusion and interval increase in size of a RIGHT pneumothorax since previous exam, estimated 10-15%. No mediastinal shift. LEFT lung clear. No LEFT pleural effusion or pneumothorax. Bones demineralized. IMPRESSION: RIGHT hydropneumothorax with slight increase in both the hydrothorax and pneumothorax component since the previous exam. Electronically Signed   By: Ulyses Southward M.D.   On: 09/19/2018 13:14   Ct Head Wo Contrast  Result Date: 09/18/2018 CLINICAL DATA:  Fall yesterday EXAM: CT HEAD WITHOUT CONTRAST TECHNIQUE: Contiguous axial images were obtained from the base of the skull through the vertex without intravenous contrast. COMPARISON:  07/26/2018 FINDINGS: Brain: Chronic atrophic and ischemic changes are again noted and stable. No findings to suggest acute hemorrhage, acute infarction or space-occupying mass lesion are seen. Vascular: No hyperdense vessel or unexpected calcification. Skull: Normal. Negative for fracture or focal lesion. Sinuses/Orbits: No acute finding. Other: None. IMPRESSION: Chronic atrophic and ischemic changes stable from the prior exam. No acute abnormality noted. Electronically Signed   By: Alcide Clever M.D.   On: 09/18/2018 11:37   Dg Chest Port 1 View  Result Date: 09/29/2018 CLINICAL DATA:  Acute onset of fever. EXAM: PORTABLE CHEST 1 VIEW COMPARISON:  Chest radiograph performed 09/19/2018 FINDINGS: There has been marked interval increase in a very large right-sided pneumothorax, with partial collapse of the right lung. There is question of leftward shift of the mediastinum, which could reflect  some degree of tension. The left lung appears clear. No pleural effusion is now seen. The cardiomediastinal silhouette is normal in size. No acute osseous abnormalities are seen. Mild chronic left-sided rib deformities are noted. IMPRESSION: Marked interval increase in very large right-sided pneumothorax, with partial collapse of the right lung. Question of leftward shift of the mediastinum, which could reflect some degree of tension. Critical Value/emergent results were called by telephone at the time of interpretation on 09/29/2018 at 2:36 am to Dr. Paula Libra, who verbally acknowledged these results. Electronically Signed   By: Roanna Raider M.D.   On: 09/29/2018 02:36   Dg Chest Port 1 View  Result Date: 09/18/2018 CLINICAL DATA:  Pain following fall EXAM: PORTABLE CHEST 1 VIEW COMPARISON:  July 26, 2018 FINDINGS: There is elevation of the right hemidiaphragm with loculated effusion on the right laterally. There is a pneumothorax on the right in the apical and lateral regions, considerably smaller than on prior study. No tension component. There is scarring with volume loss in the left apex. The lungs elsewhere are clear. Heart is upper normal in size with pulmonary vascularity normal. No adenopathy. There is aortic atherosclerosis. Bones are osteoporotic. There are old healed rib fractures on the right. IMPRESSION: Elevation right hemidiaphragm. Hydropneumothorax on the right, considerably smaller compared to 2 months prior. No tension component. Scarring with volume loss right apex. Left lung clear. Stable cardiac silhouette. There is aortic atherosclerosis. Aortic Atherosclerosis (ICD10-I70.0). Critical  Value/emergent results were called by telephone at the time of interpretation on 09/18/2018 at 11:01 am to Dr. Derwood Kaplan , who verbally acknowledged these results. Electronically Signed   By: Bretta Bang III M.D.   On: 09/18/2018 11:01   Dg Hips Bilat W Or Wo Pelvis 3-4 Views  Result  Date: 09/18/2018 CLINICAL DATA:  Right hip pain. Possible fall. EXAM: DG HIP (WITH OR WITHOUT PELVIS) 3-4V BILAT COMPARISON:  None. FINDINGS: There is linear lucency in the lateral aspect of the right femoral neck. The left hip has a normal appearance. Lower lumbar spine degenerative changes. Old, healed left inferior pubic ramus and left ischial fractures. IMPRESSION: Probable nondisplaced right femoral neck fracture. Confirmation with CT or MRI of the hip is recommended. Electronically Signed   By: Beckie Salts M.D.   On: 09/18/2018 09:12      Subjective: Nonverbal comfortable in bed.  In no acute distress  Discharge Exam: Vitals:   10/02/18 1001 10/03/18 0515  BP: (!) 143/91 (!) 135/94  Pulse: 93 96  Resp: 15 17  Temp: 98.1 F (36.7 C) 98.3 F (36.8 C)  SpO2: 93% 94%   Vitals:   10/01/18 0621 10/01/18 1056 10/02/18 1001 10/03/18 0515  BP: (!) 148/100 132/89 (!) 143/91 (!) 135/94  Pulse: 99 95 93 96  Resp: 18 18 15 17   Temp: 98.3 F (36.8 C) 98.1 F (36.7 C) 98.1 F (36.7 C) 98.3 F (36.8 C)  TempSrc: Oral Axillary Oral Axillary  SpO2:  100% 93% 94%    General: Pt is alert, awake, not in acute distress Cardiovascular: RRR, S1/S2 +, no rubs, no gallops Respiratory: CTA bilaterally, no wheezing, no rhonchi Abdominal: Soft, NT, ND, bowel sounds + Extremities: no edema, no cyanosis    The results of significant diagnostics from this hospitalization (including imaging, microbiology, ancillary and laboratory) are listed below for reference.     Microbiology: Recent Results (from the past 240 hour(s))  Culture, blood (Routine x 2)     Status: None (Preliminary result)   Collection Time: 09/29/18  2:05 AM  Result Value Ref Range Status   Specimen Description   Final    BLOOD LEFT ARM Performed at [facility], [address] [place], [place] [address]    Special Requests   Final    BOTTLES DRAWN AEROBIC AND ANAEROBIC Blood Culture results may not be optimal due  to an excessive volume of blood received in culture bottles Performed at [facility], [address], [place], [place] [address]    Culture NO GROWTH 4 DAYS  Final   Report Status PENDING  Incomplete  Urine culture     Status: Abnormal   Collection Time: 09/29/18  2:50 AM  Result Value Ref Range Status   Specimen Description   Final    URINE, CATHETERIZED Performed at [facility], [address] [place], [place] [address]    Special Requests   Final    NONE Performed at [facility], [address], [place], [place] [address]    Culture (A)  Final    <10,000 COLONIES/mL INSIGNIFICANT GROWTH Performed at [facility], [address] [place], [place] [address]    Report Status 09/30/2018 FINAL  Final  Culture, blood (Routine x 2)     Status: None (Preliminary result)   Collection Time: 09/29/18  2:57 PM  Result Value Ref Range Status   Specimen Description BLOOD RIGHT ANTECUBITAL  Final  Special Requests   Final    BOTTLES DRAWN AEROBIC ONLY Blood Culture adequate volume Performed at [facility], [address] [place], [place] [address]    Culture NO GROWTH 4 DAYS  Final   Report Status PENDING  Incomplete     Labs: BNP (last 3 results) No results for input(s): BNP in the last 8760 hours. Basic Metabolic Panel: Recent Labs  Lab 09/29/18 0205  NA 149*  K 3.6  CL 116*  CO2 25  GLUCOSE 118*  BUN 24*  CREATININE 0.67  CALCIUM 9.2   Liver Function Tests: Recent Labs  Lab 09/29/18 0205  AST 28  ALT 28  ALKPHOS 100  BILITOT 0.8  PROT 7.5  ALBUMIN 3.0*   No results for input(s): LIPASE, AMYLASE in the last 168 hours. No results for input(s): AMMONIA in the last 168 hours. CBC: Recent Labs  Lab 09/29/18 0205  WBC 8.2  NEUTROABS 6.2  HGB 13.1  HCT 41.5  MCV 96.3  PLT 326   Cardiac Enzymes: No results for input(s): CKTOTAL, CKMB, CKMBINDEX, TROPONINI in the last 168  hours. BNP: Invalid input(s): POCBNP CBG: No results for input(s): GLUCAP in the last 168 hours. D-Dimer No results for input(s): DDIMER in the last 72 hours. Hgb A1c No results for input(s): HGBA1C in the last 72 hours. Lipid Profile No results for input(s): CHOL, HDL, LDLCALC, TRIG, CHOLHDL, LDLDIRECT in the last 72 hours. Thyroid function studies No results for input(s): TSH, T4TOTAL, T3FREE, THYROIDAB in the last 72 hours.  Invalid input(s): FREET3 Anemia work up No results for input(s): VITAMINB12, FOLATE, FERRITIN, TIBC, IRON, RETICCTPCT in the last 72 hours. Urinalysis    Component Value Date/Time   COLORURINE AMBER (A) 09/29/2018 0250   APPEARANCEUR HAZY (A) 09/29/2018 0250   LABSPEC 1.025 09/29/2018 0250   PHURINE 5.0 09/29/2018 0250   GLUCOSEU NEGATIVE 09/29/2018 0250   HGBUR SMALL (A) 09/29/2018 0250   BILIRUBINUR NEGATIVE 09/29/2018 0250   KETONESUR NEGATIVE 09/29/2018 0250   PROTEINUR 30 (A) 09/29/2018 0250   UROBILINOGEN 0.2 04/30/2015 2118   NITRITE NEGATIVE 09/29/2018 0250   LEUKOCYTESUR NEGATIVE 09/29/2018 0250   Sepsis Labs Invalid input(s): PROCALCITONIN,  WBC,  LACTICIDVEN Microbiology Recent Results (from the past 240 hour(s))  Culture, blood (Routine x 2)     Status: None (Preliminary result)   Collection Time: 09/29/18  2:05 AM  Result Value Ref Range Status   Specimen Description   Final    BLOOD LEFT ARM Performed at [facility], [address] [place], [place] [address]    Special Requests   Final    BOTTLES DRAWN AEROBIC AND ANAEROBIC Blood Culture results may not be optimal due to an excessive volume of blood received in culture bottles Performed at [facility], [address] [place], [place] [address]    Culture NO GROWTH 4 DAYS  Final   Report Status PENDING  Incomplete  Urine culture     Status: Abnormal   Collection Time: 09/29/18  2:50 AM  Result Value Ref Range Status   Specimen Description   Final     URINE, CATHETERIZED Performed at [facility], [address] [place], [place] [address]    Special Requests   Final    NONE Performed at [facility], [address] [place], [place] [address]    Culture (A)  Final    <10,000 COLONIES/mL INSIGNIFICANT GROWTH Performed at [facility],  [address] [place], [place] [address]    Report Status 09/30/2018 FINAL  Final  Culture, blood (Routine x 2)     Status: None (Preliminary result)   Collection Time: 09/29/18  2:57 PM  Result Value Ref Range Status   Specimen Description BLOOD RIGHT ANTECUBITAL  Final   Special Requests   Final    BOTTLES DRAWN AEROBIC ONLY Blood Culture adequate volume Performed at [facility], [address] [place], [place] [address]    Culture NO GROWTH 4 DAYS  Final   Report Status PENDING  Incomplete     Time coordinating discharge: 40 minutes  SIGNED:   Marinda ElkAbraham Feliz Ortiz, MD  [facility] 10/03/2018, 9:34 AM Pager   If 7PM-7AM, please contact night-coverage [email or web address] Password [email or web address]

## 2018-10-04 LAB — CULTURE, BLOOD (ROUTINE X 2)
Culture: NO GROWTH
Culture: NO GROWTH
Special Requests: ADEQUATE

## 2018-10-04 NOTE — Progress Notes (Signed)
[facility] PROGRESS NOTE    Progress Note  Isabella SevinSarah [name]  ONG:[id number] DOB: 06/04/1921 DOA: 09/29/2018 PCP: Mortimer Friesurl, David, PA     Brief Narrative:   Isabella SevinSarah [name] is an 85 y.o. female who is under hospice care with a past medical history of vascular dementia and psychosis, with recurrent right-sided pneumothorax presented to the emergency room with fevers he was recently admitted in the hospital for right-sided pneumothorax chest tube was placed but pulled out, discharged to skilled nursing with palliative in order to transition to hospice came back to the ED on 09/18/2018 with a leg pain found to have a femoral fracture but family elected conservative management when she was noted to have fevers tonight and was sent to the ED for evaluation  Assessment/Plan:   Pneumothorax on right: No acute events awaiting residential hospice placement.  Psychosis/dementia: Use Haldol IV as needed.  Seizures (HCC) Poor response unable to take oral medication continue Ativan as needed.  Hip fracture: Diagnosed in 2020 family elected nonoperative management, comfort measures was recommended.  Hypernatremia Stop half-normal as were going into comfort measures.  Fever: With a normal lactate and white blood cell count likely due to pneumothorax continue symptomatic management.  DVT prophylaxis: lovenox Family Communication:none Disposition Plan/Barrier to D/C: Transfer to residential hospcie, when bed available. Code Status:     Code Status Orders  (From admission, onward)         Start     Ordered   09/29/18 0450  Do not attempt resuscitation (DNR)  Continuous    Question Answer Comment  In the event of cardiac or respiratory ARREST Do not call a "code blue"   In the event of cardiac or respiratory ARREST Do not perform Intubation, CPR, defibrillation or ACLS   In the event of cardiac or respiratory ARREST Use medication by any route, position, wound care, and other measures to  relive pain and suffering. May use oxygen, suction and manual treatment of airway obstruction as needed for comfort.      09/29/18 0452        Code Status History    Date Active Date Inactive Code Status Order ID Comments User Context   09/18/2018 1248 09/22/2018 1355 DNR [id number]  Merlene LaughterSheikh, Omair Latif, DO Inpatient   06/24/2018 0419 07/08/2018 0006 DNR [id number]  Briscoe Deutscherpyd, Timothy S, MD ED   11/02/2017 1543 11/08/2017 1826 DNR [id number]  Nyoka CowdenWert, Michael B, MD ED   09/30/2016 1039 09/30/2016 1607 DNR [id number]  Alm Bustard'Sullivan, Matthew, MD Inpatient   09/30/2016 0245 09/30/2016 1039 Full Code [id number]  Darreld McleanPatel, Vishal, MD Inpatient   09/27/2016 2112 09/28/2016 1727 Full Code [id number]  Gwynn BurlyWallace, Andrew, DO Inpatient    Advance Directive Documentation     Most Recent Value  Type of Advance Directive  Out of facility DNR (pink MOST or yellow form)  Pre-existing out of facility DNR order (yellow form or pink MOST form)  -  "MOST" Form in Place?  -        IV Access:    Peripheral IV   Procedures and diagnostic studies:   No results found.   Medical Consultants:    None.  Anti-Infectives:   none  Subjective:    Isabella SevinSarah [name] nonverbal not able to communicate  Objective:    Vitals:   10/01/18 1056 10/02/18 1001 10/03/18 0515 10/04/18 0637  BP: 132/89 (!) 143/91 (!) 135/94 (!) 162/81  Pulse: 95 93 96 83  Resp: 18 15 17 16   Temp: 98.1 F (36.7 C)  98.1 F (36.7 C) 98.3 F (36.8 C) (!) 97 F (36.1 C)  TempSrc: Axillary Oral Axillary Axillary  SpO2: 100% 93% 94% 95%    Intake/Output Summary (Last 24 hours) at 10/04/2018 0851 Last data filed at 10/04/2018 0540 Gross per 24 hour  Intake 476.66 ml  Output 100 ml  Net 376.66 ml   There were no vitals filed for this visit.  Exam: General exam: In no acute distress, cachectic Respiratory system: Good air movement and clear to auscultation. Cardiovascular system: S1 & S2 heard, RRR.  Gastrointestinal system: Abdomen is nondistended, soft  and nontender.  Central nervous system: Patient is only responsive to noci stimuli. Extremities: No pedal edema. Skin: No rashes, lesions or ulcers    Data Reviewed:    Labs: Basic Metabolic Panel: Recent Labs  Lab 09/29/18 0205  NA 149*  K 3.6  CL 116*  CO2 25  GLUCOSE 118*  BUN 24*  CREATININE 0.67  CALCIUM 9.2   GFR Estimated Creatinine Clearance: 30.1 mL/min (by C-G formula based on SCr of 0.67 mg/dL). Liver Function Tests: Recent Labs  Lab 09/29/18 0205  AST 28  ALT 28  ALKPHOS 100  BILITOT 0.8  PROT 7.5  ALBUMIN 3.0*   No results for input(s): LIPASE, AMYLASE in the last 168 hours. No results for input(s): AMMONIA in the last 168 hours. Coagulation profile Recent Labs  Lab 09/29/18 0205  INR 1.18    CBC: Recent Labs  Lab 09/29/18 0205  WBC 8.2  NEUTROABS 6.2  HGB 13.1  HCT 41.5  MCV 96.3  PLT 326   Cardiac Enzymes: No results for input(s): CKTOTAL, CKMB, CKMBINDEX, TROPONINI in the last 168 hours. BNP (last 3 results) No results for input(s): PROBNP in the last 8760 hours. CBG: No results for input(s): GLUCAP in the last 168 hours. D-Dimer: No results for input(s): DDIMER in the last 72 hours. Hgb A1c: No results for input(s): HGBA1C in the last 72 hours. Lipid Profile: No results for input(s): CHOL, HDL, LDLCALC, TRIG, CHOLHDL, LDLDIRECT in the last 72 hours. Thyroid function studies: No results for input(s): TSH, T4TOTAL, T3FREE, THYROIDAB in the last 72 hours.  Invalid input(s): FREET3 Anemia work up: No results for input(s): VITAMINB12, FOLATE, FERRITIN, TIBC, IRON, RETICCTPCT in the last 72 hours. Sepsis Labs: Recent Labs  Lab 09/29/18 0205 09/29/18 0302  WBC 8.2  --   LATICACIDVEN  --  1.48   Microbiology Recent Results (from the past 240 hour(s))  Culture, blood (Routine x 2)     Status: None   Collection Time: 09/29/18  2:05 AM  Result Value Ref Range Status   Specimen Description   Final    BLOOD LEFT  ARM Performed at [facility], [address] [place], [place] [address]    Special Requests   Final    BOTTLES DRAWN AEROBIC AND ANAEROBIC Blood Culture results may not be optimal due to an excessive volume of blood received in culture bottles Performed at [facility], [address] [place], [place] [address]    Culture NO GROWTH 5 DAYS  Final   Report Status 10/04/2018 FINAL  Final  Urine culture     Status: Abnormal   Collection Time: 09/29/18  2:50 AM  Result Value Ref Range Status   Specimen Description   Final    URINE, CATHETERIZED Performed at [facility], [address] [place], [place] [address]    Special Requests   Final  NONE Performed at [facility], [address], [place], [place] [address]    Culture (A)  Final    <10,000 COLONIES/mL INSIGNIFICANT GROWTH Performed at [facility], [address] [place], [place] [address]    Report Status 09/30/2018 FINAL  Final  Culture, blood (Routine x 2)     Status: None   Collection Time: 09/29/18  2:57 PM  Result Value Ref Range Status   Specimen Description BLOOD RIGHT ANTECUBITAL  Final   Special Requests   Final    BOTTLES DRAWN AEROBIC ONLY Blood Culture adequate volume Performed at [facility], [address], [place], [place] [address]    Culture NO GROWTH 5 DAYS  Final   Report Status 10/04/2018 FINAL  Final     Medications:    Continuous Infusions: . dextrose 5 % and 0.45% NaCl 10 mL/hr at 10/04/18 0540     LOS: 5 days   Marinda Elk  [facility]   *Please refer to [email or web address], password [email or web address] to get updated schedule on who will round on this patient, as hospitalists switch teams weekly. If 7PM-7AM, please contact night-coverage at [email or web address], password [email or web address] for any overnight needs.  10/04/2018, 8:51 AM

## 2018-10-04 NOTE — Progress Notes (Signed)
[facility] ([facility])RNGIP visit @0900   As previously documented, this is a related and covered GIP admission as of 09/29/18 with [facility] diagnosis Cerebral Atherosclerosis per Dr. Kirt Boys. Code status is DNR.Out of facility DNR on shadow chart.[facility] activated EMS due to temp of 101.e and congestion. She was admitted with Recurrent pneumothorax. [facility] medication list and transfer summary on shadow chart 09/29/18.  Visited patient at bedside. No visitors present. Patient does not respond to verbal stimuli.Patient sleeping with no distressor pain noted duringthis visit.  VS:162/81,107HR, 16 RRSpO2 95% on room air, afebrile 97 Axillary weight via bed scales 99.6 lbs. IV right forearm C/D/I placed on 09/29/18.  Medications: D5 1/2 NS @ 12ml/hr (KVO reports nurse) Haloperidol Lactate 5 mg/ml injection Lorazepam 2 mg/ml injection Morphine 2 mg/ml injection  Per MD notes: Dr. David Stall 10/04/2018 Pneumothorax on right: No acute events awaiting residential hospice placement.  Psychosis/dementia: Use Haldol IV as needed.  Seizures (HCC) Poor response unable to take oral medication continue Ativan as needed.  Hip fracture: Diagnosed in 2020 family elected nonoperative management, comfort measures was recommended.  Hypernatremia Stop half-normal as were going into comfort measures.  Fever: With a normal lactate and white blood cell count likely due to pneumothorax continue symptomatic management.  DVT prophylaxis: lovenox Family Communication:none Disposition Plan/Barrier to D/C: Transfer to residential hospcie, when bed available. Discharge Instructions      Discharge Instructions   Diet - low sodium heart healthy Complete by: As directed    Increase activity slowly Complete by: As directed     Goals of Care/PCG Contact: spoke with daughter Meriam Sprague concerning pt's status pending BP request.  IDG  Contact:Team updated . Also spoke with bedside nurse Arline Asp and CSW De Borgia with updates.  Discharge Planning: [facility] will continue follow while hospitalized and anticipate discharge needs.   Please use [facility] if ambulance transport is needed.Please call with any hospice related questions.   Thank You  Gerri Spore, [facility] Liaison [facility]) [phone]  Hospice Liaison are on AMION

## 2018-10-05 NOTE — Progress Notes (Signed)
[facility])  Admitted on 12/31 to [facility] ED due to worsening of her known pneumothorax. She has a [facility] diagnosis of cerebral atherosclerosis.  This is a related and covered hospital admission.  Visited pt at the bedside. No family present. She is awake, mumbles incoherently and reaches for my hand.  She did not appear to be in any pain.    V/S: 97.2 ax, 141/90, HR 72, RR 22, 97% RA  No recent labwork.  PRNs, continuous infusions:  D5 1/2NS@ 10 ml/hr, morphine 2mg  IV x 1 overnight for pain.  Total output last 24 hours .  No oral intake recorded since 1/4 72ml.  Right sided pneumothorax:  On room air, imaging on 12/31 showing large right pneumothorax  Pt is currently on the list for a bed at [facility].  However there is not a bed available.  Pt currently is not having many symptoms to be managed that would require residential hospice.  Updated CSW on this as well as family, that an alternate plan should be considered.  She will remain on the waiting list.  Please call with any hospice related questions or concerns.    Thank you, Wallis Bamberg BSN, RN [facility] Liaison (listed in [place]) [phone]

## 2018-10-05 NOTE — Progress Notes (Signed)
[facility] PROGRESS NOTE    Progress Note  Isabella [name]  [id number] DOB: 06/08/1921 DOA: 09/29/2018 PCP: Mortimer Friesurl, David, PA     Brief Narrative:   Isabella [name] is an 85 y.o. female who is under hospice care with a past medical history of vascular dementia and psychosis, with recurrent right-sided pneumothorax presented to the emergency room with fevers he was recently admitted in the hospital for right-sided pneumothorax chest tube was placed but pulled out, discharged to skilled nursing with palliative in order to transition to hospice came back to the ED on 09/18/2018 with a leg pain found to have a femoral fracture but family elected conservative management when she was noted to have fevers tonight and was sent to the ED for evaluation  Assessment/Plan:   Pneumothorax on right: No acute events awaiting residential hospice placement. Patient complaining bed.  No event overnight.  Psychosis/dementia: Use Haldol IV as needed.  Seizures (HCC) Poor response unable to take oral medication continue Ativan as needed.  Hip fracture: Diagnosed in 2020 family elected nonoperative management, comfort measures was recommended.  Hypernatremia Stop half-normal as were going into comfort measures.  Fever: With a normal lactate and white blood cell count likely due to pneumothorax continue symptomatic management.  DVT prophylaxis: lovenox Family Communication:none Disposition Plan/Barrier to D/C: Transfer to residential hospcie, when bed available. Code Status:     Code Status Orders  (From admission, onward)         Start     Ordered   09/29/18 0450  Do not attempt resuscitation (DNR)  Continuous    Question Answer Comment  In the event of cardiac or respiratory ARREST Do not call a "code blue"   In the event of cardiac or respiratory ARREST Do not perform Intubation, CPR, defibrillation or ACLS   In the event of cardiac or respiratory ARREST Use medication by any route,  position, wound care, and other measures to relive pain and suffering. May use oxygen, suction and manual treatment of airway obstruction as needed for comfort.      09/29/18 0452        Code Status History    Date Active Date Inactive Code Status Order ID Comments User Context   09/18/2018 1248 09/22/2018 1355 DNR [id number]  Merlene LaughterSheikh, Omair Latif, DO Inpatient   06/24/2018 0419 07/08/2018 0006 DNR [id number]  Briscoe Deutscherpyd, Timothy S, MD ED   11/02/2017 1543 11/08/2017 1826 DNR [id number]  Nyoka CowdenWert, Michael B, MD ED   09/30/2016 1039 09/30/2016 1607 DNR [id number]  Alm Bustard'Sullivan, Matthew, MD Inpatient   09/30/2016 0245 09/30/2016 1039 Full Code [id number]  Darreld McleanPatel, Vishal, MD Inpatient   09/27/2016 2112 09/28/2016 1727 Full Code [id number]  Gwynn BurlyWallace, Andrew, DO Inpatient    Advance Directive Documentation     Most Recent Value  Type of Advance Directive  Out of facility DNR (pink MOST or yellow form)  Pre-existing out of facility DNR order (yellow form or pink MOST form)  -  "MOST" Form in Place?  -        IV Access:    Peripheral IV   Procedures and diagnostic studies:   No results found.   Medical Consultants:    None.  Anti-Infectives:   none  Subjective:    Isabella [name] nonverbal not able to communicate  Objective:    Vitals:   10/02/18 1001 10/03/18 0515 10/04/18 0637 10/05/18 0400  BP: (!) 143/91 (!) 135/94 (!) 162/81 (!) 141/90  Pulse: 93 96 83 72  Resp: 15 17  16 (!) 22  Temp: 98.1 F (36.7 C) 98.3 F (36.8 C) (!) 97 F (36.1 C) (!) 97.2 F (36.2 C)  TempSrc: Oral Axillary Axillary Axillary  SpO2: 93% 94% 95% 97%    Intake/Output Summary (Last 24 hours) at 10/05/2018 0902 Last data filed at 10/05/2018 0400 Gross per 24 hour  Intake -  Output 300 ml  Net -300 ml   There were no vitals filed for this visit.  Exam: General exam: In no acute distress, cachectic Respiratory system: Good air movement and clear to auscultation. Cardiovascular system: S1 & S2 heard, RRR.    Gastrointestinal system: Abdomen is nondistended, soft and nontender.  Central nervous system: Patient is only responsive to noci stimuli. Extremities: No pedal edema. Skin: No rashes, lesions or ulcers    Data Reviewed:    Labs: Basic Metabolic Panel: Recent Labs  Lab 09/29/18 0205  NA 149*  K 3.6  CL 116*  CO2 25  GLUCOSE 118*  BUN 24*  CREATININE 0.67  CALCIUM 9.2   GFR CrCl cannot be calculated (Unknown ideal weight.). Liver Function Tests: Recent Labs  Lab 09/29/18 0205  AST 28  ALT 28  ALKPHOS 100  BILITOT 0.8  PROT 7.5  ALBUMIN 3.0*   No results for input(s): LIPASE, AMYLASE in the last 168 hours. No results for input(s): AMMONIA in the last 168 hours. Coagulation profile Recent Labs  Lab 09/29/18 0205  INR 1.18    CBC: Recent Labs  Lab 09/29/18 0205  WBC 8.2  NEUTROABS 6.2  HGB 13.1  HCT 41.5  MCV 96.3  PLT 326   Cardiac Enzymes: No results for input(s): CKTOTAL, CKMB, CKMBINDEX, TROPONINI in the last 168 hours. BNP (last 3 results) No results for input(s): PROBNP in the last 8760 hours. CBG: No results for input(s): GLUCAP in the last 168 hours. D-Dimer: No results for input(s): DDIMER in the last 72 hours. Hgb A1c: No results for input(s): HGBA1C in the last 72 hours. Lipid Profile: No results for input(s): CHOL, HDL, LDLCALC, TRIG, CHOLHDL, LDLDIRECT in the last 72 hours. Thyroid function studies: No results for input(s): TSH, T4TOTAL, T3FREE, THYROIDAB in the last 72 hours.  Invalid input(s): FREET3 Anemia work up: No results for input(s): VITAMINB12, FOLATE, FERRITIN, TIBC, IRON, RETICCTPCT in the last 72 hours. Sepsis Labs: Recent Labs  Lab 09/29/18 0205 09/29/18 0302  WBC 8.2  --   LATICACIDVEN  --  1.48   Microbiology Recent Results (from the past 240 hour(s))  Culture, blood (Routine x 2)     Status: None   Collection Time: 09/29/18  2:05 AM  Result Value Ref Range Status   Specimen Description   Final     BLOOD LEFT ARM Performed at [facility], [address], [place], [place] [address]    Special Requests   Final    BOTTLES DRAWN AEROBIC AND ANAEROBIC Blood Culture results may not be optimal due to an excessive volume of blood received in culture bottles Performed at [facility], [address], [place], [place] [address]    Culture NO GROWTH 5 DAYS  Final   Report Status 10/04/2018 FINAL  Final  Urine culture     Status: Abnormal   Collection Time: 09/29/18  2:50 AM  Result Value Ref Range Status   Specimen Description   Final    URINE, CATHETERIZED Performed at [facility], [address], [place], [place] [address]    Special Requests  Final    NONE Performed at [facility], [address] [place], [place] [address]    Culture (A)  Final    <10,000 COLONIES/mL INSIGNIFICANT GROWTH Performed at [facility], [address], [place], [place] [address]    Report Status 09/30/2018 FINAL  Final  Culture, blood (Routine x 2)     Status: None   Collection Time: 09/29/18  2:57 PM  Result Value Ref Range Status   Specimen Description BLOOD RIGHT ANTECUBITAL  Final   Special Requests   Final    BOTTLES DRAWN AEROBIC ONLY Blood Culture adequate volume Performed at [facility], [address], [place], [place] [address]    Culture NO GROWTH 5 DAYS  Final   Report Status 10/04/2018 FINAL  Final     Medications:    Continuous Infusions: . dextrose 5 % and 0.45% NaCl 10 mL/hr at 10/04/18 0540     LOS: 6 days   Marinda Elk  [facility]   *Please refer to [email or web address], password [email or web address] to get updated schedule on who will round on this patient, as hospitalists switch teams weekly. If 7PM-7AM, please contact night-coverage at [email or web address], password [email or web address] for any overnight needs.  10/05/2018, 9:02 AM

## 2018-10-05 NOTE — Progress Notes (Signed)
Clinical Social Worker following patient for support and discharge needs. CSW [place] stating that [facility] does not [facility] bed available for patient and she will continue to be on the waiting list.Jennifer stated an alternative discharge plan would need to be considered by the family. CSW made MD aware that [facility] is unable to offer patient a bed. MD suggested that family look into SNF with palliative following. CSW will follow up with family on alternative discharge plan for patient.   Marrianne MoodAshley Katelan Hirt, MSW,  Theresia MajorsLCSWA [phone]

## 2018-10-06 NOTE — Progress Notes (Signed)
[facility] [place] [facility])  Admitted on 12/31 to [facility] ED due to worsening of her known pneumothorax. She has a [facility] diagnosis of cerebral atherosclerosis.  This was a related and covered hospital admission. Due to her stabilization of symptoms she is now routine level of care.  Visited pt at the bedside. No family present. She is awake, but does not respond appropriately or follow commands. She does not appear in distress.    VS: 139/100, 104, 20, SpO2  94% RA, afebrile.  Medications: D5 1/2NS @ 58ml/hr.   Total output none recorded.  Oral intake .   Pt is currently on the list for a bed at [facility].  However there is not a bed available. [facility] CSW aware and plans to update family.   IDG: team updated.   Please call with any hospice related questions or concerns.   Please use [facility] if ambulance transport is needed at discharge.    Thank you, Elsie Saas, RN, [facility] Liaison (listed in [place]) [phone]

## 2018-10-06 NOTE — Progress Notes (Signed)
Patient still on wait list for [facility] will notify CSW once bed is available.   Stacy Gardner, LCSW Clinical Social Worker  [facility]  [phone]

## 2018-10-06 NOTE — Progress Notes (Signed)
[facility] ([facility] Liaison note.  Patient can transfer to [facility] on 10/07/2018. Hospital Liaison will notify CSW when paperwork is complete to.   Please call with any hospice related questions or concerns.  Thank you, Elsie Saas, RN, [facility] Liaison (listed on AMION)

## 2018-10-06 NOTE — Progress Notes (Signed)
[facility] PROGRESS NOTE    Progress Note  Isabella [name]  BXU:[id number] DOB: 1921-04-19 DOA: 09/29/2018 PCP: Mortimer Fries, PA     Brief Narrative:   Isabella [name] is an 85 y.o. female who is under hospice care with a past medical history of vascular dementia and psychosis, with recurrent right-sided pneumothorax presented to the emergency room with fevers he was recently admitted in the hospital for right-sided pneumothorax chest tube was placed but pulled out, discharged to skilled nursing with palliative in order to transition to hospice came back to the ED on 09/18/2018 with a leg pain found to have a femoral fracture but family elected conservative management when she was noted to have fevers tonight and was sent to the ED for evaluation  Assessment/Plan:   Pneumothorax on right: No acute events. Patient complaining bed.  No event overnight. She is not eating or drinking she is not on IV fluids, her life expectancy is 2 weeks. Awaiting residential hospice placement  Psychosis/dementia: Use Haldol IV as needed. No events when she has been lethargic most of the day yesterday.  Seizures (HCC) Poor response, unable to take oral medication continue Ativan as needed.  Hip fracture: Diagnosed in 2020 family elected nonoperative management, comfort measures was recommended.  Hypernatremia Stop half-normal as were going into comfort measures.  Fever: With a normal lactate and white blood cell count likely due to pneumothorax continue symptomatic management.  DVT prophylaxis: lovenox Family Communication:none Disposition Plan/Barrier to D/C: Transfer to residential hospcie, when bed available. Code Status:     Code Status Orders  (From admission, onward)         Start     Ordered   09/29/18 0450  Do not attempt resuscitation (DNR)  Continuous    Question Answer Comment  In the event of cardiac or respiratory ARREST Do not call a "code blue"   In the event of cardiac  or respiratory ARREST Do not perform Intubation, CPR, defibrillation or ACLS   In the event of cardiac or respiratory ARREST Use medication by any route, position, wound care, and other measures to relive pain and suffering. May use oxygen, suction and manual treatment of airway obstruction as needed for comfort.      09/29/18 0452        Code Status History    Date Active Date Inactive Code Status Order ID Comments User Context   09/18/2018 1248 09/22/2018 1355 DNR [id number]  Merlene Laughter, DO Inpatient   06/24/2018 0419 07/08/2018 0006 DNR [id number]  Briscoe Deutscher, MD ED   11/02/2017 1543 11/08/2017 1826 DNR [id number]  Nyoka Cowden, MD ED   09/30/2016 1039 09/30/2016 1607 DNR [id number]  Alm Bustard, MD Inpatient   09/30/2016 0245 09/30/2016 1039 Full Code [id number]  Darreld Mclean, MD Inpatient   09/27/2016 2112 09/28/2016 1727 Full Code [id number]  Gwynn Burly, DO Inpatient    Advance Directive Documentation     Most Recent Value  Type of Advance Directive  Out of facility DNR (pink MOST or yellow form)  Pre-existing out of facility DNR order (yellow form or pink MOST form)  -  "MOST" Form in Place?  -        IV Access:    Peripheral IV   Procedures and diagnostic studies:   No results found.   Medical Consultants:    None.  Anti-Infectives:   none  Subjective:    Isabella [name] nonverbal not able to communicate  Objective:    Vitals:  10/05/18 1305 10/05/18 1320 10/05/18 1432 10/06/18 0504  BP: (!) 133/105 (!) 147/97  (!) 139/100  Pulse: (!) 106   (!) 104  Resp: 20 20  20   Temp: 97.8 F (36.6 C)   98.1 F (36.7 C)  TempSrc: Axillary   Oral  SpO2: 96%   94%  Weight:   42.5 kg   Height:   5' (1.524 m)     Intake/Output Summary (Last 24 hours) at 10/06/2018 0923 Last data filed at 10/06/2018 0600 Gross per 24 hour  Intake 960 ml  Output 500 ml  Net 460 ml   Filed Weights   10/05/18 1432  Weight: 42.5 kg    Exam: General exam: In no  acute distress, cachectic Respiratory system: Good air movement and clear to auscultation. Cardiovascular system: S1 & S2 heard, RRR.  Gastrointestinal system: Abdomen is nondistended, soft and nontender.  Central nervous system: Patient is only responsive to noci stimuli. Extremities: No pedal edema. Skin: No rashes, lesions or ulcers    Data Reviewed:    Labs: Basic Metabolic Panel: No results for input(s): NA, K, CL, CO2, GLUCOSE, BUN, CREATININE, CALCIUM, MG, PHOS in the last 168 hours. GFR Estimated Creatinine Clearance: 28.2 mL/min (by C-G formula based on SCr of 0.67 mg/dL). Liver Function Tests: No results for input(s): AST, ALT, ALKPHOS, BILITOT, PROT, ALBUMIN in the last 168 hours. No results for input(s): LIPASE, AMYLASE in the last 168 hours. No results for input(s): AMMONIA in the last 168 hours. Coagulation profile No results for input(s): INR, PROTIME in the last 168 hours.  CBC: No results for input(s): WBC, NEUTROABS, HGB, HCT, MCV, PLT in the last 168 hours. Cardiac Enzymes: No results for input(s): CKTOTAL, CKMB, CKMBINDEX, TROPONINI in the last 168 hours. BNP (last 3 results) No results for input(s): PROBNP in the last 8760 hours. CBG: No results for input(s): GLUCAP in the last 168 hours. D-Dimer: No results for input(s): DDIMER in the last 72 hours. Hgb A1c: No results for input(s): HGBA1C in the last 72 hours. Lipid Profile: No results for input(s): CHOL, HDL, LDLCALC, TRIG, CHOLHDL, LDLDIRECT in the last 72 hours. Thyroid function studies: No results for input(s): TSH, T4TOTAL, T3FREE, THYROIDAB in the last 72 hours.  Invalid input(s): FREET3 Anemia work up: No results for input(s): VITAMINB12, FOLATE, FERRITIN, TIBC, IRON, RETICCTPCT in the last 72 hours. Sepsis Labs: No results for input(s): PROCALCITON, WBC, LATICACIDVEN in the last 168 hours. Microbiology Recent Results (from the past 240 hour(s))  Culture, blood (Routine x 2)     Status:  None   Collection Time: 09/29/18  2:05 AM  Result Value Ref Range Status   Specimen Description   Final    BLOOD LEFT ARM Performed at [facility], [address] [place], [place] [address]    Special Requests   Final    BOTTLES DRAWN AEROBIC AND ANAEROBIC Blood Culture results may not be optimal due to an excessive volume of blood received in culture bottles Performed at [facility], [address] [place], [place] [address]    Culture NO GROWTH 5 DAYS  Final   Report Status 10/04/2018 FINAL  Final  Urine culture     Status: Abnormal   Collection Time: 09/29/18  2:50 AM  Result Value Ref Range Status   Specimen Description   Final    URINE, CATHETERIZED Performed at [facility], [address] [place], [place] [address]    Special Requests  Final    NONE Performed at [facility], [address], [place], [place] [address]    Culture (A)  Final    <10,000 COLONIES/mL INSIGNIFICANT GROWTH Performed at [facility], [address], [place], [place] [address]    Report Status 09/30/2018 FINAL  Final  Culture, blood (Routine x 2)     Status: None   Collection Time: 09/29/18  2:57 PM  Result Value Ref Range Status   Specimen Description BLOOD RIGHT ANTECUBITAL  Final   Special Requests   Final    BOTTLES DRAWN AEROBIC ONLY Blood Culture adequate volume Performed at [facility], [address] [place], [place] [address]    Culture NO GROWTH 5 DAYS  Final   Report Status 10/04/2018 FINAL  Final     Medications:    Continuous Infusions: . dextrose 5 % and 0.45% NaCl 10 mL/hr at 10/06/18 0600     LOS: 7 days   Marinda Elk  [facility]   *Please refer to [email or web address], password [email or web address] to get updated schedule on who will round on this patient, as hospitalists switch teams weekly. If 7PM-7AM, please contact night-coverage at [email or web address], password [email or web address] for any  overnight needs.  10/06/2018, 9:23 AM

## 2018-10-07 NOTE — Discharge Summary (Signed)
Discharge Summary  Isabella [name] GHW:[id number] DOB: 03-Jun-1921  PCP: Mortimer Fries, PA  Admit date: 09/29/2018 Discharge date: 10/07/2018  Time spent: 35 minutes  Recommendations for Outpatient Follow-up:  1. Follow-up with hospice care  Discharge Diagnoses:  Active Hospital Problems   Diagnosis Date Noted  . Pneumothorax on right 11/02/2017  . Fever 09/29/2018  . Femoral neck fracture (HCC) 09/18/2018  . Psychosis in elderly (HCC) 11/10/2017  . Vascular dementia (HCC) 11/10/2017  . Essential hypertension, benign 11/10/2017  . Hypernatremia 11/02/2017  . Seizures (HCC) 09/27/2016    Resolved Hospital Problems  No resolved problems to display.    Diet recommendation: Pleasure feeding as tolerated  Vitals:   10/06/18 0504 10/07/18 0514  BP: (!) 139/100 (!) 148/108  Pulse: (!) 104 95  Resp: 20 20  Temp: 98.1 F (36.7 C) 97.8 F (36.6 C)  SpO2: 94% 97%    History of present illness:  Isabella [name] is an 85 y.o. female who is under hospice care with a past medical history of vascular dementia and psychosis, with recurrent right-sided pneumothorax presented to the emergency room with fevers.  She was recently admitted in the hospital for right-sided pneumothorax chest tube was placed but pulled out, discharged to skilled nursing with palliative in order to transition to hospice came back to the ED on 09/18/2018 with a leg pain found to have a femoral fracture but family elected conservative management when she was noted to have fevers and was sent to the ED for evaluation.  10/07/2018: Patient seen and examined at bedside.  Patient is somnolent and does not appear in distress.  She will be discharged to [facility] with hospice care.  Hospital Course:  Principal Problem:   Pneumothorax on right Active Problems:   Seizures (HCC)   Hypernatremia   Essential hypertension, benign   Vascular dementia (HCC)   Psychosis in elderly (HCC)   Femoral neck fracture (HCC)    Fever  Assessment: Pneumothorax on right: Psychosis/dementia: Seizures (HCC) Hypernatremia  Plan: Continue with hospice care   CODE STATUS: DNR.  Comfort measures only.    Discharge Exam: BP (!) 148/108 (BP Location: Right Arm)   Pulse 95   Temp 97.8 F (36.6 C) (Oral)   Resp 20   Ht 5' (1.524 m)   Wt 42.5 kg   SpO2 97%   BMI 18.30 kg/m  . General: 85 y.o. year-old female well developed well nourished in no acute distress.  Somnolent. . Cardiovascular: Regular rate and rhythm with no rubs or gallops.  No thyromegaly or JVD noted.   Marland Kitchen Respiratory: Rales at bases with no wheezes.  Poor inspiratory effort. . Abdomen: Soft nontender nondistended with normal bowel sounds x4 quadrants. . Musculoskeletal: No lower extremity edema. 2/4 pulses in all 4 extremities. Marland Kitchen Psychiatry: Mood is appropriate for condition and setting  Discharge Instructions You were cared for by a hospitalist during your hospital stay. If you have any questions about your discharge medications or the care you received while you were in the hospital after you are discharged, you can call the unit and asked to speak with the hospitalist on call if the hospitalist that took care of you is not available. Once you are discharged, your primary care physician will handle any further medical issues. Please note that NO REFILLS for any discharge medications will be authorized once you are discharged, as it is imperative that you return to your primary care physician (or establish a relationship with a primary care physician  if you do not have one) for your aftercare needs so that they can reassess your need for medications and monitor your lab values.  Discharge Instructions    Diet - low sodium heart healthy   Complete by:  As directed    Increase activity slowly   Complete by:  As directed      Allergies as of 10/07/2018   No Known Allergies     Medication List    STOP taking these medications   acetaminophen  500 MG tablet Commonly known as:  TYLENOL   aspirin 81 MG chewable tablet   bisacodyl 10 MG suppository Commonly known as:  DULCOLAX   haloperidol 0.5 MG tablet Commonly known as:  HALDOL   levETIRAcetam 100 MG/ML solution Commonly known as:  KEPPRA   loperamide 2 MG tablet Commonly known as:  IMODIUM A-D   magnesium oxide 400 (241.3 Mg) MG tablet Commonly known as:  MAG-OX   magnesium oxide 400 MG tablet Commonly known as:  MAG-OX   Melatonin 3 MG Caps   mirtazapine 15 MG tablet Commonly known as:  REMERON   morphine CONCENTRATE 10 mg / 0.5 ml concentrated solution Replaced by:  morphine 2 MG/ML injection   oxyCODONE 5 MG immediate release tablet Commonly known as:  Oxy IR/ROXICODONE   polyethylene glycol packet Commonly known as:  MIRALAX / GLYCOLAX   risperiDONE 0.25 MG tablet Commonly known as:  RISPERDAL   senna-docusate 8.6-50 MG tablet Commonly known as:  Senokot-S   SYSTANE 0.4-0.3 % Soln Generic drug:  Polyethyl Glycol-Propyl Glycol     TAKE these medications   haloperidol lactate 5 MG/ML injection Commonly known as:  HALDOL Inject 0.1 mLs (0.5 mg total) into the vein every 4 (four) hours as needed (or delirium).   LORazepam 2 MG/ML injection Commonly known as:  ATIVAN Inject 0.5 mLs (1 mg total) into the vein every 4 (four) hours as needed for seizure.   morphine 2 MG/ML injection Inject 0.5-1 mLs (1-2 mg total) into the vein every 2 (two) hours as needed (or dyspnea). Replaces:  morphine CONCENTRATE 10 mg / 0.5 ml concentrated solution      No Known Allergies    The results of significant diagnostics from this hospitalization (including imaging, microbiology, ancillary and laboratory) are listed below for reference.    Significant Diagnostic Studies: X-ray Chest Pa And Lateral  Result Date: 09/19/2018 CLINICAL DATA:  Shortness of breath, history hypertension, dementia, seizures EXAM: CHEST - 2 VIEW COMPARISON:  09/18/2018 FINDINGS:  Borderline enlargement of cardiac silhouette. Atherosclerotic calcification aorta. RIGHT hydropneumothorax with moderate RIGHT pleural effusion and interval increase in size of a RIGHT pneumothorax since previous exam, estimated 10-15%. No mediastinal shift. LEFT lung clear. No LEFT pleural effusion or pneumothorax. Bones demineralized. IMPRESSION: RIGHT hydropneumothorax with slight increase in both the hydrothorax and pneumothorax component since the previous exam. Electronically Signed   By: Ulyses SouthwardMark  Boles M.D.   On: 09/19/2018 13:14   Ct Head Wo Contrast  Result Date: 09/18/2018 CLINICAL DATA:  Fall yesterday EXAM: CT HEAD WITHOUT CONTRAST TECHNIQUE: Contiguous axial images were obtained from the base of the skull through the vertex without intravenous contrast. COMPARISON:  07/26/2018 FINDINGS: Brain: Chronic atrophic and ischemic changes are again noted and stable. No findings to suggest acute hemorrhage, acute infarction or space-occupying mass lesion are seen. Vascular: No hyperdense vessel or unexpected calcification. Skull: Normal. Negative for fracture or focal lesion. Sinuses/Orbits: No acute finding. Other: None. IMPRESSION: Chronic atrophic and ischemic changes stable  from the prior exam. No acute abnormality noted. Electronically Signed   By: Alcide Clever M.D.   On: 09/18/2018 11:37   Dg Chest Port 1 View  Result Date: 09/29/2018 CLINICAL DATA:  Acute onset of fever. EXAM: PORTABLE CHEST 1 VIEW COMPARISON:  Chest radiograph performed 09/19/2018 FINDINGS: There has been marked interval increase in a very large right-sided pneumothorax, with partial collapse of the right lung. There is question of leftward shift of the mediastinum, which could reflect some degree of tension. The left lung appears clear. No pleural effusion is now seen. The cardiomediastinal silhouette is normal in size. No acute osseous abnormalities are seen. Mild chronic left-sided rib deformities are noted. IMPRESSION: Marked  interval increase in very large right-sided pneumothorax, with partial collapse of the right lung. Question of leftward shift of the mediastinum, which could reflect some degree of tension. Critical Value/emergent results were called by telephone at the time of interpretation on 09/29/2018 at 2:36 am to Dr. Paula Libra, who verbally acknowledged these results. Electronically Signed   By: Roanna Raider M.D.   On: 09/29/2018 02:36   Dg Chest Port 1 View  Result Date: 09/18/2018 CLINICAL DATA:  Pain following fall EXAM: PORTABLE CHEST 1 VIEW COMPARISON:  July 26, 2018 FINDINGS: There is elevation of the right hemidiaphragm with loculated effusion on the right laterally. There is a pneumothorax on the right in the apical and lateral regions, considerably smaller than on prior study. No tension component. There is scarring with volume loss in the left apex. The lungs elsewhere are clear. Heart is upper normal in size with pulmonary vascularity normal. No adenopathy. There is aortic atherosclerosis. Bones are osteoporotic. There are old healed rib fractures on the right. IMPRESSION: Elevation right hemidiaphragm. Hydropneumothorax on the right, considerably smaller compared to 2 months prior. No tension component. Scarring with volume loss right apex. Left lung clear. Stable cardiac silhouette. There is aortic atherosclerosis. Aortic Atherosclerosis (ICD10-I70.0). Critical Value/emergent results were called by telephone at the time of interpretation on 09/18/2018 at 11:01 am to Dr. Derwood Kaplan , who verbally acknowledged these results. Electronically Signed   By: Bretta Bang III M.D.   On: 09/18/2018 11:01   Dg Hips Bilat W Or Wo Pelvis 3-4 Views  Result Date: 09/18/2018 CLINICAL DATA:  Right hip pain. Possible fall. EXAM: DG HIP (WITH OR WITHOUT PELVIS) 3-4V BILAT COMPARISON:  None. FINDINGS: There is linear lucency in the lateral aspect of the right femoral neck. The left hip has a normal  appearance. Lower lumbar spine degenerative changes. Old, healed left inferior pubic ramus and left ischial fractures. IMPRESSION: Probable nondisplaced right femoral neck fracture. Confirmation with CT or MRI of the hip is recommended. Electronically Signed   By: Beckie Salts M.D.   On: 09/18/2018 09:12    Microbiology: Recent Results (from the past 240 hour(s))  Culture, blood (Routine x 2)     Status: None   Collection Time: 09/29/18  2:05 AM  Result Value Ref Range Status   Specimen Description   Final    BLOOD LEFT ARM Performed at [name], [address] [place], [place] [address]    Special Requests   Final    BOTTLES DRAWN AEROBIC AND ANAEROBIC Blood Culture results may not be optimal due to an excessive volume of blood received in culture bottles Performed at [facility], [address], [place], [place] [address]    Culture NO GROWTH 5 DAYS  Final  Report Status 10/04/2018 FINAL  Final  Urine culture     Status: Abnormal   Collection Time: 09/29/18  2:50 AM  Result Value Ref Range Status   Specimen Description   Final    URINE, CATHETERIZED Performed at [facility], [address] [place], [place] [address]    Special Requests   Final    NONE Performed at [facility], [address] [place], [place] [address]    Culture (A)  Final    <10,000 COLONIES/mL INSIGNIFICANT GROWTH Performed at [facility], [address] [place], [place] [address]    Report Status 09/30/2018 FINAL  Final  Culture, blood (Routine x 2)     Status: None   Collection Time: 09/29/18  2:57 PM  Result Value Ref Range Status   Specimen Description BLOOD RIGHT ANTECUBITAL  Final   Special Requests   Final    BOTTLES DRAWN AEROBIC ONLY Blood Culture adequate volume Performed at [facility], [address] [place], [place] [address]    Culture NO GROWTH 5 DAYS  Final   Report Status 10/04/2018  FINAL  Final     Labs: Basic Metabolic Panel: No results for input(s): NA, K, CL, CO2, GLUCOSE, BUN, CREATININE, CALCIUM, MG, PHOS in the last 168 hours. Liver Function Tests: No results for input(s): AST, ALT, ALKPHOS, BILITOT, PROT, ALBUMIN in the last 168 hours. No results for input(s): LIPASE, AMYLASE in the last 168 hours. No results for input(s): AMMONIA in the last 168 hours. CBC: No results for input(s): WBC, NEUTROABS, HGB, HCT, MCV, PLT in the last 168 hours. Cardiac Enzymes: No results for input(s): CKTOTAL, CKMB, CKMBINDEX, TROPONINI in the last 168 hours. BNP: BNP (last 3 results) No results for input(s): BNP in the last 8760 hours.  ProBNP (last 3 results) No results for input(s): PROBNP in the last 8760 hours.  CBG: No results for input(s): GLUCAP in the last 168 hours.     Signed:  Darlin Droparole N Presli Fanguy, MD [facility] 10/07/2018, 10:48 AM

## 2018-10-07 NOTE — Progress Notes (Addendum)
Patient is able to discharge to [facility] today. CSW notified patients daughter, Meriam Sprague, of discharge to facility. Please call report to [phone].  [facility] will need to provide transportation.   Stacy Gardner, LCSW Clinical Social Worker  [facility]  [phone]

## 2018-10-07 NOTE — Progress Notes (Signed)
[facility])  All necessary paperwork has been completed by LCSW with [facility].  [facility] is ready when ambulance transportation can be arranged.   Please fax discharge summary to [phone].   RN staff:  please call report to [phone].   Thank you, Wallis Bamberg BSN, RN [facility] Liaison (listed in [place]) [phone]

## 2018-10-07 NOTE — Progress Notes (Signed)
Report given to Okey Regal, Charity fundraiser. Pt discharged with all belongings. No s/s of pain or discomfort at time of discharge. Pt's daughter notified by CSW.

## 2019-01-29 IMAGING — CR DG CHEST 2V
3 series · 3 of 3 positions shown · non-contrast
Comparison: 07/06/2018.

CLINICAL DATA: Fell.

EXAM:
CHEST - 2 VIEW

[w chest lat]
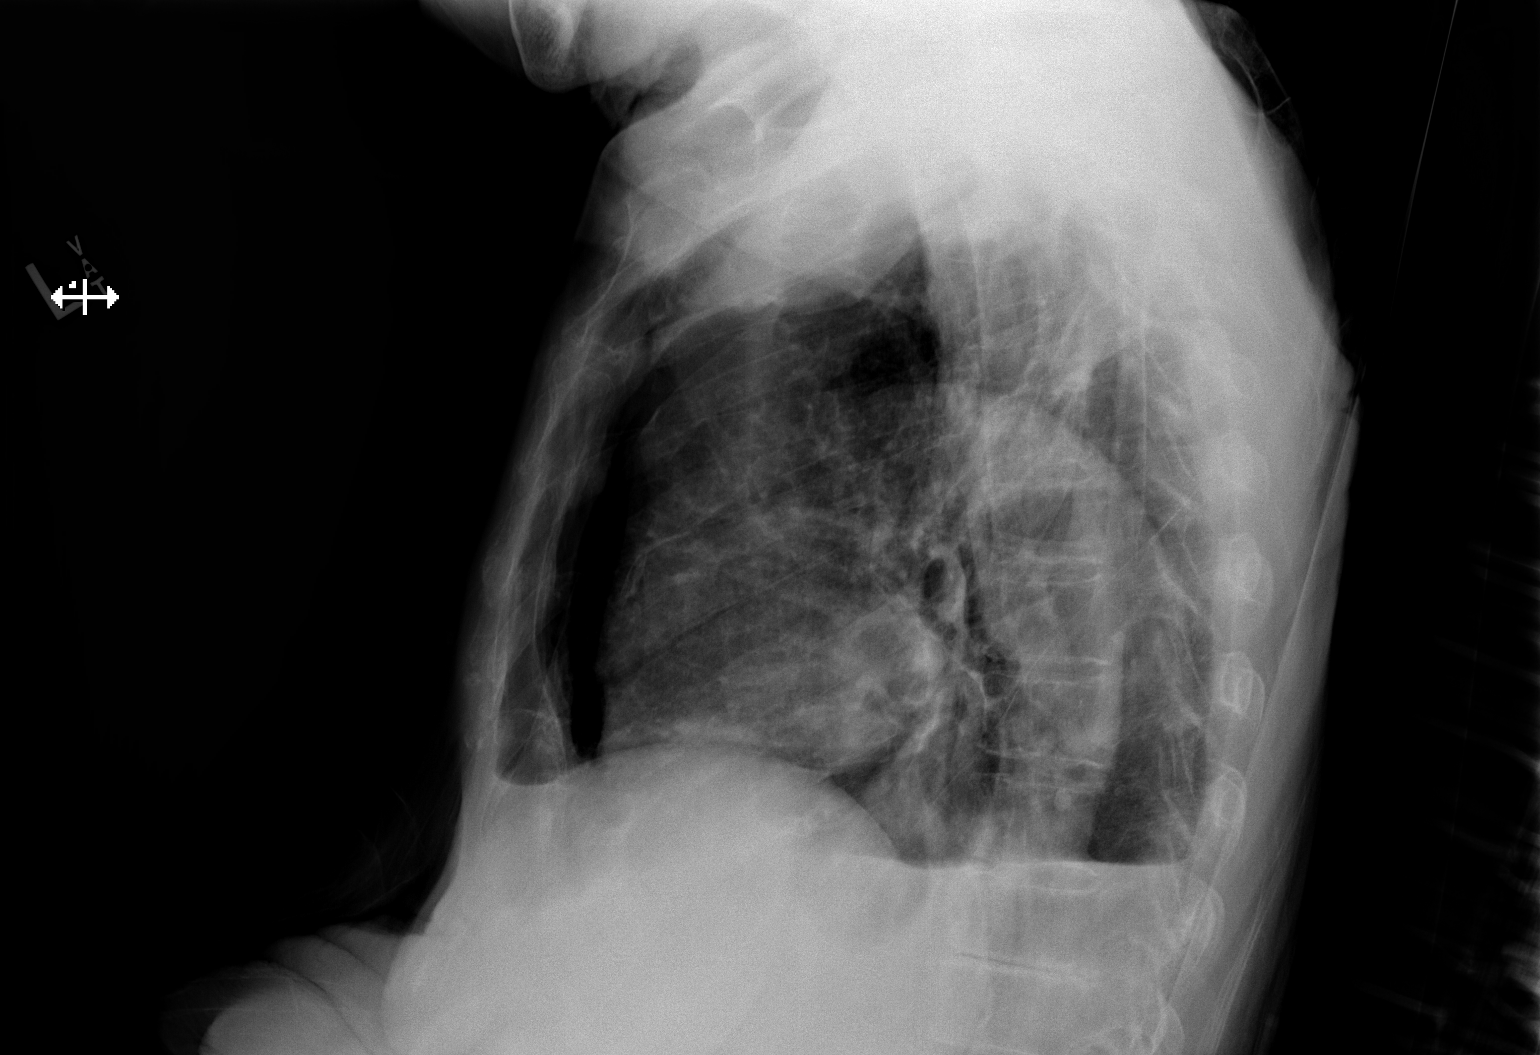

[x chest ap (1 of 2)]
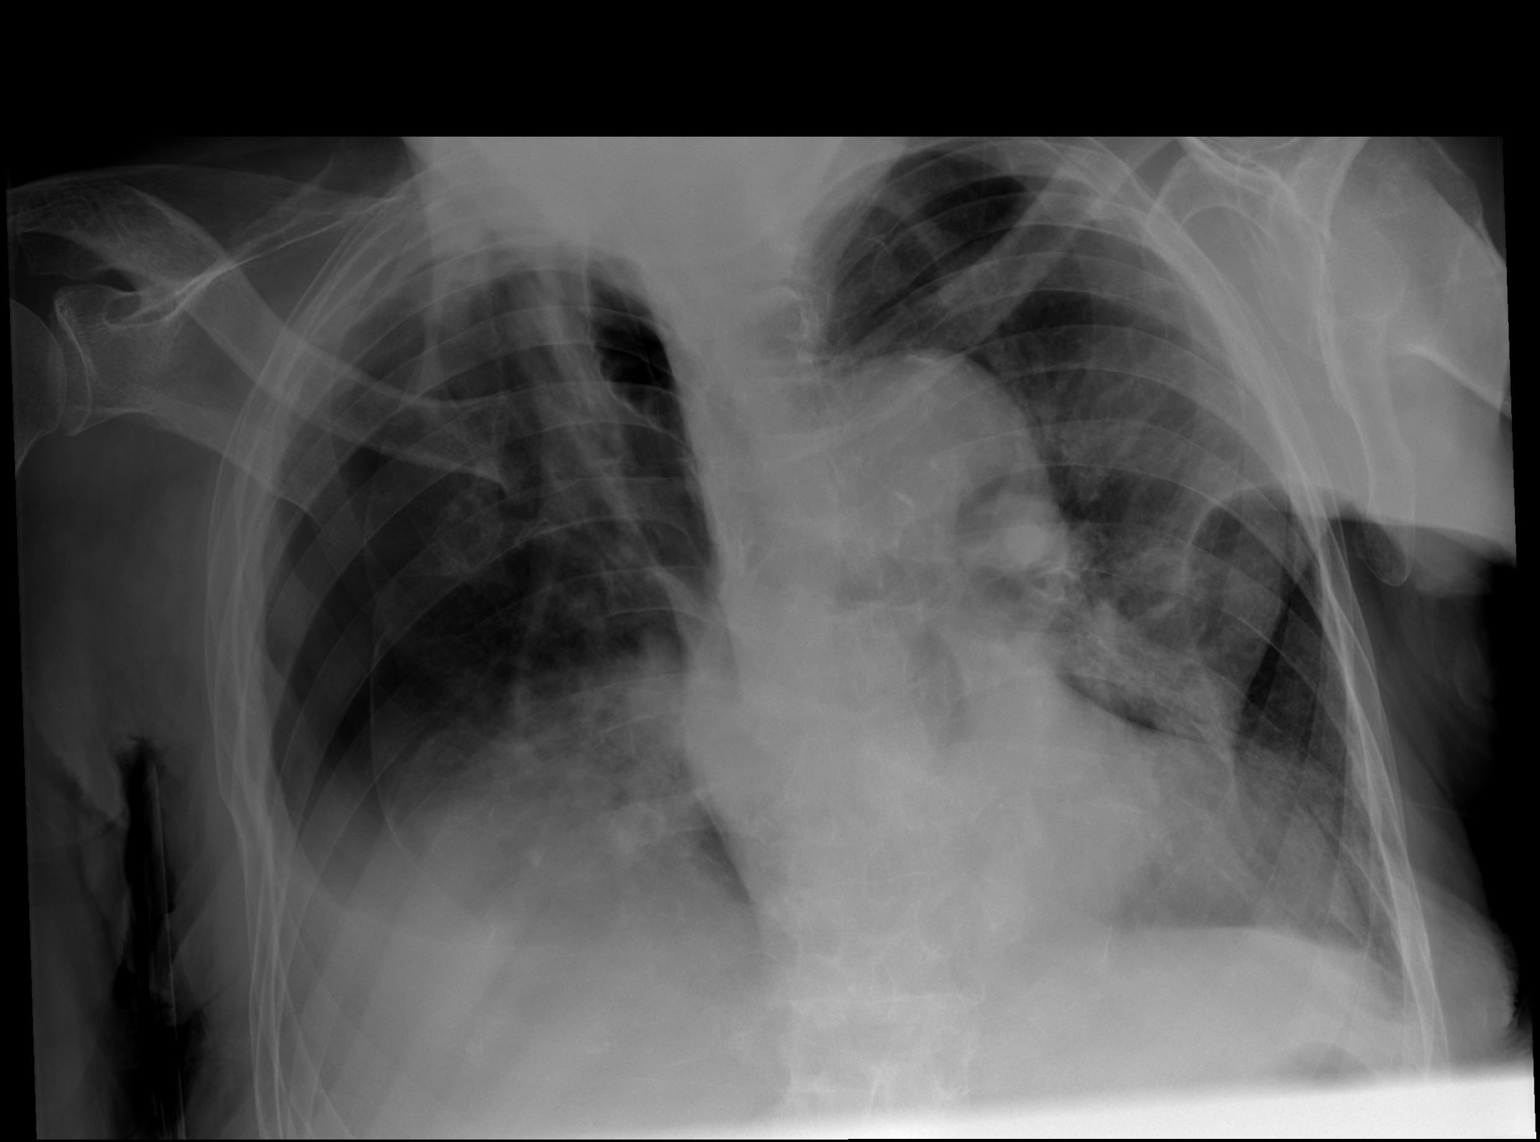

[x chest ap (2 of 2)]
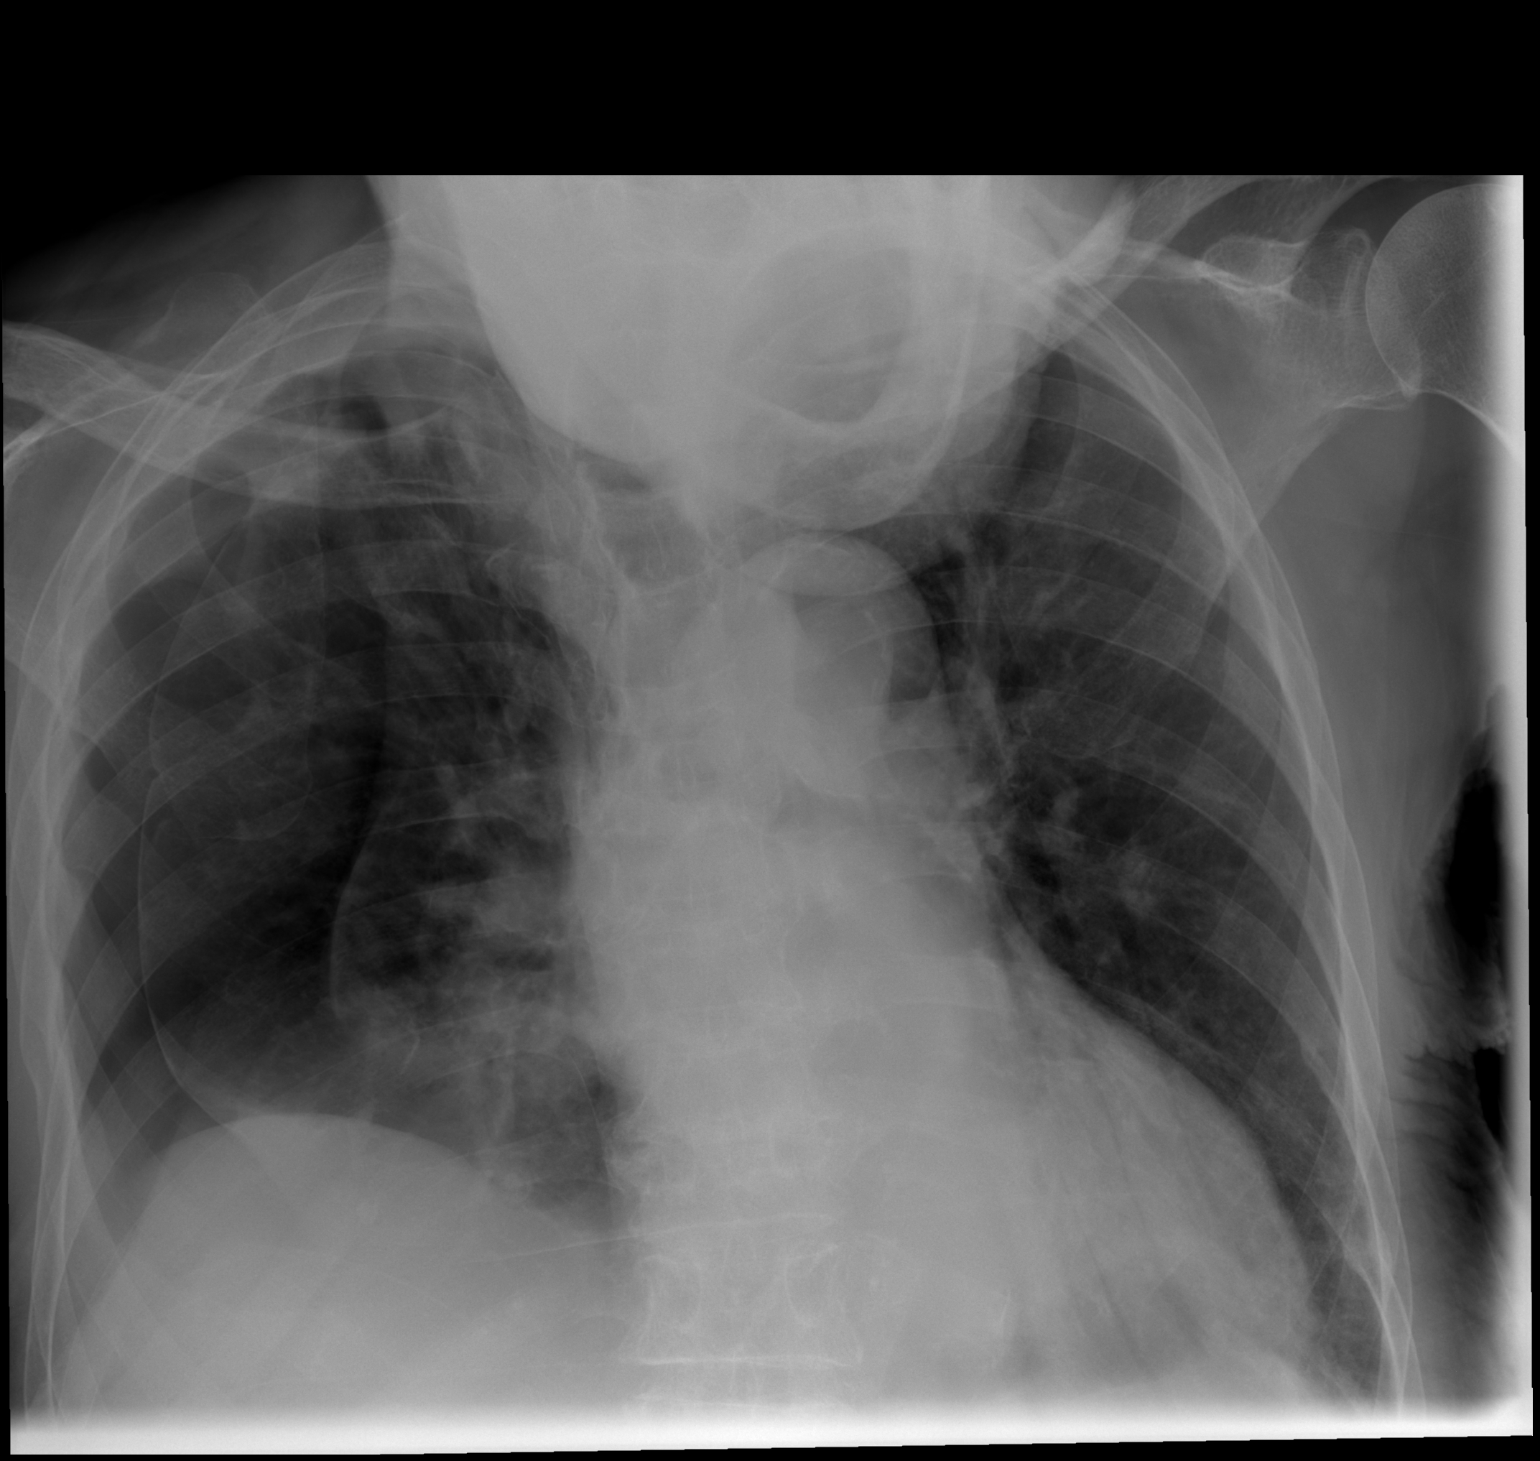

[3 of 3 positions shown; findings below may reference images not displayed]

FINDINGS: The right chest tube has been removed. There is an approximately 30%
right lateral pneumothorax with a small to moderate-sized right
pleural effusion. Patchy opacity is demonstrated in the inferior
right lung and linear density in the upper right lung. The left lung
is clear. Normal sized heart. Tortuous and calcified thoracic aorta.
No interval mediastinal shift. Stable old, healed right lateral rib
fractures with no definite acute fracture visualized.
IMPRESSION: 1. Approximately 30% right hydropneumothorax without interval
mediastinal shift.
2. Patchy atelectasis, pulmonary contusion or pneumonia in the
inferior right lung.
3. Right upper lobe atelectasis.

Critical Value/emergent results were called by telephone at the time
of interpretation on 07/26/2018 at [DATE] to MOOLMAN, KLPIGBB ,
who verbally acknowledged these results.

## 2019-01-29 IMAGING — CT CT MAXILLOFACIAL W/O CM
5 of 10 series · 14 of 47 positions shown, 16 images · non-contrast
Comparison: 06/24/2018 CT

CLINICAL DATA: Pt SHAMIL SAUL from [REDACTED] with a reported fall
landing on her face. Noted swelling on right cheek at this time.
Right orbital swelling, patient unable to give history

EXAM:
CT HEAD WITHOUT CONTRAST
CT MAXILLOFACIAL WITHOUT CONTRAST
CT CERVICAL SPINE WITHOUT CONTRAST
TECHNIQUE: Multidetector CT imaging of the head, cervical spine, and
maxillofacial structures were performed using the standard protocol
without intravenous contrast. Multiplanar CT image reconstructions
of the cervical spine and maxillofacial structures were also
generated.

[Series 3: head wo · axial · 0.42mm/px · z∈[-22,+23]mm · 2 of 29 slices shown]
[im 10/29  bone]
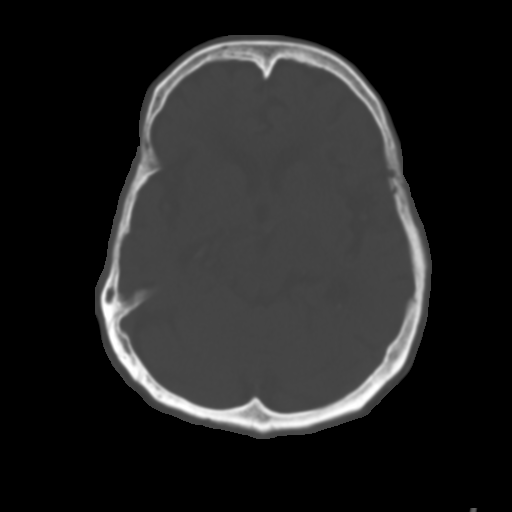
[im 19/29  bone]
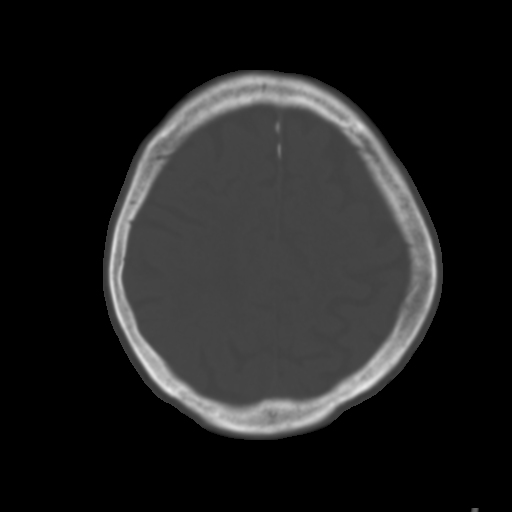

[Series 9: c spine soft · axial · 0.30mm/px · z∈[-181,-137]mm · 3 of 90 slices shown]
[im 12/90  brain]
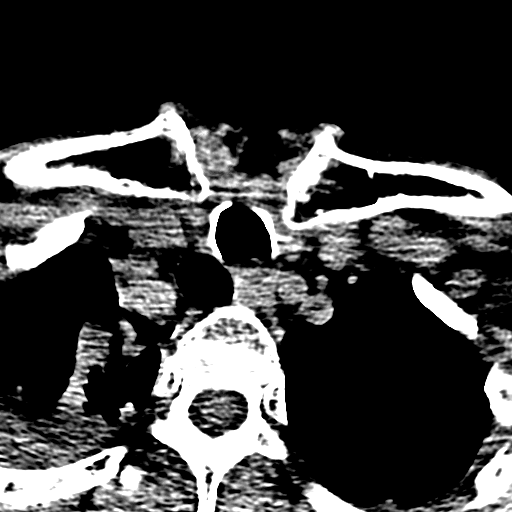
[im 23/90  brain]
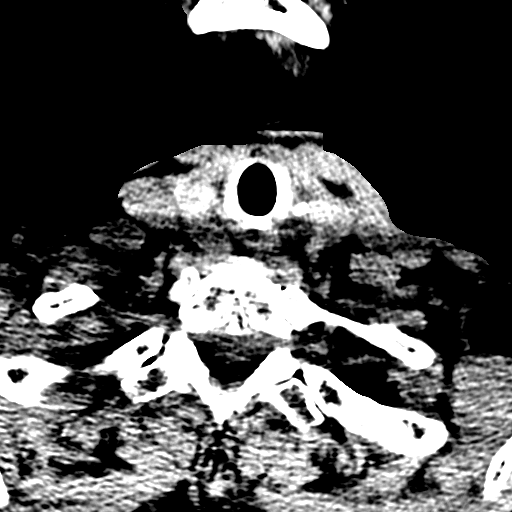
[im 34/90  brain]
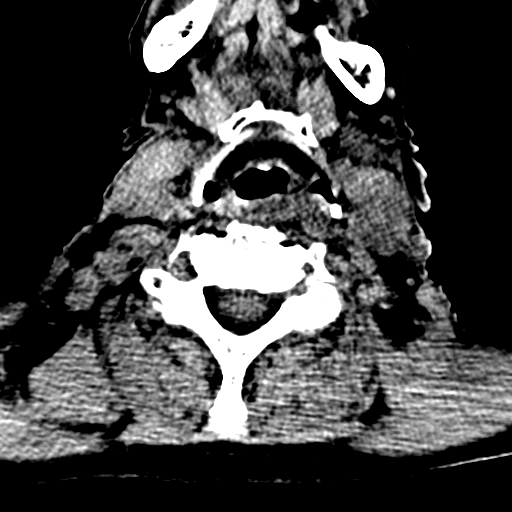

[Series 10: orthogonal axials · axial · 0.23mm/px · z∈[-183,-52]mm · 7 of 89 slices shown, 9 images]
[im 12/89  brain]
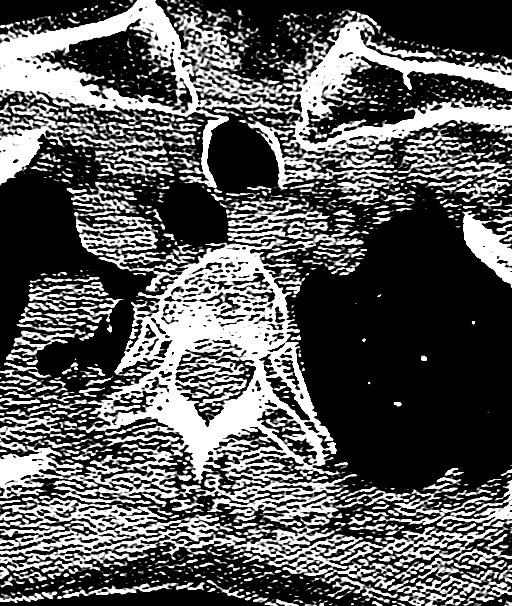
[im 12/89  bone]
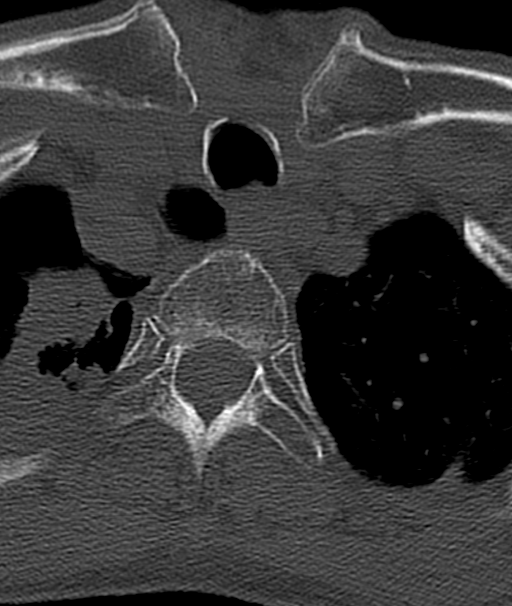
[im 23/89  bone]
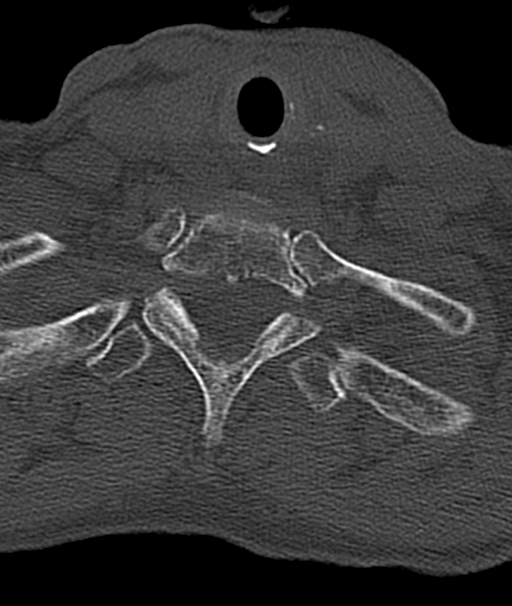
[im 34/89  bone]
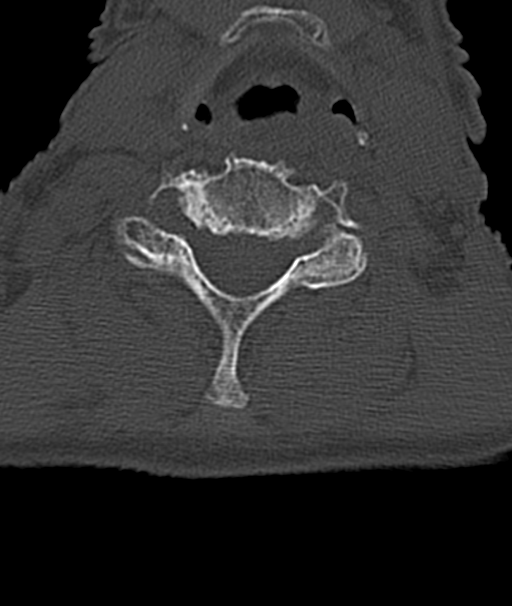
[im 45/89  bone]
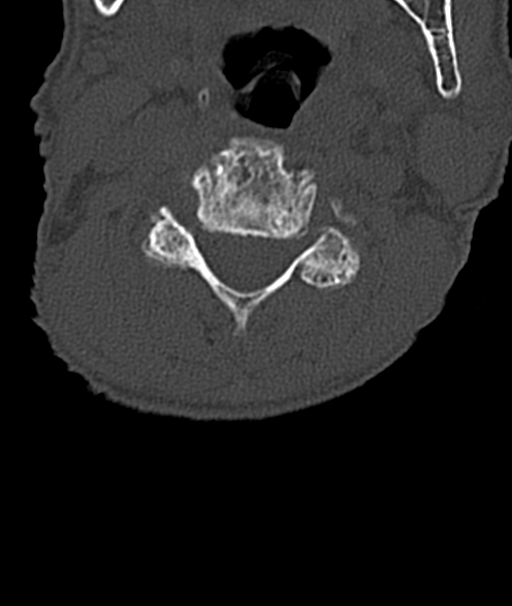
[im 56/89  brain]
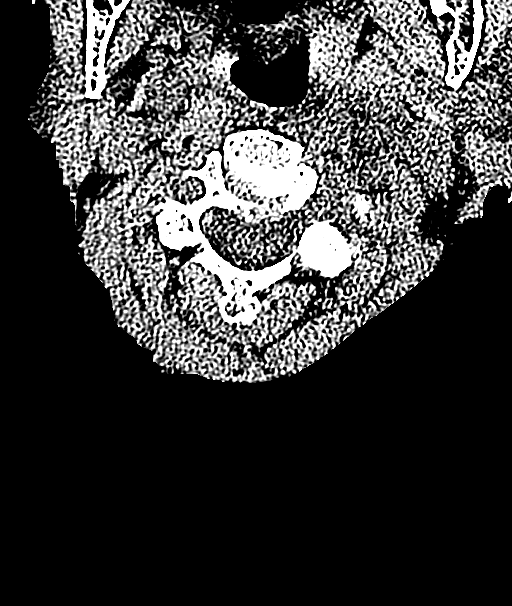
[im 56/89  bone]
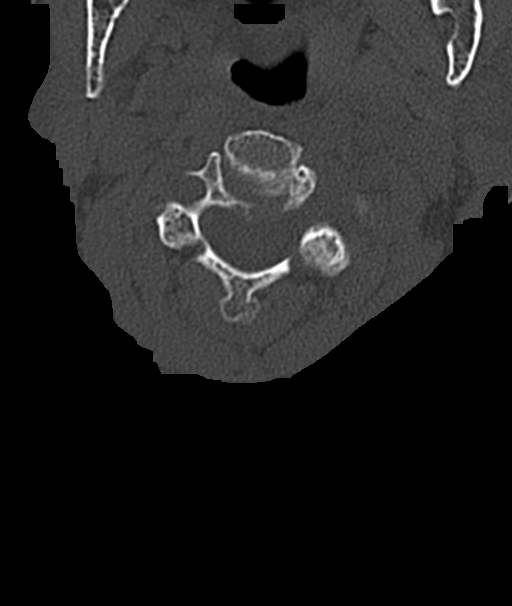
[im 67/89  bone]
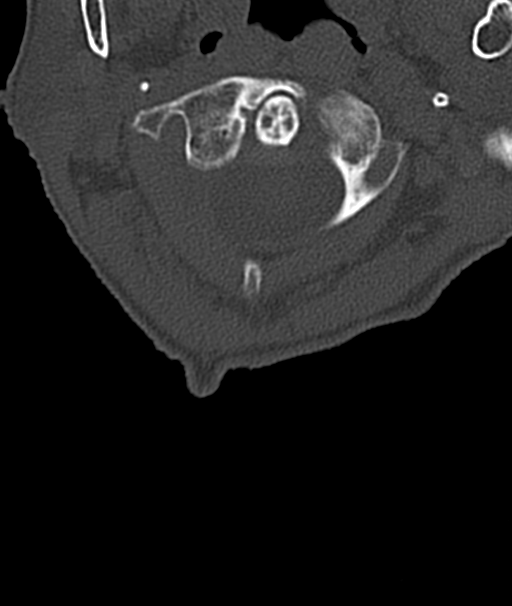
[im 78/89  bone]
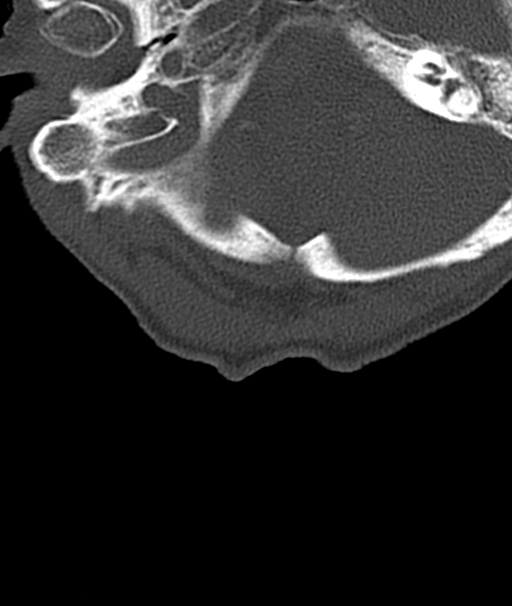

[Series 11: coronal bone · coronal · 0.26mm/px · 1 of 61 slices shown]
[im 31/61  bone]
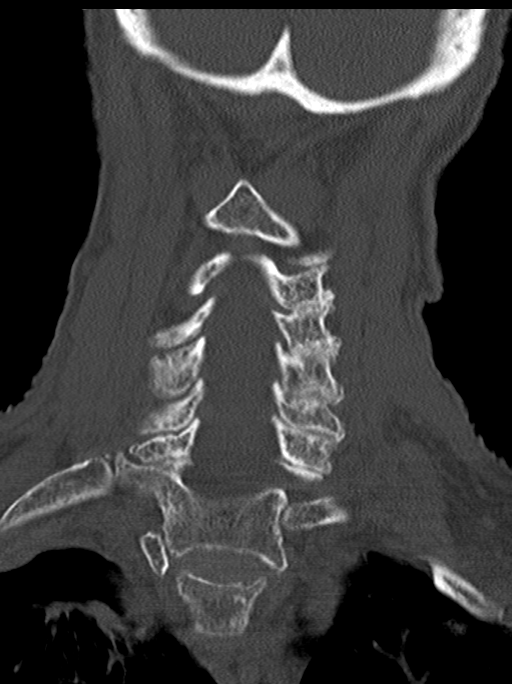

[Series 18: sagittal soft · sagittal · 0.33mm/px · 1 of 72 slices shown]
[im 36/72  bone]
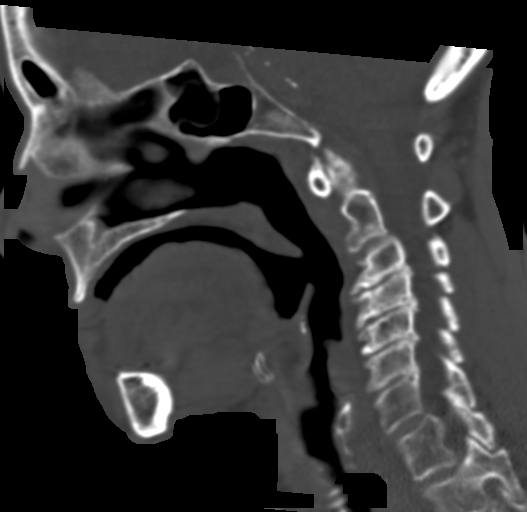

[14 of 47 positions shown; findings below may reference images not displayed]

FINDINGS: CT HEAD FINDINGS

Brain: There is central and cortical atrophy. Significant
periventricular white matter changes are consistent with small
vessel disease. There is no intra or extra-axial fluid collection or
mass lesion. The basilar cisterns and ventricles have a normal
appearance. There is no CT evidence for acute infarction or
hemorrhage.

Vascular: There is dense atherosclerotic calcification of the
internal carotid arteries.

Skull: Normal. Negative for fracture or focal lesion.

Other: None.

CT MAXILLOFACIAL FINDINGS

Osseous: No fracture or mandibular dislocation. No destructive
process.

Orbits: There is preseptal soft tissue swelling involving the RIGHT
orbit. Soft tissue swelling extends superficial to the RIGHT maxilla
and zygomatic arch. The globes are intact bilaterally.

Sinuses: Paranasal sinuses are clear. There are bilateral mastoid
effusions.

Soft tissues: Soft tissue swelling superficial to the RIGHT zygoma
and maxilla; preseptal soft tissue swelling of the RIGHT orbit.

CT CERVICAL SPINE FINDINGS

Alignment: There is reversal of the UPPER cervical lordosis, likely
degenerative in nature.

Skull base and vertebrae: No acute fracture. No primary bone lesion
or focal pathologic process. Remote superior endplate fractures of
T2 and T3, consistent with osteoporosis type injury.

Soft tissues and spinal canal: No prevertebral fluid or swelling. No
visible canal hematoma.

Disc levels:  Significant disc height loss at C3-4, C4-5, C5-6.

Upper chest: There is a RIGHT-sided pneumothorax. RIGHT pleural
effusion and RIGHT apical lung opacity. Further evaluation is
needed.

Other: None
IMPRESSION: 1. RIGHT-sided hydropneumothorax warranting further evaluation.
Consider CT of the chest.
2.  No evidence for acute intracranial abnormality.
3. Atrophy and small vessel disease.
4. Significant soft tissue swelling adjacent to the RIGHT maxilla,
RIGHT zygoma. Preseptal soft tissue swelling of the RIGHT orbit.
Globe is intact.
5. No evidence for acute cervical spine abnormality. Significant mid
cervical spondylosis.

These results were called by telephone at the time of interpretation
on 07/26/2018 at [DATE] to Inggih, PA , who verbally
acknowledged these results.
